# Patient Record
Sex: Male | Born: 1975 | Race: Black or African American | Hispanic: No | Smoking: Current every day smoker
Health system: Southern US, Community
[De-identification: ages and names within clinical notes are randomized; demographics above are authoritative.]

## PROBLEM LIST (undated history)

## (undated) DIAGNOSIS — E119 Type 2 diabetes mellitus without complications: Secondary | ICD-10-CM

## (undated) DIAGNOSIS — F39 Unspecified mood [affective] disorder: Secondary | ICD-10-CM

## (undated) DIAGNOSIS — C259 Malignant neoplasm of pancreas, unspecified: Secondary | ICD-10-CM

## (undated) DIAGNOSIS — K831 Obstruction of bile duct: Secondary | ICD-10-CM

## (undated) DIAGNOSIS — F32A Depression, unspecified: Secondary | ICD-10-CM

## (undated) DIAGNOSIS — F329 Major depressive disorder, single episode, unspecified: Secondary | ICD-10-CM

## (undated) HISTORY — DX: Unspecified mood (affective) disorder: F39

## (undated) HISTORY — PX: OTHER SURGICAL HISTORY: SHX169

## (undated) HISTORY — PX: CHOLECYSTECTOMY: SHX55

---

## 2004-12-21 ENCOUNTER — Emergency Department: Payer: Self-pay | Admitting: Internal Medicine

## 2005-01-30 ENCOUNTER — Emergency Department: Payer: Self-pay | Admitting: Emergency Medicine

## 2005-01-31 ENCOUNTER — Emergency Department: Payer: Self-pay | Admitting: Emergency Medicine

## 2006-12-23 ENCOUNTER — Emergency Department: Payer: Self-pay

## 2007-06-12 ENCOUNTER — Other Ambulatory Visit: Payer: Self-pay

## 2007-06-12 ENCOUNTER — Emergency Department: Payer: Self-pay | Admitting: Emergency Medicine

## 2009-04-19 ENCOUNTER — Emergency Department: Payer: Self-pay | Admitting: Emergency Medicine

## 2016-11-16 ENCOUNTER — Encounter: Payer: Self-pay | Admitting: Pulmonary Disease

## 2016-11-16 ENCOUNTER — Ambulatory Visit (INDEPENDENT_AMBULATORY_CARE_PROVIDER_SITE_OTHER): Payer: PRIVATE HEALTH INSURANCE | Admitting: Pulmonary Disease

## 2016-11-16 VITALS — BP 118/70 | HR 80 | Ht 68.0 in | Wt 204.8 lb

## 2016-11-16 DIAGNOSIS — R0683 Snoring: Secondary | ICD-10-CM | POA: Diagnosis not present

## 2016-11-16 NOTE — Patient Instructions (Signed)
Can look up theravent nasal patches Can try positional therapy for snoring Can ask your dentist about an oral appliance to treat snoring Can call our office if you need a referral to a dentist or ENT surgeon  Follow up as needed

## 2016-11-16 NOTE — Progress Notes (Signed)
   Subjective:    Patient ID: Travis Hawkins, male    DOB: 1975/07/24, 41 y.o.   MRN: [id number]  HPI    Review of Systems  Constitutional: Negative for fever and unexpected weight change.  HENT: Negative for congestion, dental problem, ear pain, nosebleeds, postnasal drip, rhinorrhea, sinus pressure, sneezing, sore throat and trouble swallowing.   Eyes: Negative for redness and itching.  Respiratory: Negative for cough, chest tightness, shortness of breath and wheezing.   Cardiovascular: Negative for palpitations and leg swelling.  Gastrointestinal: Negative for nausea and vomiting.  Genitourinary: Negative for dysuria.  Musculoskeletal: Negative for joint swelling.  Skin: Negative for rash.  Allergic/Immunologic: Negative.  Negative for environmental allergies, food allergies and immunocompromised state.  Neurological: Negative for headaches.  Hematological: Does not bruise/bleed easily.  Psychiatric/Behavioral: Positive for dysphoric mood. The patient is not nervous/anxious.        Objective:   Physical Exam        Assessment & Plan:

## 2016-11-16 NOTE — Progress Notes (Signed)
Past Surgical History He  has no past surgical history on file.  Not on File  Family History His family history includes Breast cancer in his maternal grandmother; Cancer in his maternal aunt; Lung cancer in his maternal grandfather.  Social History He  reports that he quit smoking about 2 years ago. His smoking use included Cigarettes. He has a 18.00 pack-year smoking history. He has never used smokeless tobacco. He reports that he does not use drugs.  Review of systems Constitutional: Negative for fever and unexpected weight change.  HENT: Negative for congestion, dental problem, ear pain, nosebleeds, postnasal drip, rhinorrhea, sinus pressure, sneezing, sore throat and trouble swallowing.   Eyes: Negative for redness and itching.  Respiratory: Negative for cough, chest tightness, shortness of breath and wheezing.   Cardiovascular: Negative for palpitations and leg swelling.  Gastrointestinal: Negative for nausea and vomiting.  Genitourinary: Negative for dysuria.  Musculoskeletal: Negative for joint swelling.  Skin: Negative for rash.  Allergic/Immunologic: Negative.  Negative for environmental allergies, food allergies and immunocompromised state.  Neurological: Negative for headaches.  Hematological: Does not bruise/bleed easily.  Psychiatric/Behavioral: Positive for dysphoric mood. The patient is not nervous/anxious.     No current outpatient prescriptions on file prior to visit.   No current facility-administered medications on file prior to visit.     Chief Complaint  Patient presents with  . Advice Only    Pt states that his fiance has been snoring and breathing heavily x3 years.    Past medical history He  has a past medical history of Mood disorder ([place]).  Vital signs BP 118/70 (BP Location: Right Arm, Patient Position: Sitting, Cuff Size: Normal)   Pulse 80   Ht 5\' 8"  (1.727 m)   Wt 204 lb 12.8 oz (92.9 kg)   SpO2 99%   BMI 31.14 kg/m   History of Present  Illness Travis Hawkins is a 41 y.o. male for evaluation of snoring.  His finance has been concerned about his snoring.  He doesn't feel like he has an issue with his sleep or his breathing while asleep.  He likes to sleep on his back.  He doesn't get dry mouth.  He denies sinus issues or allergies.  He doesn't have any trouble staying focused or awake during the day.  He goes to sleep at 12 am.  He falls asleep in 10 minutes.  He usually sleeps through the night, but sometimes gets up to use the bathroom.  He gets out of bed at 9 am.  He feels rested in the morning.  He denies morning headache.  He does not use anything to help him fall sleep or stay awake.  He denies sleep walking, sleep talking, bruxism, or nightmares.  There is no history of restless legs.  He denies sleep hallucinations, sleep paralysis, or cataplexy.  The Epworth score is 16 out of 24.  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   Physical Exam:  General - No distress ENT - No sinus tenderness, no oral exudate, no LAN, no thyromegaly, TM clear, pupils equal/reactive, MP 3, enlarged tongue, elongated uvula Cardiac - s1s2 regular, no murmur, pulses symmetric Chest - No wheeze/rales/dullness, good air entry, normal respiratory excursion Back - No focal tenderness Abd - Soft, non-tender, no organomegaly, + bowel sounds Ext - No edema Neuro - Normal strength, cranial nerves intact Skin - No rashes Psych - Normal mood, and behavior  Discussion: His fiance has been concerned about his snoring.  He doesn't feel like he has an issue with his sleep.  Assessment/plan:  Snoring. - discussed techniques to help with snoring >> positional therapy, oral appliances, theravent, surgical assessment - explained that insurance would not likely cover therapy w/o a formal diagnosis of sleep apnea - he does not think he has a significant sleep issue and would  like to defer additional sleep testing at this time - discussed how sleeping in separate rooms to ensure adequate sleep for both members of relation can actually better to maintain the health of a relationship    Patient Instructions  Can look up theravent nasal patches Can try positional therapy for snoring Can ask your dentist about an oral appliance to treat snoring Can call our office if you need a referral to a dentist or ENT surgeon  Follow up as needed    Chesley Mires, M.D. Pager [phone] 11/16/2016, 4:45 PM

## 2017-03-08 ENCOUNTER — Encounter: Payer: Self-pay | Admitting: *Deleted

## 2017-03-08 ENCOUNTER — Other Ambulatory Visit: Payer: Self-pay

## 2017-03-08 DIAGNOSIS — K851 Biliary acute pancreatitis without necrosis or infection: Secondary | ICD-10-CM | POA: Diagnosis present

## 2017-03-08 DIAGNOSIS — Z6831 Body mass index (BMI) 31.0-31.9, adult: Secondary | ICD-10-CM

## 2017-03-08 DIAGNOSIS — E669 Obesity, unspecified: Secondary | ICD-10-CM | POA: Diagnosis present

## 2017-03-08 DIAGNOSIS — Z833 Family history of diabetes mellitus: Secondary | ICD-10-CM

## 2017-03-08 DIAGNOSIS — L299 Pruritus, unspecified: Secondary | ICD-10-CM | POA: Diagnosis present

## 2017-03-08 DIAGNOSIS — Z87891 Personal history of nicotine dependence: Secondary | ICD-10-CM

## 2017-03-08 DIAGNOSIS — K8051 Calculus of bile duct without cholangitis or cholecystitis with obstruction: Principal | ICD-10-CM | POA: Diagnosis present

## 2017-03-08 DIAGNOSIS — F39 Unspecified mood [affective] disorder: Secondary | ICD-10-CM | POA: Diagnosis present

## 2017-03-08 DIAGNOSIS — R634 Abnormal weight loss: Secondary | ICD-10-CM | POA: Diagnosis present

## 2017-03-08 DIAGNOSIS — R7301 Impaired fasting glucose: Secondary | ICD-10-CM | POA: Diagnosis present

## 2017-03-08 DIAGNOSIS — N400 Enlarged prostate without lower urinary tract symptoms: Secondary | ICD-10-CM | POA: Diagnosis present

## 2017-03-08 LAB — COMPREHENSIVE METABOLIC PANEL
ALBUMIN: 4 g/dL (ref 3.5–5.0)
ALK PHOS: 258 U/L — AB (ref 38–126)
ALT: 347 U/L — AB (ref 17–63)
AST: 144 U/L — ABNORMAL HIGH (ref 15–41)
Anion gap: 10 (ref 5–15)
BUN: 9 mg/dL (ref 6–20)
CALCIUM: 9.3 mg/dL (ref 8.9–10.3)
CO2: 21 mmol/L — AB (ref 22–32)
CREATININE: 0.65 mg/dL (ref 0.61–1.24)
Chloride: 104 mmol/L (ref 101–111)
GFR calc Af Amer: >60 mL/min (ref >60)
GFR calc non Af Amer: >60 mL/min (ref >60)
GLUCOSE: 131 mg/dL — AB (ref 65–99)
Potassium: 3.8 mmol/L (ref 3.5–5.1)
SODIUM: 135 mmol/L (ref 135–145)
Total Bilirubin: 10.2 mg/dL — ABNORMAL HIGH (ref 0.3–1.2)
Total Protein: 8.1 g/dL (ref 6.5–8.1)

## 2017-03-08 LAB — URINALYSIS, COMPLETE (UACMP) WITH MICROSCOPIC
Bacteria, UA: NONE SEEN
Bilirubin Urine: NEGATIVE
GLUCOSE, UA: NEGATIVE mg/dL
Ketones, ur: NEGATIVE mg/dL
Leukocytes, UA: NEGATIVE
Nitrite: NEGATIVE
PROTEIN: NEGATIVE mg/dL
Specific Gravity, Urine: 1.005 (ref 1.005–1.030)
Squamous Epithelial / LPF: NONE SEEN
pH: 6 (ref 5.0–8.0)

## 2017-03-08 LAB — CBC
HCT: 41.5 % (ref 40.0–52.0)
Hemoglobin: 13.9 g/dL (ref 13.0–18.0)
MCH: 32.7 pg (ref 26.0–34.0)
MCHC: 33.6 g/dL (ref 32.0–36.0)
MCV: 97.4 fL (ref 80.0–100.0)
PLATELETS: 220 10*3/uL (ref 150–440)
RBC: 4.26 MIL/uL — ABNORMAL LOW (ref 4.40–5.90)
RDW: 15.5 % — AB (ref 11.5–14.5)
WBC: 6.5 10*3/uL (ref 3.8–10.6)

## 2017-03-08 LAB — LIPASE, BLOOD: Lipase: 619 U/L — ABNORMAL HIGH (ref 11–51)

## 2017-03-08 NOTE — ED Triage Notes (Signed)
Pt to ED reporting multiple complaints. Pt reports having dark urine with noted blood at times. Pt reports he has had generalized abd pain and increased number of BM for the past month with weakness and nausea. pT reports he has become very sensitive to smells and over the past month will become nauseous with strong smells. Pt also report shaving itching skin that worsens at night. Only change int he last month that pt reports is a change from brand name Zyprexa to generic a month ago.

## 2017-03-09 ENCOUNTER — Emergency Department: Payer: Self-pay

## 2017-03-09 ENCOUNTER — Inpatient Hospital Stay
Admission: EM | Admit: 2017-03-09 | Discharge: 2017-03-12 | DRG: 444 | Disposition: A | Discharge status: Home / Self Care | Payer: PRIVATE HEALTH INSURANCE | Attending: Internal Medicine | Admitting: Internal Medicine

## 2017-03-09 ENCOUNTER — Inpatient Hospital Stay: Payer: Self-pay

## 2017-03-09 ENCOUNTER — Other Ambulatory Visit: Payer: Self-pay

## 2017-03-09 DIAGNOSIS — R945 Abnormal results of liver function studies: Secondary | ICD-10-CM

## 2017-03-09 DIAGNOSIS — K831 Obstruction of bile duct: Secondary | ICD-10-CM

## 2017-03-09 DIAGNOSIS — K8050 Calculus of bile duct without cholangitis or cholecystitis without obstruction: Secondary | ICD-10-CM

## 2017-03-09 DIAGNOSIS — K838 Other specified diseases of biliary tract: Secondary | ICD-10-CM

## 2017-03-09 DIAGNOSIS — R17 Unspecified jaundice: Secondary | ICD-10-CM

## 2017-03-09 DIAGNOSIS — R7989 Other specified abnormal findings of blood chemistry: Secondary | ICD-10-CM

## 2017-03-09 DIAGNOSIS — R748 Abnormal levels of other serum enzymes: Secondary | ICD-10-CM

## 2017-03-09 LAB — COMPREHENSIVE METABOLIC PANEL
ALT: 325 U/L — ABNORMAL HIGH (ref 17–63)
AST: 136 U/L — AB (ref 15–41)
Albumin: 3.9 g/dL (ref 3.5–5.0)
Alkaline Phosphatase: 252 U/L — ABNORMAL HIGH (ref 38–126)
Anion gap: 9 (ref 5–15)
BUN: 8 mg/dL (ref 6–20)
CHLORIDE: 105 mmol/L (ref 101–111)
CO2: 22 mmol/L (ref 22–32)
Calcium: 9.1 mg/dL (ref 8.9–10.3)
Creatinine, Ser: 0.72 mg/dL (ref 0.61–1.24)
GFR calc Af Amer: >60 mL/min (ref >60)
Glucose, Bld: 168 mg/dL — ABNORMAL HIGH (ref 65–99)
POTASSIUM: 3.7 mmol/L (ref 3.5–5.1)
Sodium: 136 mmol/L (ref 135–145)
Total Bilirubin: 10.2 mg/dL — ABNORMAL HIGH (ref 0.3–1.2)
Total Protein: 7.7 g/dL (ref 6.5–8.1)

## 2017-03-09 LAB — CBC
HCT: 40.5 % (ref 40.0–52.0)
Hemoglobin: 13.8 g/dL (ref 13.0–18.0)
MCH: 33 pg (ref 26.0–34.0)
MCHC: 34.1 g/dL (ref 32.0–36.0)
MCV: 96.9 fL (ref 80.0–100.0)
PLATELETS: 212 10*3/uL (ref 150–440)
RBC: 4.18 MIL/uL — ABNORMAL LOW (ref 4.40–5.90)
RDW: 15.4 % — AB (ref 11.5–14.5)
WBC: 5.6 10*3/uL (ref 3.8–10.6)

## 2017-03-09 LAB — BILIRUBIN, DIRECT: BILIRUBIN DIRECT: 6.3 mg/dL — AB (ref 0.1–0.5)

## 2017-03-09 LAB — PROTIME-INR
INR: 1.03
PROTHROMBIN TIME: 13.4 s (ref 11.4–15.2)

## 2017-03-09 LAB — APTT: APTT: 31 s (ref 24–36)

## 2017-03-09 MED ORDER — SODIUM CHLORIDE 0.9% FLUSH
3.0000 mL | INTRAVENOUS | Status: DC | PRN
  Administered 2017-03-09 (×2): 3 mL via INTRAVENOUS
  Filled 2017-03-09 (×2): qty 3

## 2017-03-09 MED ORDER — MORPHINE SULFATE (PF) 2 MG/ML IV SOLN
2.0000 mg | INTRAVENOUS | Status: DC | PRN
  Administered 2017-03-09 – 2017-03-12 (×2): 2 mg via INTRAVENOUS
  Filled 2017-03-09: qty 1

## 2017-03-09 MED ORDER — ONDANSETRON HCL 4 MG PO TABS: 4.0000 mg | ORAL_TABLET | Freq: Four times a day (QID) | ORAL | Status: DC | PRN

## 2017-03-09 MED ORDER — OLANZAPINE 10 MG PO TABS
10.0000 mg | ORAL_TABLET | Freq: Every day | ORAL | Status: DC
  Administered 2017-03-09 – 2017-03-11 (×3): 10 mg via ORAL
  Filled 2017-03-09 (×4): qty 1

## 2017-03-09 MED ORDER — GADOBENATE DIMEGLUMINE 529 MG/ML IV SOLN
18.0000 mL | Freq: Once | INTRAVENOUS | Status: AC | PRN
  Administered 2017-03-09: 18 mL via INTRAVENOUS

## 2017-03-09 MED ORDER — LAMOTRIGINE 25 MG PO TABS
50.0000 mg | ORAL_TABLET | Freq: Every day | ORAL | Status: DC
  Administered 2017-03-09 – 2017-03-11 (×3): 50 mg via ORAL
  Filled 2017-03-09 (×3): qty 2

## 2017-03-09 MED ORDER — SODIUM CHLORIDE 0.9% FLUSH
3.0000 mL | Freq: Two times a day (BID) | INTRAVENOUS | Status: DC
  Administered 2017-03-09 – 2017-03-11 (×2): 3 mL via INTRAVENOUS

## 2017-03-09 MED ORDER — HYDROXYZINE HCL 25 MG PO TABS
25.0000 mg | ORAL_TABLET | Freq: Three times a day (TID) | ORAL | Status: DC | PRN
  Administered 2017-03-09 – 2017-03-11 (×4): 25 mg via ORAL
  Filled 2017-03-09 (×4): qty 1

## 2017-03-09 MED ORDER — OXYCODONE HCL 5 MG PO TABS
5.0000 mg | ORAL_TABLET | ORAL | Status: DC | PRN
  Administered 2017-03-11: 5 mg via ORAL
  Filled 2017-03-09: qty 1

## 2017-03-09 MED ORDER — IOPAMIDOL (ISOVUE-300) INJECTION 61%
100.0000 mL | Freq: Once | INTRAVENOUS | Status: AC | PRN
  Administered 2017-03-09: 100 mL via INTRAVENOUS

## 2017-03-09 MED ORDER — ONDANSETRON HCL 4 MG/2ML IJ SOLN
4.0000 mg | Freq: Four times a day (QID) | INTRAMUSCULAR | Status: DC | PRN
  Administered 2017-03-12: 4 mg via INTRAVENOUS
  Filled 2017-03-09: qty 2

## 2017-03-09 MED ORDER — SODIUM CHLORIDE 0.9 % IV SOLN
INTRAVENOUS | Status: DC
  Administered 2017-03-09 – 2017-03-12 (×6): via INTRAVENOUS

## 2017-03-09 MED ORDER — SODIUM CHLORIDE 0.9 % IV SOLN: 250.0000 mL | INTRAVENOUS | Status: DC | PRN

## 2017-03-09 MED ORDER — MORPHINE SULFATE (PF) 2 MG/ML IV SOLN
INTRAVENOUS | Status: AC
  Filled 2017-03-09: qty 1

## 2017-03-09 MED ORDER — DOCUSATE SODIUM 100 MG PO CAPS
100.0000 mg | ORAL_CAPSULE | Freq: Two times a day (BID) | ORAL | Status: DC
  Administered 2017-03-09 – 2017-03-11 (×4): 100 mg via ORAL
  Filled 2017-03-09 (×5): qty 1

## 2017-03-09 MED ORDER — ENOXAPARIN SODIUM 40 MG/0.4ML ~~LOC~~ SOLN
40.0000 mg | SUBCUTANEOUS | Status: DC
  Administered 2017-03-09 – 2017-03-10 (×2): 40 mg via SUBCUTANEOUS
  Filled 2017-03-09 (×2): qty 0.4

## 2017-03-09 NOTE — ED Notes (Signed)
ED Provider at bedside. 

## 2017-03-09 NOTE — ED Notes (Signed)
Patient transported to CT 

## 2017-03-09 NOTE — Consult Note (Addendum)
Travis Hawkins , MD [address] [place], [place], [place], [address] [address], [place], [place], [place], [address] Phone: [phone]  Fax: [phone]  Consultation  Referring Provider:   Dr Karma Greaser Primary Care Physician:  Patient, No Pcp Per Primary Gastroenterologist: None          Reason for Consultation:     Abnormal LFT's   Date of Admission:  03/09/2017 Date of Consultation:  03/09/2017         HPI:   Travis Hawkins is a 41 y.o. male presented to the ER last night with abdominal pain nausea and vomiting.  He also noted that he was turning yellow gradually with itching and that his urine has been turning gradually darker in color.  In the ER he initially had a ultrasound of his right upper quadrant which demonstrated no gallbladder stones but a dilated gallbladder and marked intrahepatic and extrahepatic biliary dilation was noted.  He subsequently had a CAT scan which showed his common bile duct was dilated to 15 mm to the level of the pancreatic head dilation of the intrahepatic bile ducts gallbladder distention and a mildly enlarged prostate.  Dr. Marijean Bravo back from the ER discussed his case with me last night and I suggested that he be admitted and obtain an MRCP in the morning which he subsequently did and again shows intrahepatic and extrahepatic biliary dilation without any identifiable cause, the pancreas appears unremarkable.  On admission his INR was 1.03 had an elevated bilirubin direct of 6.3 hemoglobin is 13.8 g with a platelet count of 212.  His AST and ALT are elevated at 136 and 325 respectively he does have an elevated alkaline phosphatase of 252 creatinine is normal at 0.72  Over-the-counter medications: NO  Herbal supplements: NO  Family history of liver disease: NO  Alcohol: NO  Illegal drug use: NO   He says over the last 1 month has been turning yellow, dark urine last 3 weeks,regular brown colored stool, itching last few weeks, 10 lbs weight loss,  denies any abdominal pain says its more of a sensation that he wants to have a bowel movement allthe time .   Past Medical History:  Diagnosis Date  . Mood disorder [facility])     Past Surgical History:  Procedure Laterality Date  . none      Prior to Admission medications   Medication Sig Start Date End Date Taking? Authorizing Provider  lamoTRIgine (LAMICTAL) 25 MG tablet Take 25 mg by mouth at bedtime. Two tabs at bedtime.   Yes [provider]  OLANZapine (ZYPREXA) 10 MG tablet Take 10 mg by mouth at bedtime.   Yes [provider]    Family History  Problem Relation Age of Onset  . Cancer Maternal Aunt   . Breast cancer Maternal Grandmother   . Lung cancer Maternal Grandfather   . Diabetes Mellitus II Mother      Social History   Tobacco Use  . Smoking status: Former Smoker    Packs/day: 1.00    Years: 18.00    Pack years: 18.00    Types: Cigarettes    Last attempt to quit: 11/17/2014    Years since quitting: 2.3  . Smokeless tobacco: Never Used  Substance Use Topics  . Alcohol use: Yes    Comment: Occasional wine about every 3-4 months  . Drug use: No    Allergies as of 03/08/2017  . (Not on File)    Review of Systems:  All systems reviewed and negative except where noted in HPI.   Physical Exam:  Vital signs in last 24 hours: Temp:  [97.5 F (36.4 C)-98.3 F (36.8 C)] 97.5 F (36.4 C) (11/10 0750) Pulse Rate:  [62-70] 69 (11/10 0451) Resp:  [14-17] 16 (11/10 0750) BP: (108-131)/(54-90) 114/68 (11/10 0750) SpO2:  [96 %-100 %] 100 % (11/10 0750) Weight:  [199 lb 1.6 oz (90.3 kg)-204 lb (92.5 kg)] 199 lb 1.6 oz (90.3 kg) (11/10 0454) Last BM Date: 03/09/17 General:   Pleasant, cooperative in NAD Head:  Normocephalic and atraumatic. Eyes:   +++icterus.   Conjunctiva pink. PERRLA. Ears:  Normal auditory acuity. Neck:  Supple; no masses or thyroidomegaly Lungs: Respirations even and unlabored. Lungs clear to auscultation bilaterally.    No wheezes, crackles, or rhonchi.  Heart:  Regular rate and rhythm;  Without murmur, clicks, rubs or gallops Abdomen:  Soft, nondistended, nontender. Normal bowel sounds. No appreciable masses or hepatomegaly.  No rebound or guarding.  Neurologic:  Alert and oriented x3;  grossly normal neurologically. Skin:  Intact without significant lesions or rashes. Cervical Nodes:  No significant cervical adenopathy. Psych:  Alert and cooperative. Normal affect.  LAB RESULTS: Recent Labs    03/08/17 2233 03/09/17 0516  WBC 6.5 5.6  HGB 13.9 13.8  HCT 41.5 40.5  PLT 220 212   BMET Recent Labs    03/08/17 2233 03/09/17 0516  NA 135 136  K 3.8 3.7  CL 104 105  CO2 21* 22  GLUCOSE 131* 168*  BUN 9 8  CREATININE 0.65 0.72  CALCIUM 9.3 9.1   LFT Recent Labs    03/09/17 0106 03/09/17 0516  PROT  --  7.7  ALBUMIN  --  3.9  AST  --  136*  ALT  --  325*  ALKPHOS  --  252*  BILITOT  --  10.2*  BILIDIR 6.3*  --    PT/INR Recent Labs    03/09/17 0106  LABPROT 13.4  INR 1.03    STUDIES: Ct Abdomen Pelvis W Contrast  Result Date: 03/09/2017 CLINICAL DATA:  Chronic generalized abdominal pain and nausea. Lightheadedness and generalized weakness. Itchiness. EXAM: CT ABDOMEN AND PELVIS WITH CONTRAST TECHNIQUE: Multidetector CT imaging of the abdomen and pelvis was performed using the standard protocol following bolus administration of intravenous contrast. CONTRAST:  152mL ISOVUE-300 IOPAMIDOL (ISOVUE-300) INJECTION 61% COMPARISON:  Right upper quadrant ultrasound performed earlier today at 1:25 a.m. FINDINGS: Lower chest: The visualized lung bases are grossly clear. The visualized portions of the mediastinum are unremarkable. Hepatobiliary: There is dilatation of the common bile duct to 1.5 cm in diameter, to the level of the pancreatic head. This may reflect an underlying obstructing stone, though mass cannot be excluded. There is diffuse prominence of the intrahepatic biliary ducts. The  gallbladder is somewhat distended but otherwise grossly unremarkable. The liver is otherwise unremarkable in appearance. Pancreas: The pancreas is otherwise grossly unremarkable. Spleen: The spleen is unremarkable in appearance. Adrenals/Urinary Tract: The adrenal glands are unremarkable in appearance. The kidneys are within normal limits. There is no evidence of hydronephrosis. No renal or ureteral stones are identified. No perinephric stranding is seen. Stomach/Bowel: The stomach is unremarkable in appearance. The small bowel is within normal limits. The appendix is normal in caliber, without evidence of appendicitis. The colon is unremarkable in appearance. Vascular/Lymphatic: The abdominal aorta is unremarkable in appearance. The inferior vena cava is grossly unremarkable. No retroperitoneal lymphadenopathy is seen. No pelvic sidewall lymphadenopathy is identified. Reproductive:  The bladder is mildly distended and grossly unremarkable. The prostate is mildly enlarged, measuring 5.1 cm in transverse dimension. Other: No additional soft tissue abnormalities are seen. Musculoskeletal: No acute osseous abnormalities are identified. The visualized musculature is unremarkable in appearance. IMPRESSION: 1. Dilatation of the common bile duct to 1.5 cm in diameter, to the level of the pancreatic head. Diffuse prominence of the intrahepatic biliary ducts and mild gallbladder distention. This is concerning for underlying obstructing stone, though underlying mass cannot be excluded. ERCP or MRCP is recommended for further evaluation. 2. Mildly enlarged prostate. Electronically Signed   By: Garald Balding M.D.   On: 03/09/2017 03:00   Mr 3d Recon At Scanner  Result Date: 03/09/2017 CLINICAL DATA:  Biliary dilatation on recent CT scan. EXAM: MRI ABDOMEN WITHOUT AND WITH CONTRAST (INCLUDING MRCP) TECHNIQUE: Multiplanar multisequence MR imaging of the abdomen was performed both before and after the administration of  intravenous contrast. Heavily T2-weighted images of the biliary and pancreatic ducts were obtained, and three-dimensional MRCP images were rendered by post processing. CONTRAST:  18 cc MultiHance COMPARISON:  CT scan 03/09/2018 FINDINGS: Lower chest: The lung bases are grossly clear. No pleural or pericardial effusion. Hepatobiliary: As demonstrated on the CT scan there is moderate intra and extrahepatic biliary dilatation with the common bile duct measuring up to 13.5 mm in the head of the pancreas. No obstructing common bile duct stones are identified. There is some layering debris dependently in the common bile duct which demonstrates high T1 signal intensity and is likely cholesterol laden bile or sludge. No hepatic lesions. No ampullary lesion or pancreatic head mass. The cystic duct is also mildly dilated as is the gallbladder. No definite gallstones or findings for acute cholecystitis. Pancreas: No mass, inflammation or ductal dilatation. Normal caliber and course of the main pancreatic duct. Spleen:  Normal size.  No focal lesions. Adrenals/Urinary Tract:  The adrenal glands and kidneys are normal. Stomach/Bowel: Visualized portions within the abdomen are unremarkable. Vascular/Lymphatic: No pathologically enlarged lymph nodes identified. No abdominal aortic aneurysm demonstrated. Other:  No ascites or abdominal wall hernia. Musculoskeletal: No significant bony findings. IMPRESSION: 1. Intra and extrahepatic biliary dilatation without identifiable cause. Suspect common bile duct stricture. No definite common bile duct stones and no ampullary lesion or pancreatic head mass. Recommend ERCP for further evaluation and treatment. 2. Normal caliber and course of the main pancreatic duct. 3. No other significant abdominal findings. Electronically Signed   By: Marijo Sanes M.D.   On: 03/09/2017 10:42   Mr Abdomen Mrcp Moise Boring Contast  Result Date: 03/09/2017 CLINICAL DATA:  Biliary dilatation on recent CT scan.  EXAM: MRI ABDOMEN WITHOUT AND WITH CONTRAST (INCLUDING MRCP) TECHNIQUE: Multiplanar multisequence MR imaging of the abdomen was performed both before and after the administration of intravenous contrast. Heavily T2-weighted images of the biliary and pancreatic ducts were obtained, and three-dimensional MRCP images were rendered by post processing. CONTRAST:  18 cc MultiHance COMPARISON:  CT scan 03/09/2018 FINDINGS: Lower chest: The lung bases are grossly clear. No pleural or pericardial effusion. Hepatobiliary: As demonstrated on the CT scan there is moderate intra and extrahepatic biliary dilatation with the common bile duct measuring up to 13.5 mm in the head of the pancreas. No obstructing common bile duct stones are identified. There is some layering debris dependently in the common bile duct which demonstrates high T1 signal intensity and is likely cholesterol laden bile or sludge. No hepatic lesions. No ampullary lesion or pancreatic head mass. The cystic  duct is also mildly dilated as is the gallbladder. No definite gallstones or findings for acute cholecystitis. Pancreas: No mass, inflammation or ductal dilatation. Normal caliber and course of the main pancreatic duct. Spleen:  Normal size.  No focal lesions. Adrenals/Urinary Tract:  The adrenal glands and kidneys are normal. Stomach/Bowel: Visualized portions within the abdomen are unremarkable. Vascular/Lymphatic: No pathologically enlarged lymph nodes identified. No abdominal aortic aneurysm demonstrated. Other:  No ascites or abdominal wall hernia. Musculoskeletal: No significant bony findings. IMPRESSION: 1. Intra and extrahepatic biliary dilatation without identifiable cause. Suspect common bile duct stricture. No definite common bile duct stones and no ampullary lesion or pancreatic head mass. Recommend ERCP for further evaluation and treatment. 2. Normal caliber and course of the main pancreatic duct. 3. No other significant abdominal findings.  Electronically Signed   By: Marijo Sanes M.D.   On: 03/09/2017 10:42   US Abdomen Limited Ruq  Result Date: 03/09/2017 CLINICAL DATA:  Abdomen pain with elevated bilirubin EXAM: ULTRASOUND ABDOMEN LIMITED RIGHT UPPER QUADRANT COMPARISON:  None. FINDINGS: Gallbladder: Enlarged gallbladder. No shadowing stones or sludge. Negative sonographic Murphy. Upper normal wall thickness at 2.9 mm Common bile duct: Diameter: Enlarged measuring up to 11 mm Liver: Marked intra hepatic biliary dilatation. Coarse increased echogenicity. Portal vein is patent on color Doppler imaging with normal direction of blood flow towards the liver. IMPRESSION: 1. No shadowing stones. 2. Dilated gallbladder. Marked intra and extra hepatic biliary dilatation ; CT abdomen and pelvis could be obtained if concern for obstructing lesion at the pancreas 3. Coarse increased liver echogenicity suggesting Please pick the correct US ABDOMEN LIMITED template. Electronically Signed   By: Donavan Foil M.D.   On: 03/09/2017 02:03      Impression / Plan:   Travis Hawkins is a 41 y.o. y/o male  has been admitted with obstructive jaundice.  Imaging including a CAT scan and MRCP showed no clear identifiable blockage.  But it is obvious that his there is some form of blockage at the common bile duct is dilated.  The differentials at this point would include papillary stenosis, ampullary adenoma, a stone in the distal aspect of the common bile duct probably less likely though.   Plan: 1.  ERCP on Monday 2.  n.p.o. from Sunday midnight 3.  If develops fever worsening of mental status or features of SIRS call me immediately and would require immediate ERCP.  I have discussed alternative options, risks & benefits,  which include, but are not limited to, bleeding, infection, perforation,respiratory complication & drug reaction.  The patient agrees with this plan & written consent will be obtained.    Thank you for involving me in the care of this  patient.      LOS: 0 days   Travis Bellows, MD  03/09/2017, 10:53 AM

## 2017-03-09 NOTE — ED Notes (Signed)
[facility] EDT to transport patient

## 2017-03-09 NOTE — ED Notes (Signed)
Pt c/o abdominal pain x 1 month with nausea and having to "run to the bathroom 5-6 times per day." Pt denies diarrhea at this time, but reports increase in frequency. Pt also c/o darker urine over the past month. Pt states he has been light-headed and weak for the last several weeks, although worse this week. Pt also reports excessive itchy skin "all over." To this RN, pt appears to have yellowish-tinted eyes, which pt and family deny noticing at this point.

## 2017-03-09 NOTE — H&P (Signed)
[facility] [place] NAME: Travis Hawkins    MR#:  [id number]  DATE OF BIRTH:  02-22-1976  DATE OF ADMISSION:  03/09/2017  PRIMARY CARE PHYSICIAN: Patient, No Pcp Per   REQUESTING/REFERRING PHYSICIAN:   CHIEF COMPLAINT:   Chief Complaint  Patient presents with  . Abdominal Pain    HISTORY OF PRESENT ILLNESS: Travis Hawkins  is a 41 y.o. male with a known history of mood disorder presented to the emergency room with abdominal pain for the last 1 week. The abdominal pain has been there for 1 month but has been intense for the last 1 week. He also has some nausea and vomiting. Patient also has dark urine and itching of the skin. He also has a yellowish discoloration of the eyes. Abdominal pain is aching in nature located around the umbilicus and right upper quadrant area. Head is 6 out of 10 on a scale of 1-10. He was evaluated in the emergency room his liver function tests and bilirubin were elevated. Patient was worked up with CT abdomen which showed the dilatation patient of intrahepatic biliary ducts. Hospitalist service was consulted. Case was discussed by ER physician with gastroenterologist to call who recommended MRCP abdomen and possible ERCP.   PAST MEDICAL HISTORY:   Past Medical History:  Diagnosis Date  . Mood disorder ([place])     PAST SURGICAL HISTORY:  Past Surgical History:  Procedure Laterality Date  . none      SOCIAL HISTORY:  Social History   Tobacco Use  . Smoking status: Former Smoker    Packs/day: 1.00    Years: 18.00    Pack years: 18.00    Types: Cigarettes    Last attempt to quit: 11/17/2014    Years since quitting: 2.3  . Smokeless tobacco: Never Used  Substance Use Topics  . Alcohol use: Yes    Comment: Occasional wine about every 3-4 months    FAMILY HISTORY:  Family History  Problem Relation Age of Onset  . Cancer Maternal Aunt   . Breast cancer Maternal Grandmother   . Lung cancer Maternal  Grandfather   . Diabetes Mellitus II Mother     DRUG ALLERGIES: Not on File  REVIEW OF SYSTEMS:   CONSTITUTIONAL: No fever, fatigue or weakness.  Has itching of skin. EYES: No blurred or double vision.  EARS, NOSE, AND THROAT: No tinnitus or ear pain.  RESPIRATORY: No cough, shortness of breath, wheezing or hemoptysis.  CARDIOVASCULAR: No chest pain, orthopnea, edema.  GASTROINTESTINAL: Has nausea, vomiting, abdominal pain.  Has diarrhea. GENITOURINARY: No dysuria, hematuria.  Has dark urine. ENDOCRINE: No polyuria, nocturia,  HEMATOLOGY: No anemia, easy bruising or bleeding SKIN: No rash or lesion. MUSCULOSKELETAL: No joint pain or arthritis.   NEUROLOGIC: No tingling, numbness, weakness.  PSYCHIATRY: No anxiety or depression.   MEDICATIONS AT HOME:  Prior to Admission medications   Medication Sig Start Date End Date Taking? Authorizing Provider  lamoTRIgine (LAMICTAL) 25 MG tablet Take 25 mg by mouth at bedtime. Two tabs at bedtime.   Yes [provider]  OLANZapine (ZYPREXA) 10 MG tablet Take 10 mg by mouth at bedtime.   Yes [provider]      PHYSICAL EXAMINATION:   VITAL SIGNS: Blood pressure 115/75, pulse 70, temperature 98.3 F (36.8 C), temperature source Oral, resp. rate 14, height 5\' 8"  (1.727 m), weight 92.5 kg (204 lb), SpO2 99 %.  GENERAL:  41 y.o.-year-old patient lying in  the bed with no acute distress.  EYES: Pupils equal, round, reactive to light and accommodation. Has scleral icterus. Extraocular muscles intact.  HEENT: Head atraumatic, normocephalic. Oropharynx and nasopharynx clear.  NECK:  Supple, no jugular venous distention. No thyroid enlargement, no tenderness.  LUNGS: Normal breath sounds bilaterally, no wheezing, rales,rhonchi or crepitation. No use of accessory muscles of respiration.  CARDIOVASCULAR: S1, S2 normal. No murmurs, rubs, or gallops.  ABDOMEN: Soft, mild tenderness right upper quadrant, nondistended. Bowel sounds  present. No organomegaly or mass.  EXTREMITIES: No pedal edema, cyanosis, or clubbing.  NEUROLOGIC: Cranial nerves II through XII are intact. Muscle strength 5/5 in all extremities. Sensation intact. Gait not checked.  PSYCHIATRIC: The patient is alert and oriented x 3.  SKIN: No obvious rash, lesion, or ulcer.   LABORATORY PANEL:   CBC Recent Labs  Lab 03/08/17 2233  WBC 6.5  HGB 13.9  HCT 41.5  PLT 220  MCV 97.4  MCH 32.7  MCHC 33.6  RDW 15.5*   ------------------------------------------------------------------------------------------------------------------  Chemistries  Recent Labs  Lab 03/08/17 2233  NA 135  K 3.8  CL 104  CO2 21*  GLUCOSE 131*  BUN 9  CREATININE 0.65  CALCIUM 9.3  AST 144*  ALT 347*  ALKPHOS 258*  BILITOT 10.2*   ------------------------------------------------------------------------------------------------------------------ estimated creatinine clearance is 132.7 mL/min (by C-G formula based on SCr of 0.65 mg/dL). ------------------------------------------------------------------------------------------------------------------ No results for input(s): TSH, T4TOTAL, T3FREE, THYROIDAB in the last 72 hours.  Invalid input(s): FREET3   Coagulation profile Recent Labs  Lab 03/09/17 0106  INR 1.03   ------------------------------------------------------------------------------------------------------------------- No results for input(s): DDIMER in the last 72 hours. -------------------------------------------------------------------------------------------------------------------  Cardiac Enzymes No results for input(s): CKMB, TROPONINI, MYOGLOBIN in the last 168 hours.  Invalid input(s): CK ------------------------------------------------------------------------------------------------------------------ Invalid input(s):  POCBNP  ---------------------------------------------------------------------------------------------------------------  Urinalysis    Component Value Date/Time   COLORURINE AMBER (A) 03/08/2017 2233   APPEARANCEUR CLEAR (A) 03/08/2017 2233   LABSPEC 1.005 03/08/2017 2233   PHURINE 6.0 03/08/2017 2233   GLUCOSEU NEGATIVE 03/08/2017 2233   HGBUR SMALL (A) 03/08/2017 2233   BILIRUBINUR NEGATIVE 03/08/2017 2233   KETONESUR NEGATIVE 03/08/2017 2233   PROTEINUR NEGATIVE 03/08/2017 2233   NITRITE NEGATIVE 03/08/2017 2233   LEUKOCYTESUR NEGATIVE 03/08/2017 2233     RADIOLOGY: Ct Abdomen Pelvis W Contrast  Result Date: 03/09/2017 CLINICAL DATA:  Chronic generalized abdominal pain and nausea. Lightheadedness and generalized weakness. Itchiness. EXAM: CT ABDOMEN AND PELVIS WITH CONTRAST TECHNIQUE: Multidetector CT imaging of the abdomen and pelvis was performed using the standard protocol following bolus administration of intravenous contrast. CONTRAST:  173mL ISOVUE-300 IOPAMIDOL (ISOVUE-300) INJECTION 61% COMPARISON:  Right upper quadrant ultrasound performed earlier today at 1:25 a.m. FINDINGS: Lower chest: The visualized lung bases are grossly clear. The visualized portions of the mediastinum are unremarkable. Hepatobiliary: There is dilatation of the common bile duct to 1.5 cm in diameter, to the level of the pancreatic head. This may reflect an underlying obstructing stone, though mass cannot be excluded. There is diffuse prominence of the intrahepatic biliary ducts. The gallbladder is somewhat distended but otherwise grossly unremarkable. The liver is otherwise unremarkable in appearance. Pancreas: The pancreas is otherwise grossly unremarkable. Spleen: The spleen is unremarkable in appearance. Adrenals/Urinary Tract: The adrenal glands are unremarkable in appearance. The kidneys are within normal limits. There is no evidence of hydronephrosis. No renal or ureteral stones are identified. No  perinephric stranding is seen. Stomach/Bowel: The stomach is unremarkable in appearance. The small bowel is within normal limits.  The appendix is normal in caliber, without evidence of appendicitis. The colon is unremarkable in appearance. Vascular/Lymphatic: The abdominal aorta is unremarkable in appearance. The inferior vena cava is grossly unremarkable. No retroperitoneal lymphadenopathy is seen. No pelvic sidewall lymphadenopathy is identified. Reproductive: The bladder is mildly distended and grossly unremarkable. The prostate is mildly enlarged, measuring 5.1 cm in transverse dimension. Other: No additional soft tissue abnormalities are seen. Musculoskeletal: No acute osseous abnormalities are identified. The visualized musculature is unremarkable in appearance. IMPRESSION: 1. Dilatation of the common bile duct to 1.5 cm in diameter, to the level of the pancreatic head. Diffuse prominence of the intrahepatic biliary ducts and mild gallbladder distention. This is concerning for underlying obstructing stone, though underlying mass cannot be excluded. ERCP or MRCP is recommended for further evaluation. 2. Mildly enlarged prostate. Electronically Signed   By: Garald Balding M.D.   On: 03/09/2017 03:00   US Abdomen Limited Ruq  Result Date: 03/09/2017 CLINICAL DATA:  Abdomen pain with elevated bilirubin EXAM: ULTRASOUND ABDOMEN LIMITED RIGHT UPPER QUADRANT COMPARISON:  None. FINDINGS: Gallbladder: Enlarged gallbladder. No shadowing stones or sludge. Negative sonographic Murphy. Upper normal wall thickness at 2.9 mm Common bile duct: Diameter: Enlarged measuring up to 11 mm Liver: Marked intra hepatic biliary dilatation. Coarse increased echogenicity. Portal vein is patent on color Doppler imaging with normal direction of blood flow towards the liver. IMPRESSION: 1. No shadowing stones. 2. Dilated gallbladder. Marked intra and extra hepatic biliary dilatation ; CT abdomen and pelvis could be obtained if concern  for obstructing lesion at the pancreas 3. Coarse increased liver echogenicity suggesting Please pick the correct US ABDOMEN LIMITED template. Electronically Signed   By: Donavan Foil M.D.   On: 03/09/2017 02:03    EKG: Orders placed or performed in visit on 06/12/07  . EKG 12-Lead    IMPRESSION AND PLAN: 41 year old male patient with history of mood disorder presented to the emergency room with abdominal pain, nausea, yellowish discoloration of the eyes.  Admitting diagnosis 1. Obstructive jaundice 2. Abdominal pain 3. Abnormal liver function tests 4. Nausea and vomiting Treatment plan Admit patient to medical floor inpatient service Consult gastroenterology MRCP abdomen Control pain with when necessary morphine Follow-up liver function tests  All the records are reviewed and case discussed with ED provider. Management plans discussed with the patient, family and they are in agreement.  CODE STATUS:FULL CODE Code Status History    This patient does not have a recorded code status. Please follow your organizational policy for patients in this situation.       TOTAL TIME TAKING CARE OF THIS PATIENT: 53 minutes.    Saundra Shelling M.D on 03/09/2017 at 4:02 AM  Between 7am to 6pm - Pager - [phone]  After 6pm go to [email or web address] - password [email or web address] [facility]  Office  [phone]  CC: Primary care physician; Patient, No Pcp Per

## 2017-03-09 NOTE — ED Notes (Signed)
Patient given meal tray per EDP order

## 2017-03-09 NOTE — ED Provider Notes (Signed)
[facility] Emergency Department Provider Note  ____________________________________________   First MD Initiated Contact with Patient 03/09/17 [phone]     (approximate)  I have reviewed the triage vital signs and the nursing notes.   HISTORY  Chief Complaint Abdominal Pain    HPI Travis Hawkins is a 41 y.o. male with no chronic medical issues who also does not drink alcohol regularly who is a former smoker and currently vapes who presents for multiple complaints that have been gradually worsening over the last month.  They are now severe.  He reports intermittent upper but also generalized abdominal pain, intermittent nausea but no vomiting, recently worsening bowel urgency, increased sensitivity to smells, and some generalized weakness.  All the symptoms have been gradually worsening for a month but have become much more severe over the last week.  Nothing in particular makes them better or worse and he does not feel that his abdominal pain gets worse after he eats.  He also reports generalized itching all over his skin that seems to be worse at night and he has noticed that his urine has been dark although it is variable from time to time.  He is not sure if it is blood or something else that is making the urine dark.  His symptoms were severe today and his wife also noticed that his eyes appeared more yellow than usual so he came for evaluation.  He currently denies any acute distress and not feel that he needs any pain or nausea medicine at this time.  He has not had anything to eat or drink for more than 12 hours.  Does not have a primary care doctor.   Past Medical History:  Diagnosis Date  . Mood disorder ([place])     There are no active problems to display for this patient.   History reviewed. No pertinent surgical history.  Prior to Admission medications   Medication Sig Start Date End Date Taking? Authorizing Provider  lamoTRIgine (LAMICTAL) 25 MG tablet  Take 25 mg by mouth at bedtime. Two tabs at bedtime.    [provider]  OLANZapine (ZYPREXA) 10 MG tablet Take 10 mg by mouth at bedtime.    [provider]    Allergies Patient has no allergy information on record.  Family History  Problem Relation Age of Onset  . Cancer Maternal Aunt   . Breast cancer Maternal Grandmother   . Lung cancer Maternal Grandfather     Social History Social History   Tobacco Use  . Smoking status: Former Smoker    Packs/day: 1.00    Years: 18.00    Pack years: 18.00    Types: Cigarettes    Last attempt to quit: 11/17/2014    Years since quitting: 2.3  . Smokeless tobacco: Never Used  Substance Use Topics  . Alcohol use: Yes    Comment: Occasional wine about every 3-4 months  . Drug use: No    Review of Systems Constitutional: No fever/chills Eyes: No visual changes. ENT: No sore throat. Cardiovascular: Denies chest pain. Respiratory: Denies shortness of breath. Gastrointestinal: Intermittent abdominal pain that is gradually worsening for about a month.  Nausea and increased sensitivity to smells, no vomiting.  Increased bowel urgency and frequency. Genitourinary: Negative for dysuria.  Dark colored urine. Musculoskeletal: Negative for neck pain.  Negative for back pain. Integumentary: Negative for visible rash but itching all over, worse at night Neurological: Negative for headaches, focal weakness or numbness.  Generalized weakness.  ____________________________________________   PHYSICAL EXAM:  VITAL SIGNS: ED Triage Vitals  Enc Vitals Group     BP 03/08/17 2230 (!) 110/54     Pulse Rate 03/08/17 2230 69     Resp 03/08/17 2230 16     Temp 03/08/17 2230 98.3 F (36.8 C)     Temp Source 03/08/17 2230 Oral     SpO2 03/08/17 2230 99 %     Weight 03/08/17 2231 92.5 kg (204 lb)     Height 03/08/17 2231 1.727 m (5\' 8" )     Head Circumference --      Peak Flow --      Pain Score 03/08/17 2230 8     Pain Loc --       Pain Edu? --      Excl. in [place]? --     Constitutional: Alert and oriented. Well appearing and in no acute distress. Eyes: Severe scleral icterus Head: Atraumatic. Nose: No congestion/rhinnorhea. Mouth/Throat: Mucous membranes are moist. Neck: No stridor.  No meningeal signs.   Cardiovascular: Normal rate, regular rhythm. Good peripheral circulation. Grossly normal heart sounds. Respiratory: Normal respiratory effort.  No retractions. Lungs CTAB. Gastrointestinal: Soft with mild generalized tenderness but negative Murphy sign and no tenderness at McBurney's point.  No rebound and no guarding.  No palpable organomegaly. Musculoskeletal: No lower extremity tenderness nor edema. No gross deformities of extremities. Neurologic:  Normal speech and language. No gross focal neurologic deficits are appreciated.  Skin:  Skin is warm, dry and intact. No rash noted. Psychiatric: Mood and affect are normal. Speech and behavior are normal.  ____________________________________________   LABS (all labs ordered are listed, but only abnormal results are displayed)  Labs Reviewed  LIPASE, BLOOD - Abnormal; Notable for the following components:      Result Value   Lipase 619 (*)    All other components within normal limits  COMPREHENSIVE METABOLIC PANEL - Abnormal; Notable for the following components:   CO2 21 (*)    Glucose, Bld 131 (*)    AST 144 (*)    ALT 347 (*)    Alkaline Phosphatase 258 (*)    Total Bilirubin 10.2 (*)    All other components within normal limits  CBC - Abnormal; Notable for the following components:   RBC 4.26 (*)    RDW 15.5 (*)    All other components within normal limits  URINALYSIS, COMPLETE (UACMP) WITH MICROSCOPIC - Abnormal; Notable for the following components:   Color, Urine AMBER (*)    APPearance CLEAR (*)    Hgb urine dipstick SMALL (*)    All other components within normal limits  BILIRUBIN, DIRECT - Abnormal; Notable for the following components:     Bilirubin, Direct 6.3 (*)    All other components within normal limits  PROTIME-INR  APTT  HEPATITIS PANEL, ACUTE   ____________________________________________  EKG  None - EKG not ordered by ED physician ____________________________________________  RADIOLOGY   Ct Abdomen Pelvis W Contrast  Result Date: 03/09/2017 CLINICAL DATA:  Chronic generalized abdominal pain and nausea. Lightheadedness and generalized weakness. Itchiness. EXAM: CT ABDOMEN AND PELVIS WITH CONTRAST TECHNIQUE: Multidetector CT imaging of the abdomen and pelvis was performed using the standard protocol following bolus administration of intravenous contrast. CONTRAST:  154mL ISOVUE-300 IOPAMIDOL (ISOVUE-300) INJECTION 61% COMPARISON:  Right upper quadrant ultrasound performed earlier today at 1:25 a.m. FINDINGS: Lower chest: The visualized lung bases are grossly clear. The visualized portions of the mediastinum are unremarkable. Hepatobiliary: There is  dilatation of the common bile duct to 1.5 cm in diameter, to the level of the pancreatic head. This may reflect an underlying obstructing stone, though mass cannot be excluded. There is diffuse prominence of the intrahepatic biliary ducts. The gallbladder is somewhat distended but otherwise grossly unremarkable. The liver is otherwise unremarkable in appearance. Pancreas: The pancreas is otherwise grossly unremarkable. Spleen: The spleen is unremarkable in appearance. Adrenals/Urinary Tract: The adrenal glands are unremarkable in appearance. The kidneys are within normal limits. There is no evidence of hydronephrosis. No renal or ureteral stones are identified. No perinephric stranding is seen. Stomach/Bowel: The stomach is unremarkable in appearance. The small bowel is within normal limits. The appendix is normal in caliber, without evidence of appendicitis. The colon is unremarkable in appearance. Vascular/Lymphatic: The abdominal aorta is unremarkable in appearance. The  inferior vena cava is grossly unremarkable. No retroperitoneal lymphadenopathy is seen. No pelvic sidewall lymphadenopathy is identified. Reproductive: The bladder is mildly distended and grossly unremarkable. The prostate is mildly enlarged, measuring 5.1 cm in transverse dimension. Other: No additional soft tissue abnormalities are seen. Musculoskeletal: No acute osseous abnormalities are identified. The visualized musculature is unremarkable in appearance. IMPRESSION: 1. Dilatation of the common bile duct to 1.5 cm in diameter, to the level of the pancreatic head. Diffuse prominence of the intrahepatic biliary ducts and mild gallbladder distention. This is concerning for underlying obstructing stone, though underlying mass cannot be excluded. ERCP or MRCP is recommended for further evaluation. 2. Mildly enlarged prostate. Electronically Signed   By: Garald Balding M.D.   On: 03/09/2017 03:00   US Abdomen Limited Ruq  Result Date: 03/09/2017 CLINICAL DATA:  Abdomen pain with elevated bilirubin EXAM: ULTRASOUND ABDOMEN LIMITED RIGHT UPPER QUADRANT COMPARISON:  None. FINDINGS: Gallbladder: Enlarged gallbladder. No shadowing stones or sludge. Negative sonographic Murphy. Upper normal wall thickness at 2.9 mm Common bile duct: Diameter: Enlarged measuring up to 11 mm Liver: Marked intra hepatic biliary dilatation. Coarse increased echogenicity. Portal vein is patent on color Doppler imaging with normal direction of blood flow towards the liver. IMPRESSION: 1. No shadowing stones. 2. Dilated gallbladder. Marked intra and extra hepatic biliary dilatation ; CT abdomen and pelvis could be obtained if concern for obstructing lesion at the pancreas 3. Coarse increased liver echogenicity suggesting Please pick the correct US ABDOMEN LIMITED template. Electronically Signed   By: Donavan Foil M.D.   On: 03/09/2017 02:03    ____________________________________________   PROCEDURES  Critical Care performed:  No   Procedure(s) performed:   Procedures   ____________________________________________   INITIAL IMPRESSION / ASSESSMENT AND PLAN / ED COURSE  As part of my medical decision making, I reviewed the following data within the [facility] notes reviewed and incorporated, Labs reviewed , Discussed with admitting physician (Dr. Estanislado Pandy) and A phone consult was requested and obtained from this/these consultant(s) Gastroenterology (Dr. Vicente Males).    Differential diagnosis includes neoplastic disease, gallbladder disease/biliary obstruction, pancreatitis, hepatitis, nonspecific infectious disease, etc.  Patient's labs are notable for elevated LFTs in the hundreds with profoundly elevated total bilirubin of 10.2.  Lipase is elevated at 619 but the patient has very minimal tenderness to palpation.  Coagulation studies are normal.  Urinalysis is unremarkable and metabolic panel is unremarkable except for LFTs.  Based on the patient's history and physical exam and lab findings I am most concerned about neoplastic disease.  We will proceed first with ultrasound but explained to the patient that we likely will need to obtain a  CT scan as well.  My discussion with him initially focused on the possibility of gallbladder disease and biliary obstruction and we were to proceed from this point and enter into additional differential diagnosis items as necessary.  The patient is hemodynamically stable at this time.  Clinical Course as of Mar 09 325  Sat Mar 09, 2017  0217 Bilirubin, Direct: (!) 6.3 [CF]  0218 Enlarged gallbladder and biliary dilitation, but without obvious pathology.  Radiologist recommended CT abd/pelvis which I think is appropriate.  Updated patient. US ABDOMEN LIMITED RUQ [CF]  0304 Questionable obstructive stone vs mass.  Likely needs ERCP vs MRCP.  Will discuss by phone with Dr. Vicente Males. CT Abdomen Pelvis W Contrast [CF]  0319 I discussed the case I phone in detail with Dr.  Vicente Males with gastroenterology.  He commended inpatient admission, MRCP tomorrow, and based on those results possibly an ERCP on Monday.  I updated the patient about the results and the plan.  I also discussed with him the small possibility that this could be a tumor but explained that it requires more workup and hopefully will just be a stone.  I answered any questions from him and his wife.  I then spoke by phone with Dr. Estanislado Pandy and explained the plan.  He will admit.  I ordered the GI consult - Dr. Estanislado Pandy will order the MRCP.  As per Dr. Vicente Males, it is okay for the patient to eat until it is determined if he requires an ERCP.  [CF]    Clinical Course User Index [CF] Hinda Kehr, MD    ____________________________________________  FINAL CLINICAL IMPRESSION(S) / ED DIAGNOSES  Final diagnoses:  Biliary obstruction  Hyperbilirubinemia  Elevated LFTs  Elevated lipase     MEDICATIONS GIVEN DURING THIS VISIT:  Medications  iopamidol (ISOVUE-300) 61 % injection 100 mL (100 mLs Intravenous Contrast Given 03/09/17 0239)     ED Discharge Orders    None       Note:  This document was prepared using Dragon voice recognition software and may include unintentional dictation errors.    Hinda Kehr, MD 03/09/17 [phone]

## 2017-03-09 NOTE — Progress Notes (Signed)
Patient ID: Travis Hawkins, male   DOB: 1975-06-27, 41 y.o.   MRN: [id number]  [facility] PROGRESS NOTE  Travis Hawkins LFY:[id number] DOB: [birth year] DOA: 03/09/2017 PCP: Patient, No Pcp Per  HPI/Subjective:   Objective: Vitals:   03/09/17 0451 03/09/17 0750  BP: 131/76 114/68  Pulse: 69   Resp: 17 16  Temp: 98.1 F (36.7 C) (!) 97.5 F (36.4 C)  SpO2: 99% 100%    Intake/Output Summary (Last 24 hours) at 03/09/2017 1324 Last data filed at 03/09/2017 1000 Gross per 24 hour  Intake -  Output 345 ml  Net -345 ml   Filed Weights   03/08/17 2231 03/09/17 0454  Weight: 92.5 kg (204 lb) 90.3 kg (199 lb 1.6 oz)    ROS: Review of Systems  Constitutional: Negative for chills and fever.  Eyes: Negative for blurred vision.  Respiratory: Negative for cough and shortness of breath.   Cardiovascular: Negative for chest pain.  Gastrointestinal: Positive for abdominal pain. Negative for constipation, diarrhea, nausea and vomiting.  Genitourinary: Negative for dysuria.  Musculoskeletal: Negative for joint pain.  Neurological: Negative for dizziness and headaches.   Exam: Physical Exam  Constitutional: He is oriented to person, place, and time.  HENT:  Nose: No mucosal edema.  Mouth/Throat: No oropharyngeal exudate or posterior oropharyngeal edema.  Eyes: EOM and lids are normal. Pupils are equal, round, and reactive to light.  Icteric  Neck: No JVD present. Carotid bruit is not present. No edema present. No thyroid mass and no thyromegaly present.  Cardiovascular: S1 normal and S2 normal. Exam reveals no gallop.  No murmur heard. Pulses:      Dorsalis pedis pulses are 2+ on the right side, and 2+ on the left side.  Respiratory: No respiratory distress. He has no wheezes. He has no rhonchi. He has no rales.  GI: Soft. Bowel sounds are normal. There is tenderness in the right upper quadrant.  Musculoskeletal:       Right ankle: He exhibits no swelling.       Left ankle: He  exhibits no swelling.  Lymphadenopathy:    He has no cervical adenopathy.  Neurological: He is alert and oriented to person, place, and time. No cranial nerve deficit.  Skin: Skin is warm. Nails show no clubbing.  Jaundiced  Psychiatric: He has a normal mood and affect.      Data Reviewed: Basic Metabolic Panel: Recent Labs  Lab 03/08/17 2233 03/09/17 0516  NA 135 136  K 3.8 3.7  CL 104 105  CO2 21* 22  GLUCOSE 131* 168*  BUN 9 8  CREATININE 0.65 0.72  CALCIUM 9.3 9.1   Liver Function Tests: Recent Labs  Lab 03/08/17 2233 03/09/17 0516  AST 144* 136*  ALT 347* 325*  ALKPHOS 258* 252*  BILITOT 10.2* 10.2*  PROT 8.1 7.7  ALBUMIN 4.0 3.9   Recent Labs  Lab 03/08/17 2233  LIPASE 619*   CBC: Recent Labs  Lab 03/08/17 2233 03/09/17 0516  WBC 6.5 5.6  HGB 13.9 13.8  HCT 41.5 40.5  MCV 97.4 96.9  PLT 220 212     Studies: Ct Abdomen Pelvis W Contrast  Result Date: 03/09/2017 CLINICAL DATA:  Chronic generalized abdominal pain and nausea. Lightheadedness and generalized weakness. Itchiness. EXAM: CT ABDOMEN AND PELVIS WITH CONTRAST TECHNIQUE: Multidetector CT imaging of the abdomen and pelvis was performed using the standard protocol following bolus administration of intravenous contrast. CONTRAST:  154mL ISOVUE-300 IOPAMIDOL (ISOVUE-300) INJECTION 61% COMPARISON:  Right  upper quadrant ultrasound performed earlier today at 1:25 a.m. FINDINGS: Lower chest: The visualized lung bases are grossly clear. The visualized portions of the mediastinum are unremarkable. Hepatobiliary: There is dilatation of the common bile duct to 1.5 cm in diameter, to the level of the pancreatic head. This may reflect an underlying obstructing stone, though mass cannot be excluded. There is diffuse prominence of the intrahepatic biliary ducts. The gallbladder is somewhat distended but otherwise grossly unremarkable. The liver is otherwise unremarkable in appearance. Pancreas: The pancreas is  otherwise grossly unremarkable. Spleen: The spleen is unremarkable in appearance. Adrenals/Urinary Tract: The adrenal glands are unremarkable in appearance. The kidneys are within normal limits. There is no evidence of hydronephrosis. No renal or ureteral stones are identified. No perinephric stranding is seen. Stomach/Bowel: The stomach is unremarkable in appearance. The small bowel is within normal limits. The appendix is normal in caliber, without evidence of appendicitis. The colon is unremarkable in appearance. Vascular/Lymphatic: The abdominal aorta is unremarkable in appearance. The inferior vena cava is grossly unremarkable. No retroperitoneal lymphadenopathy is seen. No pelvic sidewall lymphadenopathy is identified. Reproductive: The bladder is mildly distended and grossly unremarkable. The prostate is mildly enlarged, measuring 5.1 cm in transverse dimension. Other: No additional soft tissue abnormalities are seen. Musculoskeletal: No acute osseous abnormalities are identified. The visualized musculature is unremarkable in appearance. IMPRESSION: 1. Dilatation of the common bile duct to 1.5 cm in diameter, to the level of the pancreatic head. Diffuse prominence of the intrahepatic biliary ducts and mild gallbladder distention. This is concerning for underlying obstructing stone, though underlying mass cannot be excluded. ERCP or MRCP is recommended for further evaluation. 2. Mildly enlarged prostate. Electronically Signed   By: Garald Balding M.D.   On: 03/09/2017 03:00   Mr 3d Recon At Scanner  Result Date: 03/09/2017 CLINICAL DATA:  Biliary dilatation on recent CT scan. EXAM: MRI ABDOMEN WITHOUT AND WITH CONTRAST (INCLUDING MRCP) TECHNIQUE: Multiplanar multisequence MR imaging of the abdomen was performed both before and after the administration of intravenous contrast. Heavily T2-weighted images of the biliary and pancreatic ducts were obtained, and three-dimensional MRCP images were rendered by  post processing. CONTRAST:  18 cc MultiHance COMPARISON:  CT scan 03/09/2018 FINDINGS: Lower chest: The lung bases are grossly clear. No pleural or pericardial effusion. Hepatobiliary: As demonstrated on the CT scan there is moderate intra and extrahepatic biliary dilatation with the common bile duct measuring up to 13.5 mm in the head of the pancreas. No obstructing common bile duct stones are identified. There is some layering debris dependently in the common bile duct which demonstrates high T1 signal intensity and is likely cholesterol laden bile or sludge. No hepatic lesions. No ampullary lesion or pancreatic head mass. The cystic duct is also mildly dilated as is the gallbladder. No definite gallstones or findings for acute cholecystitis. Pancreas: No mass, inflammation or ductal dilatation. Normal caliber and course of the main pancreatic duct. Spleen:  Normal size.  No focal lesions. Adrenals/Urinary Tract:  The adrenal glands and kidneys are normal. Stomach/Bowel: Visualized portions within the abdomen are unremarkable. Vascular/Lymphatic: No pathologically enlarged lymph nodes identified. No abdominal aortic aneurysm demonstrated. Other:  No ascites or abdominal wall hernia. Musculoskeletal: No significant bony findings. IMPRESSION: 1. Intra and extrahepatic biliary dilatation without identifiable cause. Suspect common bile duct stricture. No definite common bile duct stones and no ampullary lesion or pancreatic head mass. Recommend ERCP for further evaluation and treatment. 2. Normal caliber and course of the main pancreatic duct.  3. No other significant abdominal findings. Electronically Signed   By: Marijo Sanes M.D.   On: 03/09/2017 10:42   Mr Abdomen Mrcp Moise Boring Contast  Result Date: 03/09/2017 CLINICAL DATA:  Biliary dilatation on recent CT scan. EXAM: MRI ABDOMEN WITHOUT AND WITH CONTRAST (INCLUDING MRCP) TECHNIQUE: Multiplanar multisequence MR imaging of the abdomen was performed both before  and after the administration of intravenous contrast. Heavily T2-weighted images of the biliary and pancreatic ducts were obtained, and three-dimensional MRCP images were rendered by post processing. CONTRAST:  18 cc MultiHance COMPARISON:  CT scan 03/09/2018 FINDINGS: Lower chest: The lung bases are grossly clear. No pleural or pericardial effusion. Hepatobiliary: As demonstrated on the CT scan there is moderate intra and extrahepatic biliary dilatation with the common bile duct measuring up to 13.5 mm in the head of the pancreas. No obstructing common bile duct stones are identified. There is some layering debris dependently in the common bile duct which demonstrates high T1 signal intensity and is likely cholesterol laden bile or sludge. No hepatic lesions. No ampullary lesion or pancreatic head mass. The cystic duct is also mildly dilated as is the gallbladder. No definite gallstones or findings for acute cholecystitis. Pancreas: No mass, inflammation or ductal dilatation. Normal caliber and course of the main pancreatic duct. Spleen:  Normal size.  No focal lesions. Adrenals/Urinary Tract:  The adrenal glands and kidneys are normal. Stomach/Bowel: Visualized portions within the abdomen are unremarkable. Vascular/Lymphatic: No pathologically enlarged lymph nodes identified. No abdominal aortic aneurysm demonstrated. Other:  No ascites or abdominal wall hernia. Musculoskeletal: No significant bony findings. IMPRESSION: 1. Intra and extrahepatic biliary dilatation without identifiable cause. Suspect common bile duct stricture. No definite common bile duct stones and no ampullary lesion or pancreatic head mass. Recommend ERCP for further evaluation and treatment. 2. Normal caliber and course of the main pancreatic duct. 3. No other significant abdominal findings. Electronically Signed   By: Marijo Sanes M.D.   On: 03/09/2017 10:42   US Abdomen Limited Ruq  Result Date: 03/09/2017 CLINICAL DATA:  Abdomen pain  with elevated bilirubin EXAM: ULTRASOUND ABDOMEN LIMITED RIGHT UPPER QUADRANT COMPARISON:  None. FINDINGS: Gallbladder: Enlarged gallbladder. No shadowing stones or sludge. Negative sonographic Murphy. Upper normal wall thickness at 2.9 mm Common bile duct: Diameter: Enlarged measuring up to 11 mm Liver: Marked intra hepatic biliary dilatation. Coarse increased echogenicity. Portal vein is patent on color Doppler imaging with normal direction of blood flow towards the liver. IMPRESSION: 1. No shadowing stones. 2. Dilated gallbladder. Marked intra and extra hepatic biliary dilatation ; CT abdomen and pelvis could be obtained if concern for obstructing lesion at the pancreas 3. Coarse increased liver echogenicity suggesting Please pick the correct US ABDOMEN LIMITED template. Electronically Signed   By: Donavan Foil M.D.   On: 03/09/2017 02:03    Scheduled Meds: . docusate sodium  100 mg Oral BID  . enoxaparin (LOVENOX) injection  40 mg Subcutaneous Q24H  . lamoTRIgine  50 mg Oral QHS  . OLANZapine  10 mg Oral QHS  . sodium chloride flush  3 mL Intravenous Q12H   Continuous Infusions: . sodium chloride    . sodium chloride 100 mL/hr at 03/09/17 4854    Assessment/Plan:  1. Jaundice, elevated liver function test.  MRCP shows dilated ducts, no stones.  Also commented on sludge in the biliary tree.  Rule out stricture.  Patient was seen in consultation by gastroenterology and they recommended an ERCP on Monday. 2. Pancreatitis.  IV  fluid hydration.  Clear liquid diet since having no pain.  3. Mood disorder on lamotrigine and olanzapine 4. Impaired fasting glucose check a hemoglobin A1c tomorrow morning 5. Itching.  As needed Atarax  Code Status:     Code Status Orders  (From admission, onward)        Start     Ordered   03/09/17 0449  Full code  Continuous     03/09/17 0448    Code Status History    Date Active Date Inactive Code Status Order ID Comments User Context   This patient  has a current code status but no historical code status.     Family Communication: Permission to call Carlus Pavlov on the phone [phone] and given update.  I did this. Disposition Plan: If ERCP is on Monday potential disposition on Tuesday.  Consultants:  Gastroenterology  Time spent: 35 minutes  [place], [place]

## 2017-03-10 LAB — COMPREHENSIVE METABOLIC PANEL
ALBUMIN: 3.8 g/dL (ref 3.5–5.0)
ALT: 292 U/L — AB (ref 17–63)
AST: 115 U/L — AB (ref 15–41)
Alkaline Phosphatase: 250 U/L — ABNORMAL HIGH (ref 38–126)
Anion gap: 8 (ref 5–15)
BUN: 9 mg/dL (ref 6–20)
CALCIUM: 9.4 mg/dL (ref 8.9–10.3)
CHLORIDE: 106 mmol/L (ref 101–111)
CO2: 22 mmol/L (ref 22–32)
Creatinine, Ser: 0.6 mg/dL — ABNORMAL LOW (ref 0.61–1.24)
GFR calc Af Amer: >60 mL/min (ref >60)
GFR calc non Af Amer: >60 mL/min (ref >60)
Glucose, Bld: 109 mg/dL — ABNORMAL HIGH (ref 65–99)
Potassium: 4.4 mmol/L (ref 3.5–5.1)
SODIUM: 136 mmol/L (ref 135–145)
TOTAL PROTEIN: 7.8 g/dL (ref 6.5–8.1)
Total Bilirubin: 10.5 mg/dL — ABNORMAL HIGH (ref 0.3–1.2)

## 2017-03-10 LAB — HEPATITIS PANEL, ACUTE
HCV Ab: 0.1 s/co ratio (ref 0.0–0.9)
HEP B C IGM: NEGATIVE
HEP B S AG: NEGATIVE
Hep A IgM: NEGATIVE

## 2017-03-10 LAB — HIV ANTIBODY (ROUTINE TESTING W REFLEX): HIV SCREEN 4TH GENERATION: NONREACTIVE

## 2017-03-10 LAB — LIPASE, BLOOD: Lipase: 395 U/L — ABNORMAL HIGH (ref 11–51)

## 2017-03-10 MED ORDER — SODIUM CHLORIDE 0.9 % IV SOLN
1.5000 g | Freq: Once | INTRAVENOUS | Status: AC
  Administered 2017-03-11: 1.5 g via INTRAVENOUS
  Filled 2017-03-10: qty 1.5

## 2017-03-10 MED ORDER — ENOXAPARIN SODIUM 40 MG/0.4ML ~~LOC~~ SOLN
40.0000 mg | SUBCUTANEOUS | Status: DC
  Administered 2017-03-12: 40 mg via SUBCUTANEOUS
  Filled 2017-03-10: qty 0.4

## 2017-03-10 NOTE — Progress Notes (Signed)
Patient ID: Travis Hawkins, male   DOB: January 11, 1976, 41 y.o.   MRN: [id number]   [facility] PROGRESS NOTE  Travis Hawkins Travis Hawkins DOB: 04-16-1976 DOA: 03/09/2017 PCP: Patient, No Pcp Per  HPI/Subjective: Patient feeling okay.  Tolerating diet.  No abdominal pain.  Still having itching.  Atarax helped.  Objective: Vitals:   03/09/17 2003 03/10/17 0356  BP: 113/70 100/60  Pulse: (!) 58 (!) 56  Resp: 18 18  Temp: 97.6 F (36.4 C) (!) 97.5 F (36.4 C)  SpO2: 100% 99%    Intake/Output Summary (Last 24 hours) at 03/10/2017 0835 Last data filed at 03/10/2017 0557 Gross per 24 hour  Intake 2152 ml  Output 720 ml  Net 1432 ml   Filed Weights   03/08/17 2231 03/09/17 0454  Weight: 92.5 kg (204 lb) 90.3 kg (199 lb 1.6 oz)    ROS: Review of Systems  Constitutional: Negative for chills and fever.  Eyes: Negative for blurred vision.  Respiratory: Negative for cough and shortness of breath.   Cardiovascular: Negative for chest pain.  Gastrointestinal: Negative for abdominal pain, constipation, diarrhea, nausea and vomiting.  Genitourinary: Negative for dysuria.  Musculoskeletal: Negative for joint pain.  Neurological: Negative for dizziness and headaches.   Exam: Physical Exam  Constitutional: He is oriented to person, place, and time.  HENT:  Nose: No mucosal edema.  Mouth/Throat: No oropharyngeal exudate or posterior oropharyngeal edema.  Eyes: EOM and lids are normal. Pupils are equal, round, and reactive to light.  Icteric  Neck: No JVD present. Carotid bruit is not present. No edema present. No thyroid mass and no thyromegaly present.  Cardiovascular: S1 normal and S2 normal. Exam reveals no gallop.  No murmur heard. Pulses:      Dorsalis pedis pulses are 2+ on the right side, and 2+ on the left side.  Respiratory: No respiratory distress. He has no wheezes. He has no rhonchi. He has no rales.  GI: Soft. Bowel sounds are normal. There is no tenderness.   Musculoskeletal:       Right ankle: He exhibits no swelling.       Left ankle: He exhibits no swelling.  Lymphadenopathy:    He has no cervical adenopathy.  Neurological: He is alert and oriented to person, place, and time. No cranial nerve deficit.  Skin: Skin is warm. Nails show no clubbing.  Jaundiced  Psychiatric: He has a normal mood and affect.      Data Reviewed: Basic Metabolic Panel: Recent Labs  Lab 03/08/17 2233 03/09/17 0516  NA 135 136  K 3.8 3.7  CL 104 105  CO2 21* 22  GLUCOSE 131* 168*  BUN 9 8  CREATININE 0.65 0.72  CALCIUM 9.3 9.1   Liver Function Tests: Recent Labs  Lab 03/08/17 2233 03/09/17 0516  AST 144* 136*  ALT 347* 325*  ALKPHOS 258* 252*  BILITOT 10.2* 10.2*  PROT 8.1 7.7  ALBUMIN 4.0 3.9   Recent Labs  Lab 03/08/17 2233  LIPASE 619*   CBC: Recent Labs  Lab 03/08/17 2233 03/09/17 0516  WBC 6.5 5.6  HGB 13.9 13.8  HCT 41.5 40.5  MCV 97.4 96.9  PLT 220 212     Studies: Ct Abdomen Pelvis W Contrast  Result Date: 03/09/2017 CLINICAL DATA:  Chronic generalized abdominal pain and nausea. Lightheadedness and generalized weakness. Itchiness. EXAM: CT ABDOMEN AND PELVIS WITH CONTRAST TECHNIQUE: Multidetector CT imaging of the abdomen and pelvis was performed using the standard protocol following bolus administration  of intravenous contrast. CONTRAST:  157mL ISOVUE-300 IOPAMIDOL (ISOVUE-300) INJECTION 61% COMPARISON:  Right upper quadrant ultrasound performed earlier today at 1:25 a.m. FINDINGS: Lower chest: The visualized lung bases are grossly clear. The visualized portions of the mediastinum are unremarkable. Hepatobiliary: There is dilatation of the common bile duct to 1.5 cm in diameter, to the level of the pancreatic head. This may reflect an underlying obstructing stone, though mass cannot be excluded. There is diffuse prominence of the intrahepatic biliary ducts. The gallbladder is somewhat distended but otherwise grossly  unremarkable. The liver is otherwise unremarkable in appearance. Pancreas: The pancreas is otherwise grossly unremarkable. Spleen: The spleen is unremarkable in appearance. Adrenals/Urinary Tract: The adrenal glands are unremarkable in appearance. The kidneys are within normal limits. There is no evidence of hydronephrosis. No renal or ureteral stones are identified. No perinephric stranding is seen. Stomach/Bowel: The stomach is unremarkable in appearance. The small bowel is within normal limits. The appendix is normal in caliber, without evidence of appendicitis. The colon is unremarkable in appearance. Vascular/Lymphatic: The abdominal aorta is unremarkable in appearance. The inferior vena cava is grossly unremarkable. No retroperitoneal lymphadenopathy is seen. No pelvic sidewall lymphadenopathy is identified. Reproductive: The bladder is mildly distended and grossly unremarkable. The prostate is mildly enlarged, measuring 5.1 cm in transverse dimension. Other: No additional soft tissue abnormalities are seen. Musculoskeletal: No acute osseous abnormalities are identified. The visualized musculature is unremarkable in appearance. IMPRESSION: 1. Dilatation of the common bile duct to 1.5 cm in diameter, to the level of the pancreatic head. Diffuse prominence of the intrahepatic biliary ducts and mild gallbladder distention. This is concerning for underlying obstructing stone, though underlying mass cannot be excluded. ERCP or MRCP is recommended for further evaluation. 2. Mildly enlarged prostate. Electronically Signed   By: Garald Balding M.D.   On: 03/09/2017 03:00   Mr 3d Recon At Scanner  Result Date: 03/09/2017 CLINICAL DATA:  Biliary dilatation on recent CT scan. EXAM: MRI ABDOMEN WITHOUT AND WITH CONTRAST (INCLUDING MRCP) TECHNIQUE: Multiplanar multisequence MR imaging of the abdomen was performed both before and after the administration of intravenous contrast. Heavily T2-weighted images of the  biliary and pancreatic ducts were obtained, and three-dimensional MRCP images were rendered by post processing. CONTRAST:  18 cc MultiHance COMPARISON:  CT scan 03/09/2018 FINDINGS: Lower chest: The lung bases are grossly clear. No pleural or pericardial effusion. Hepatobiliary: As demonstrated on the CT scan there is moderate intra and extrahepatic biliary dilatation with the common bile duct measuring up to 13.5 mm in the head of the pancreas. No obstructing common bile duct stones are identified. There is some layering debris dependently in the common bile duct which demonstrates high T1 signal intensity and is likely cholesterol laden bile or sludge. No hepatic lesions. No ampullary lesion or pancreatic head mass. The cystic duct is also mildly dilated as is the gallbladder. No definite gallstones or findings for acute cholecystitis. Pancreas: No mass, inflammation or ductal dilatation. Normal caliber and course of the main pancreatic duct. Spleen:  Normal size.  No focal lesions. Adrenals/Urinary Tract:  The adrenal glands and kidneys are normal. Stomach/Bowel: Visualized portions within the abdomen are unremarkable. Vascular/Lymphatic: No pathologically enlarged lymph nodes identified. No abdominal aortic aneurysm demonstrated. Other:  No ascites or abdominal wall hernia. Musculoskeletal: No significant bony findings. IMPRESSION: 1. Intra and extrahepatic biliary dilatation without identifiable cause. Suspect common bile duct stricture. No definite common bile duct stones and no ampullary lesion or pancreatic head mass. Recommend ERCP for  further evaluation and treatment. 2. Normal caliber and course of the main pancreatic duct. 3. No other significant abdominal findings. Electronically Signed   By: Marijo Sanes M.D.   On: 03/09/2017 10:42   Mr Abdomen Mrcp Moise Boring Contast  Result Date: 03/09/2017 CLINICAL DATA:  Biliary dilatation on recent CT scan. EXAM: MRI ABDOMEN WITHOUT AND WITH CONTRAST (INCLUDING  MRCP) TECHNIQUE: Multiplanar multisequence MR imaging of the abdomen was performed both before and after the administration of intravenous contrast. Heavily T2-weighted images of the biliary and pancreatic ducts were obtained, and three-dimensional MRCP images were rendered by post processing. CONTRAST:  18 cc MultiHance COMPARISON:  CT scan 03/09/2018 FINDINGS: Lower chest: The lung bases are grossly clear. No pleural or pericardial effusion. Hepatobiliary: As demonstrated on the CT scan there is moderate intra and extrahepatic biliary dilatation with the common bile duct measuring up to 13.5 mm in the head of the pancreas. No obstructing common bile duct stones are identified. There is some layering debris dependently in the common bile duct which demonstrates high T1 signal intensity and is likely cholesterol laden bile or sludge. No hepatic lesions. No ampullary lesion or pancreatic head mass. The cystic duct is also mildly dilated as is the gallbladder. No definite gallstones or findings for acute cholecystitis. Pancreas: No mass, inflammation or ductal dilatation. Normal caliber and course of the main pancreatic duct. Spleen:  Normal size.  No focal lesions. Adrenals/Urinary Tract:  The adrenal glands and kidneys are normal. Stomach/Bowel: Visualized portions within the abdomen are unremarkable. Vascular/Lymphatic: No pathologically enlarged lymph nodes identified. No abdominal aortic aneurysm demonstrated. Other:  No ascites or abdominal wall hernia. Musculoskeletal: No significant bony findings. IMPRESSION: 1. Intra and extrahepatic biliary dilatation without identifiable cause. Suspect common bile duct stricture. No definite common bile duct stones and no ampullary lesion or pancreatic head mass. Recommend ERCP for further evaluation and treatment. 2. Normal caliber and course of the main pancreatic duct. 3. No other significant abdominal findings. Electronically Signed   By: Marijo Sanes M.D.   On:  03/09/2017 10:42   US Abdomen Limited Ruq  Result Date: 03/09/2017 CLINICAL DATA:  Abdomen pain with elevated bilirubin EXAM: ULTRASOUND ABDOMEN LIMITED RIGHT UPPER QUADRANT COMPARISON:  None. FINDINGS: Gallbladder: Enlarged gallbladder. No shadowing stones or sludge. Negative sonographic Murphy. Upper normal wall thickness at 2.9 mm Common bile duct: Diameter: Enlarged measuring up to 11 mm Liver: Marked intra hepatic biliary dilatation. Coarse increased echogenicity. Portal vein is patent on color Doppler imaging with normal direction of blood flow towards the liver. IMPRESSION: 1. No shadowing stones. 2. Dilated gallbladder. Marked intra and extra hepatic biliary dilatation ; CT abdomen and pelvis could be obtained if concern for obstructing lesion at the pancreas 3. Coarse increased liver echogenicity suggesting Please pick the correct US ABDOMEN LIMITED template. Electronically Signed   By: Donavan Foil M.D.   On: 03/09/2017 02:03    Scheduled Meds: . docusate sodium  100 mg Oral BID  . enoxaparin (LOVENOX) injection  40 mg Subcutaneous Q24H  . lamoTRIgine  50 mg Oral QHS  . OLANZapine  10 mg Oral QHS  . sodium chloride flush  3 mL Intravenous Q12H   Continuous Infusions: . sodium chloride    . sodium chloride 100 mL/hr at 03/10/17 0556    Assessment/Plan:  1. Jaundice, elevated liver function test.  MRCP shows dilated ducts, no stones.  Also commented on sludge in the biliary tree.  Rule out stricture.  ERCP scheduled for Monday.  Labs pending from this morning 2. Pancreatitis.  IV fluid hydration.  GI placed on diet.  Repeat lipase pending from this morning 3. Mood disorder on lamotrigine and olanzapine 4. Impaired fasting glucose check a hemoglobin A1c tomorrow morning 5. Itching.  As needed Atarax  Code Status:     Code Status Orders  (From admission, onward)        Start     Ordered   03/09/17 0449  Full code  Continuous     03/09/17 0448    Code Status History     Date Active Date Inactive Code Status Order ID Comments User Context   This patient has a current code status but no historical code status.     Family Communication: Spoke with Carlus Pavlov on the phone [phone] yesterday for plan Disposition Plan: If ERCP is on Monday potential disposition on Tuesday.  Consultants:  Gastroenterology  Time spent: 24 minutes  [place], [place]

## 2017-03-10 NOTE — Progress Notes (Signed)
Jonathon Bellows , MD [address] [place], [place], [place], [address] [address], [place], [place], [place], [address] Phone: [phone]  Fax: [place] is being followed for biliary obstruction  Day 1 of follow up   Subjective: No complaints , no fever no abdominal pain    Objective: Vital signs in last 24 hours: Vitals:   03/09/17 0750 03/09/17 1403 03/09/17 2003 03/10/17 0356  BP: 114/68 113/64 113/70 100/60  Pulse:  71 (!) 58 (!) 56  Resp: 16 20 18 18   Temp: (!) 97.5 F (36.4 C) 98 F (36.7 C) 97.6 F (36.4 C) (!) 97.5 F (36.4 C)  TempSrc: Oral Oral Oral Oral  SpO2: 100% 100% 100% 99%  Weight:      Height:       Weight change:   Intake/Output Summary (Last 24 hours) at 03/10/2017 0851 Last data filed at 03/10/2017 0557 Gross per 24 hour  Intake 2152 ml  Output 720 ml  Net 1432 ml     Exam: Heart:: Regular rate and rhythm, S1S2 present or without murmur or extra heart sounds Lungs: normal, clear to auscultation and clear to auscultation and percussion Abdomen: soft, nontender, normal bowel sounds   CMP Latest Ref Rng & Units 03/10/2017 03/09/2017 03/08/2017  Glucose 65 - 99 mg/dL 109(H) 168(H) 131(H)  BUN 6 - 20 mg/dL 9 8 9   Creatinine 0.61 - 1.24 mg/dL 0.60(L) 0.72 0.65  Sodium 135 - 145 mmol/L 136 136 135  Potassium 3.5 - 5.1 mmol/L 4.4 3.7 3.8  Chloride 101 - 111 mmol/L 106 105 104  CO2 22 - 32 mmol/L 22 22 21(L)  Calcium 8.9 - 10.3 mg/dL 9.4 9.1 9.3  Total Protein 6.5 - 8.1 g/dL 7.8 7.7 8.1  Total Bilirubin 0.3 - 1.2 mg/dL 10.5(H) 10.2(H) 10.2(H)  Alkaline Phos 38 - 126 U/L 250(H) 252(H) 258(H)  AST 15 - 41 U/L 115(H) 136(H) 144(H)  ALT 17 - 63 U/L 292(H) 325(H) 347(H)    Micro Results: No results found for this or any previous visit (from the past 240 hour(s)). Studies/Results: Ct Abdomen Pelvis W Contrast  Result Date: 03/09/2017 CLINICAL DATA:  Chronic generalized abdominal pain and nausea. Lightheadedness and  generalized weakness. Itchiness. EXAM: CT ABDOMEN AND PELVIS WITH CONTRAST TECHNIQUE: Multidetector CT imaging of the abdomen and pelvis was performed using the standard protocol following bolus administration of intravenous contrast. CONTRAST:  158mL ISOVUE-300 IOPAMIDOL (ISOVUE-300) INJECTION 61% COMPARISON:  Right upper quadrant ultrasound performed earlier today at 1:25 a.m. FINDINGS: Lower chest: The visualized lung bases are grossly clear. The visualized portions of the mediastinum are unremarkable. Hepatobiliary: There is dilatation of the common bile duct to 1.5 cm in diameter, to the level of the pancreatic head. This may reflect an underlying obstructing stone, though mass cannot be excluded. There is diffuse prominence of the intrahepatic biliary ducts. The gallbladder is somewhat distended but otherwise grossly unremarkable. The liver is otherwise unremarkable in appearance. Pancreas: The pancreas is otherwise grossly unremarkable. Spleen: The spleen is unremarkable in appearance. Adrenals/Urinary Tract: The adrenal glands are unremarkable in appearance. The kidneys are within normal limits. There is no evidence of hydronephrosis. No renal or ureteral stones are identified. No perinephric stranding is seen. Stomach/Bowel: The stomach is unremarkable in appearance. The small bowel is within normal limits. The appendix is normal in caliber, without evidence of appendicitis. The colon is unremarkable in appearance. Vascular/Lymphatic: The abdominal aorta is unremarkable in appearance. The inferior vena cava  is grossly unremarkable. No retroperitoneal lymphadenopathy is seen. No pelvic sidewall lymphadenopathy is identified. Reproductive: The bladder is mildly distended and grossly unremarkable. The prostate is mildly enlarged, measuring 5.1 cm in transverse dimension. Other: No additional soft tissue abnormalities are seen. Musculoskeletal: No acute osseous abnormalities are identified. The visualized  musculature is unremarkable in appearance. IMPRESSION: 1. Dilatation of the common bile duct to 1.5 cm in diameter, to the level of the pancreatic head. Diffuse prominence of the intrahepatic biliary ducts and mild gallbladder distention. This is concerning for underlying obstructing stone, though underlying mass cannot be excluded. ERCP or MRCP is recommended for further evaluation. 2. Mildly enlarged prostate. Electronically Signed   By: Garald Balding M.D.   On: 03/09/2017 03:00   Mr 3d Recon At Scanner  Result Date: 03/09/2017 CLINICAL DATA:  Biliary dilatation on recent CT scan. EXAM: MRI ABDOMEN WITHOUT AND WITH CONTRAST (INCLUDING MRCP) TECHNIQUE: Multiplanar multisequence MR imaging of the abdomen was performed both before and after the administration of intravenous contrast. Heavily T2-weighted images of the biliary and pancreatic ducts were obtained, and three-dimensional MRCP images were rendered by post processing. CONTRAST:  18 cc MultiHance COMPARISON:  CT scan 03/09/2018 FINDINGS: Lower chest: The lung bases are grossly clear. No pleural or pericardial effusion. Hepatobiliary: As demonstrated on the CT scan there is moderate intra and extrahepatic biliary dilatation with the common bile duct measuring up to 13.5 mm in the head of the pancreas. No obstructing common bile duct stones are identified. There is some layering debris dependently in the common bile duct which demonstrates high T1 signal intensity and is likely cholesterol laden bile or sludge. No hepatic lesions. No ampullary lesion or pancreatic head mass. The cystic duct is also mildly dilated as is the gallbladder. No definite gallstones or findings for acute cholecystitis. Pancreas: No mass, inflammation or ductal dilatation. Normal caliber and course of the main pancreatic duct. Spleen:  Normal size.  No focal lesions. Adrenals/Urinary Tract:  The adrenal glands and kidneys are normal. Stomach/Bowel: Visualized portions within the  abdomen are unremarkable. Vascular/Lymphatic: No pathologically enlarged lymph nodes identified. No abdominal aortic aneurysm demonstrated. Other:  No ascites or abdominal wall hernia. Musculoskeletal: No significant bony findings. IMPRESSION: 1. Intra and extrahepatic biliary dilatation without identifiable cause. Suspect common bile duct stricture. No definite common bile duct stones and no ampullary lesion or pancreatic head mass. Recommend ERCP for further evaluation and treatment. 2. Normal caliber and course of the main pancreatic duct. 3. No other significant abdominal findings. Electronically Signed   By: Marijo Sanes M.D.   On: 03/09/2017 10:42   Mr Abdomen Mrcp Moise Boring Contast  Result Date: 03/09/2017 CLINICAL DATA:  Biliary dilatation on recent CT scan. EXAM: MRI ABDOMEN WITHOUT AND WITH CONTRAST (INCLUDING MRCP) TECHNIQUE: Multiplanar multisequence MR imaging of the abdomen was performed both before and after the administration of intravenous contrast. Heavily T2-weighted images of the biliary and pancreatic ducts were obtained, and three-dimensional MRCP images were rendered by post processing. CONTRAST:  18 cc MultiHance COMPARISON:  CT scan 03/09/2018 FINDINGS: Lower chest: The lung bases are grossly clear. No pleural or pericardial effusion. Hepatobiliary: As demonstrated on the CT scan there is moderate intra and extrahepatic biliary dilatation with the common bile duct measuring up to 13.5 mm in the head of the pancreas. No obstructing common bile duct stones are identified. There is some layering debris dependently in the common bile duct which demonstrates high T1 signal intensity and is likely cholesterol laden  bile or sludge. No hepatic lesions. No ampullary lesion or pancreatic head mass. The cystic duct is also mildly dilated as is the gallbladder. No definite gallstones or findings for acute cholecystitis. Pancreas: No mass, inflammation or ductal dilatation. Normal caliber and course of  the main pancreatic duct. Spleen:  Normal size.  No focal lesions. Adrenals/Urinary Tract:  The adrenal glands and kidneys are normal. Stomach/Bowel: Visualized portions within the abdomen are unremarkable. Vascular/Lymphatic: No pathologically enlarged lymph nodes identified. No abdominal aortic aneurysm demonstrated. Other:  No ascites or abdominal wall hernia. Musculoskeletal: No significant bony findings. IMPRESSION: 1. Intra and extrahepatic biliary dilatation without identifiable cause. Suspect common bile duct stricture. No definite common bile duct stones and no ampullary lesion or pancreatic head mass. Recommend ERCP for further evaluation and treatment. 2. Normal caliber and course of the main pancreatic duct. 3. No other significant abdominal findings. Electronically Signed   By: Marijo Sanes M.D.   On: 03/09/2017 10:42   US Abdomen Limited Ruq  Result Date: 03/09/2017 CLINICAL DATA:  Abdomen pain with elevated bilirubin EXAM: ULTRASOUND ABDOMEN LIMITED RIGHT UPPER QUADRANT COMPARISON:  None. FINDINGS: Gallbladder: Enlarged gallbladder. No shadowing stones or sludge. Negative sonographic Murphy. Upper normal wall thickness at 2.9 mm Common bile duct: Diameter: Enlarged measuring up to 11 mm Liver: Marked intra hepatic biliary dilatation. Coarse increased echogenicity. Portal vein is patent on color Doppler imaging with normal direction of blood flow towards the liver. IMPRESSION: 1. No shadowing stones. 2. Dilated gallbladder. Marked intra and extra hepatic biliary dilatation ; CT abdomen and pelvis could be obtained if concern for obstructing lesion at the pancreas 3. Coarse increased liver echogenicity suggesting Please pick the correct US ABDOMEN LIMITED template. Electronically Signed   By: Donavan Foil M.D.   On: 03/09/2017 02:03   Medications: I have reviewed the patient's current medications. Scheduled Meds: . docusate sodium  100 mg Oral BID  . enoxaparin (LOVENOX) injection  40 mg  Subcutaneous Q24H  . lamoTRIgine  50 mg Oral QHS  . OLANZapine  10 mg Oral QHS  . sodium chloride flush  3 mL Intravenous Q12H   Continuous Infusions: . sodium chloride    . sodium chloride 100 mL/hr at 03/10/17 0556   PRN Meds:.sodium chloride, hydrOXYzine, morphine injection, ondansetron **OR** ondansetron (ZOFRAN) IV, oxyCODONE, sodium chloride flush   Assessment: Active Problems:   Biliary obstruction SHERLEY LESER is a 41 y.o. y/o male  has been admitted with obstructive jaundice, pancreatitis (elevated Lipase improving ).  Imaging including a CAT scan and MRCP showed no clear identifiable blockage.  But it is obvious that his there is some form of blockage at the common bile duct is dilated.  The differentials at this point would include papillary stenosis, ampullary adenoma, a stone in the distal aspect of the common bile duct probably less likely though.   Plan: 1.  ERCP tomorrow  2.  n.p.o. from  midnight    LOS: 1 day   Jonathon Bellows, MD 03/10/2017, 8:51 AM

## 2017-03-11 ENCOUNTER — Encounter
Admission: EM | Disposition: A | Discharge status: Home / Self Care | Payer: Self-pay | Source: Home / Self Care | Attending: Internal Medicine

## 2017-03-11 ENCOUNTER — Encounter: Payer: Self-pay | Admitting: *Deleted

## 2017-03-11 ENCOUNTER — Inpatient Hospital Stay: Payer: Self-pay | Admitting: Anesthesiology

## 2017-03-11 ENCOUNTER — Inpatient Hospital Stay: Payer: Self-pay

## 2017-03-11 DIAGNOSIS — K838 Other specified diseases of biliary tract: Secondary | ICD-10-CM | POA: Diagnosis not present

## 2017-03-11 DIAGNOSIS — K8050 Calculus of bile duct without cholangitis or cholecystitis without obstruction: Secondary | ICD-10-CM

## 2017-03-11 DIAGNOSIS — R748 Abnormal levels of other serum enzymes: Secondary | ICD-10-CM

## 2017-03-11 HISTORY — PX: ENDOSCOPIC RETROGRADE CHOLANGIOPANCREATOGRAPHY (ERCP) WITH PROPOFOL: SHX5810

## 2017-03-11 LAB — COMPREHENSIVE METABOLIC PANEL
ALBUMIN: 3.5 g/dL (ref 3.5–5.0)
ALK PHOS: 216 U/L — AB (ref 38–126)
ALT: 239 U/L — ABNORMAL HIGH (ref 17–63)
ANION GAP: 7 (ref 5–15)
AST: 97 U/L — ABNORMAL HIGH (ref 15–41)
BILIRUBIN TOTAL: 8.8 mg/dL — AB (ref 0.3–1.2)
BUN: 8 mg/dL (ref 6–20)
CALCIUM: 8.8 mg/dL — AB (ref 8.9–10.3)
CO2: 25 mmol/L (ref 22–32)
Chloride: 105 mmol/L (ref 101–111)
Creatinine, Ser: 0.7 mg/dL (ref 0.61–1.24)
GFR calc non Af Amer: >60 mL/min (ref >60)
GLUCOSE: 140 mg/dL — AB (ref 65–99)
POTASSIUM: 3.9 mmol/L (ref 3.5–5.1)
SODIUM: 137 mmol/L (ref 135–145)
TOTAL PROTEIN: 7.2 g/dL (ref 6.5–8.1)

## 2017-03-11 LAB — HEMOGLOBIN A1C
HEMOGLOBIN A1C: 5.7 % — AB (ref 4.8–5.6)
MEAN PLASMA GLUCOSE: 116.89 mg/dL

## 2017-03-11 LAB — LIPASE, BLOOD: Lipase: 571 U/L — ABNORMAL HIGH (ref 11–51)

## 2017-03-11 SURGERY — ENDOSCOPIC RETROGRADE CHOLANGIOPANCREATOGRAPHY (ERCP) WITH PROPOFOL
Anesthesia: General

## 2017-03-11 MED ORDER — MIDAZOLAM HCL 2 MG/2ML IJ SOLN
INTRAMUSCULAR | Status: DC | PRN
  Administered 2017-03-11: 1 mg via INTRAVENOUS

## 2017-03-11 MED ORDER — SODIUM CHLORIDE 0.9 % IV SOLN
1.5000 g | Freq: Once | INTRAVENOUS | Status: DC
  Filled 2017-03-11: qty 1.5

## 2017-03-11 MED ORDER — SODIUM CHLORIDE 0.9 % IV SOLN: INTRAVENOUS | Status: DC

## 2017-03-11 MED ORDER — GLYCOPYRROLATE 0.2 MG/ML IJ SOLN
INTRAMUSCULAR | Status: DC | PRN
  Administered 2017-03-11: 0.2 mg via INTRAVENOUS

## 2017-03-11 MED ORDER — LIDOCAINE HCL (CARDIAC) 20 MG/ML IV SOLN
INTRAVENOUS | Status: DC | PRN
  Administered 2017-03-11: 60 mg via INTRATRACHEAL

## 2017-03-11 MED ORDER — MIDAZOLAM HCL 2 MG/2ML IJ SOLN
INTRAMUSCULAR | Status: AC
  Filled 2017-03-11: qty 2

## 2017-03-11 MED ORDER — ACETAMINOPHEN 325 MG PO TABS
650.0000 mg | ORAL_TABLET | Freq: Four times a day (QID) | ORAL | Status: DC | PRN
Start: 2017-03-11 — End: 2017-03-12

## 2017-03-11 MED ORDER — PROPOFOL 500 MG/50ML IV EMUL
INTRAVENOUS | Status: AC
  Filled 2017-03-11: qty 50

## 2017-03-11 MED ORDER — PROPOFOL 500 MG/50ML IV EMUL
INTRAVENOUS | Status: DC | PRN
  Administered 2017-03-11: 180 ug/kg/min via INTRAVENOUS

## 2017-03-11 MED ORDER — FENTANYL CITRATE (PF) 100 MCG/2ML IJ SOLN
INTRAMUSCULAR | Status: DC | PRN
  Administered 2017-03-11: 50 ug via INTRAVENOUS

## 2017-03-11 MED ORDER — INDOMETHACIN 50 MG RE SUPP
100.0000 mg | Freq: Once | RECTAL | Status: AC
  Administered 2017-03-11: 100 mg via RECTAL
  Filled 2017-03-11: qty 2

## 2017-03-11 MED ORDER — INDOMETHACIN 50 MG RE SUPP
100.0000 mg | Freq: Once | RECTAL | Status: DC
  Filled 2017-03-11: qty 2

## 2017-03-11 MED ORDER — SODIUM CHLORIDE 0.9 % IV SOLN
INTRAVENOUS | Status: DC
  Administered 2017-03-11: 16:00:00 via INTRAVENOUS

## 2017-03-11 MED ORDER — FENTANYL CITRATE (PF) 100 MCG/2ML IJ SOLN
INTRAMUSCULAR | Status: AC
  Filled 2017-03-11: qty 2

## 2017-03-11 NOTE — Anesthesia Postprocedure Evaluation (Signed)
Anesthesia Post Note  Patient: ANTERO DEROSIA  Procedure(s) Performed: ENDOSCOPIC RETROGRADE CHOLANGIOPANCREATOGRAPHY (ERCP) WITH PROPOFOL (N/A )  Patient location during evaluation: Endoscopy Anesthesia Type: General Level of consciousness: awake and alert Pain management: pain level controlled Vital Signs Assessment: post-procedure vital signs reviewed and stable Respiratory status: spontaneous breathing, nonlabored ventilation, respiratory function stable and patient connected to nasal cannula oxygen Cardiovascular status: blood pressure returned to baseline and stable Postop Assessment: no apparent nausea or vomiting Anesthetic complications: no     Last Vitals:  Vitals:   03/11/17 1708 03/11/17 1718  BP: 128/88 115/84  Pulse: 69 (!) 58  Resp: 13 13  Temp:    SpO2: 99% 100%    Last Pain:  Vitals:   03/11/17 1658  TempSrc:   PainSc: 2                  Precious Haws Rivkah Wolz

## 2017-03-11 NOTE — Progress Notes (Signed)
Patient ID: Travis Hawkins, male   DOB: 1976/02/10, 41 y.o.   MRN: [id number]   [facility] PROGRESS NOTE  Travis Hawkins WHQ:[id number] DOB: Dec 25, 1975 DOA: 03/09/2017 PCP: Patient, No Pcp Per  HPI/Subjective: Patient feeling not as good today.  Slight abdominal discomfort.  Objective: Vitals:   03/11/17 1300 03/11/17 1547  BP: 98/72 107/69  Pulse: (!) 52 (!) 58  Resp: 18 20  Temp: 98.2 F (36.8 C) 98.3 F (36.8 C)  SpO2: 98% 100%    Filed Weights   03/08/17 2231 03/09/17 0454 03/11/17 1547  Weight: 92.5 kg (204 lb) 90.3 kg (199 lb 1.6 oz) 90.4 kg (199 lb 6 oz)    ROS: Review of Systems  Constitutional: Negative for chills and fever.  Eyes: Negative for blurred vision.  Respiratory: Negative for cough and shortness of breath.   Cardiovascular: Negative for chest pain.  Gastrointestinal: Positive for abdominal pain. Negative for constipation, diarrhea, nausea and vomiting.  Genitourinary: Negative for dysuria.  Musculoskeletal: Negative for joint pain.  Neurological: Negative for dizziness and headaches.   Exam: Physical Exam  Constitutional: He is oriented to person, place, and time.  HENT:  Nose: No mucosal edema.  Mouth/Throat: No oropharyngeal exudate or posterior oropharyngeal edema.  Eyes: EOM and lids are normal. Pupils are equal, round, and reactive to light.  Icteric  Neck: No JVD present. Carotid bruit is not present. No edema present. No thyroid mass and no thyromegaly present.  Cardiovascular: S1 normal and S2 normal. Exam reveals no gallop.  No murmur heard. Pulses:      Dorsalis pedis pulses are 2+ on the right side, and 2+ on the left side.  Respiratory: No respiratory distress. He has no wheezes. He has no rhonchi. He has no rales.  GI: Soft. Bowel sounds are normal. There is tenderness in the right upper quadrant.  Musculoskeletal:       Right ankle: He exhibits no swelling.       Left ankle: He exhibits no swelling.  Lymphadenopathy:    He  has no cervical adenopathy.  Neurological: He is alert and oriented to person, place, and time. No cranial nerve deficit.  Skin: Skin is warm. Nails show no clubbing.  Jaundiced  Psychiatric: He has a normal mood and affect.      Data Reviewed: Basic Metabolic Panel: Recent Labs  Lab 03/08/17 2233 03/09/17 0516 03/10/17 0810 03/11/17 0303  NA 135 136 136 137  K 3.8 3.7 4.4 3.9  CL 104 105 106 105  CO2 21* 22 22 25   GLUCOSE 131* 168* 109* 140*  BUN 9 8 9 8   CREATININE 0.65 0.72 0.60* 0.70  CALCIUM 9.3 9.1 9.4 8.8*   Liver Function Tests: Recent Labs  Lab 03/08/17 2233 03/09/17 0516 03/10/17 0810 03/11/17 0303  AST 144* 136* 115* 97*  ALT 347* 325* 292* 239*  ALKPHOS 258* 252* 250* 216*  BILITOT 10.2* 10.2* 10.5* 8.8*  PROT 8.1 7.7 7.8 7.2  ALBUMIN 4.0 3.9 3.8 3.5   Recent Labs  Lab 03/08/17 2233 03/10/17 0810 03/11/17 0303  LIPASE 619* 395* 571*   CBC: Recent Labs  Lab 03/08/17 2233 03/09/17 0516  WBC 6.5 5.6  HGB 13.9 13.8  HCT 41.5 40.5  MCV 97.4 96.9  PLT 220 212     Studies: No results found.  Scheduled Meds: . docusate sodium  100 mg Oral BID  . [START ON 03/12/2017] enoxaparin (LOVENOX) injection  40 mg Subcutaneous Q24H  . indomethacin  100  mg Rectal Once  . indomethacin  100 mg Rectal Once  . lamoTRIgine  50 mg Oral QHS  . OLANZapine  10 mg Oral QHS  . sodium chloride flush  3 mL Intravenous Q12H   Continuous Infusions: . sodium chloride    . sodium chloride 100 mL/hr at 03/11/17 0338  . sodium chloride    . sodium chloride    . ampicillin-sulbactam (UNASYN) 1.5 g IVPB      Assessment/Plan:  1. Jaundice, elevated liver function test.  MRCP shows dilated ducts, no stones.  Also commented on sludge in the biliary tree.  Rule out stricture.  ERCP scheduled for Monday.  Total bilirubin down a little bit from today 2. Pancreatitis.  IV fluid hydration.  Lipase a little higher today.  Patient n.p.o. today for procedure 3. Mood  disorder on lamotrigine and olanzapine 4. Impaired fasting glucose.  Added on hemoglobin A1c 5. Itching.  As needed Atarax  Code Status:     Code Status Orders  (From admission, onward)        Start     Ordered   03/09/17 0449  Full code  Continuous     03/09/17 0448    Code Status History    Date Active Date Inactive Code Status Order ID Comments User Context   This patient has a current code status but no historical code status.     Disposition Plan: If ERCP is on Monday potential disposition on Tuesday.  Consultants:  Gastroenterology  Time spent: 25 minutes, case discussed with gastroenterology  [place], [facility]

## 2017-03-11 NOTE — Transfer of Care (Signed)
Immediate Anesthesia Transfer of Care Note  Patient: Travis Hawkins  Procedure(s) Performed: ENDOSCOPIC RETROGRADE CHOLANGIOPANCREATOGRAPHY (ERCP) WITH PROPOFOL (N/A )  Patient Location: PACU  Anesthesia Type:General  Level of Consciousness: sedated  Airway & Oxygen Therapy: Patient Spontanous Breathing and Patient connected to nasal cannula oxygen  Post-op Assessment: Report given to RN and Post -op Vital signs reviewed and stable  Post vital signs: Reviewed and stable  Last Vitals:  Vitals:   03/11/17 1300 03/11/17 1547  BP: 98/72 107/69  Pulse: (!) 52 (!) 58  Resp: 18 20  Temp: 36.8 C 36.8 C  SpO2: 98% 100%    Last Pain:  Vitals:   03/11/17 1547  TempSrc: Tympanic  PainSc: 0-No pain      Patients Stated Pain Goal: 0 (34/37/35 7897)  Complications: No apparent anesthesia complications

## 2017-03-11 NOTE — Anesthesia Post-op Follow-up Note (Signed)
Anesthesia QCDR form completed.        

## 2017-03-11 NOTE — Anesthesia Preprocedure Evaluation (Addendum)
Anesthesia Evaluation  Patient identified by MRN, date of birth, ID band Patient awake    Reviewed: Allergy & Precautions, NPO status , Patient's Chart, lab work & pertinent test results, reviewed documented beta blocker date and time   Airway Mallampati: III  TM Distance: >3 FB     Dental  (+) Chipped, Missing   Pulmonary former smoker,           Cardiovascular      Neuro/Psych    GI/Hepatic   Endo/Other    Renal/GU      Musculoskeletal   Abdominal   Peds  Hematology   Anesthesia Other Findings Obese.Anxiety.  Reproductive/Obstetrics                            Anesthesia Physical Anesthesia Plan  ASA: II  Anesthesia Plan: General   Post-op Pain Management:    Induction: Intravenous  PONV Risk Score and Plan:   Airway Management Planned:   Additional Equipment:   Intra-op Plan:   Post-operative Plan:   Informed Consent: I have reviewed the patients History and Physical, chart, labs and discussed the procedure including the risks, benefits and alternatives for the proposed anesthesia with the patient or authorized representative who has indicated his/her understanding and acceptance.     Plan Discussed with: CRNA  Anesthesia Plan Comments:         Anesthesia Quick Evaluation

## 2017-03-11 NOTE — Op Note (Signed)
[facility] Gastroenterology Patient Name: Travis Hawkins Procedure Date: 03/11/2017 4:04 PM MRN: [id number] Account #: [id number] Date of Birth: 1976-04-26 Admit Type: Inpatient Age: 41 Room: [facility] ENDO ROOM 4 Gender: Male Note Status: Finalized Procedure:            ERCP Indications:          Biliary dilation on Computed Tomogram Scan, Elevated                        liver enzymes Providers:            Lucilla Lame MD, MD Referring MD:         No Local Md, MD (Referring MD) Medicines:            Propofol per Anesthesia Complications:        No immediate complications. Procedure:            Pre-Anesthesia Assessment:                       - Prior to the procedure, a History and Physical was                        performed, and patient medications and allergies were                        reviewed. The patient's tolerance of previous                        anesthesia was also reviewed. The risks and benefits of                        the procedure and the sedation options and risks were                        discussed with the patient. All questions were                        answered, and informed consent was obtained. Prior                        Anticoagulants: The patient has taken no previous                        anticoagulant or antiplatelet agents. ASA Grade                        Assessment: II - A patient with mild systemic disease.                        After reviewing the risks and benefits, the patient was                        deemed in satisfactory condition to undergo the                        procedure.                       After obtaining informed consent, the scope was passed  under direct vision. Throughout the procedure, the                        patient's blood pressure, pulse, and oxygen saturations                        were monitored continuously. The ERCP was introduced                        through the  mouth, and used to inject contrast into and                        used to inject contrast into the bile duct. The ERCP                        was accomplished without difficulty. The patient                        tolerated the procedure well. Findings:      The scout film was normal. The major papilla was bulging. The bile duct       was deeply cannulated with the short-nosed traction sphincterotome.       Contrast was injected. I personally interpreted the bile duct images.       There was brisk flow of contrast through the ducts. Image quality was       excellent. Contrast extended to the entire biliary tree. The common bile       duct was moderately dilated, acquired. A wire was passed into the       biliary tree. Biliary sphincterotomy was made with a traction (standard)       sphincterotome using ERBE electrocautery. There was no       post-sphincterotomy bleeding. The biliary tree was swept with a 15 mm       balloon starting at the bifurcation. One stone was removed. No stones       remained. One 10 Fr by 7 cm plastic stent with a single external flap       and a single internal flap was placed 5 cm into the common bile duct.       Bile flowed through the stent. The stent was in good position. Impression:           - The major papilla appeared to be bulging.                       - The common bile duct was moderately dilated, acquired.                       - Choledocholithiasis was found. Complete removal was                        accomplished by biliary sphincterotomy and balloon                        extraction.                       - A biliary sphincterotomy was performed.                       - The biliary tree was swept.                       -  Drainage of bile was slow and would not drain.                       - One plastic stent was placed into the common bile                        duct. Recommendation:       - Clear liquid diet.                       - Watch for  pancreatitis, bleeding, perforation, and                        cholangitis.                       - Repeat ERCP in 2 months to remove stent. Procedure Code(s):    --- Professional ---                       [phone], Endoscopic retrograde cholangiopancreatography                        (ERCP); with placement of endoscopic stent into biliary                        or pancreatic duct, including pre- and post-dilation                        and guide wire passage, when performed, including                        sphincterotomy, when performed, each stent                       28413, Endoscopic retrograde cholangiopancreatography                        (ERCP); with removal of calculi/debris from                        biliary/pancreatic duct(s)                       24401, Endoscopic catheterization of the biliary ductal                        system, radiological supervision and interpretation Diagnosis Code(s):    --- Professional ---                       K80.50, Calculus of bile duct without cholangitis or                        cholecystitis without obstruction                       K83.8, Other specified diseases of biliary tract                       R74.8, Abnormal levels of other serum enzymes CPT copyright 2016 [employer]. All rights reserved. The codes documented in this report are preliminary and upon coder review may  be revised to meet  current compliance requirements. Lucilla Lame MD, MD 03/11/2017 4:54:21 PM This report has been signed electronically. Number of Addenda: 0 Note Initiated On: 03/11/2017 4:04 PM      [facility]

## 2017-03-11 NOTE — Anesthesia Procedure Notes (Signed)
Date/Time: 03/11/2017 4:05 PM Performed by: Allean Found, CRNA Pre-anesthesia Checklist: Patient identified, Emergency Drugs available, Suction available, Patient being monitored and Timeout performed Patient Re-evaluated:Patient Re-evaluated prior to induction Oxygen Delivery Method: Nasal cannula Induction Type: IV induction Placement Confirmation: positive ETCO2 Dental Injury: Teeth and Oropharynx as per pre-operative assessment

## 2017-03-12 ENCOUNTER — Encounter: Payer: Self-pay | Admitting: Gastroenterology

## 2017-03-12 LAB — COMPREHENSIVE METABOLIC PANEL
ALT: 182 U/L — AB (ref 17–63)
AST: 72 U/L — AB (ref 15–41)
Albumin: 3.4 g/dL — ABNORMAL LOW (ref 3.5–5.0)
Alkaline Phosphatase: 203 U/L — ABNORMAL HIGH (ref 38–126)
Anion gap: 7 (ref 5–15)
BUN: 7 mg/dL (ref 6–20)
CHLORIDE: 105 mmol/L (ref 101–111)
CO2: 25 mmol/L (ref 22–32)
CREATININE: 0.67 mg/dL (ref 0.61–1.24)
Calcium: 8.8 mg/dL — ABNORMAL LOW (ref 8.9–10.3)
Glucose, Bld: 112 mg/dL — ABNORMAL HIGH (ref 65–99)
POTASSIUM: 3.9 mmol/L (ref 3.5–5.1)
SODIUM: 137 mmol/L (ref 135–145)
Total Bilirubin: 9.5 mg/dL — ABNORMAL HIGH (ref 0.3–1.2)
Total Protein: 6.9 g/dL (ref 6.5–8.1)

## 2017-03-12 LAB — LIPASE, BLOOD: LIPASE: 338 U/L — AB (ref 11–51)

## 2017-03-12 MED ORDER — SODIUM CHLORIDE 0.9 % IV BOLUS (SEPSIS)
1000.0000 mL | Freq: Once | INTRAVENOUS | Status: AC
  Administered 2017-03-12: 1000 mL via INTRAVENOUS

## 2017-03-12 MED ORDER — HYDROXYZINE HCL 25 MG PO TABS: 25.0000 mg | ORAL_TABLET | Freq: Three times a day (TID) | ORAL | 0 refills | Status: DC | PRN

## 2017-03-12 MED ORDER — ONDANSETRON HCL 4 MG PO TABS: 4.0000 mg | ORAL_TABLET | Freq: Four times a day (QID) | ORAL | 0 refills | Status: DC | PRN

## 2017-03-12 NOTE — Progress Notes (Signed)
Patient ID: Travis Hawkins, male   DOB: Oct 04, 1975, 41 y.o.   MRN: [id number] [place] at [place] was admitted to the [facility] on 03/09/2017 and Discharged  03/12/2017 and should be excused from work/school   for 10 days starting 03/09/2017 , may return to work/school without any restrictions.  Loletha Grayer M.D on 03/12/2017,at 11:03 AM  [place] [facility] [place]

## 2017-03-12 NOTE — Care Management (Signed)
Patient to discharge home today.  Patient does not have insurance or PCP.  Provided with application to [facility], Medication Management , and "The Network:  Your Guide to [employer] and [employer] in [facility]"  Booklet.  Patient to discharge on Atarax and Zofran.  Coupon provided from [email or web address].com  RNCM signing off.

## 2017-03-12 NOTE — Progress Notes (Signed)
Dr.pyreddy called back , informed him of low BP of 80/56 manually. New order for 1 liter Normal Saline Bolus . Travis Hawkins

## 2017-03-12 NOTE — Progress Notes (Signed)
Patient discharge teaching given, including activity, diet, follow-up appoints, and medications. Patient verbalized understanding of all discharge instructions. IV access was d/c'd. Vitals are stable. Skin is intact except as charted in most recent assessments. Pt to be escorted out by RN, to be driven home by family.  Harlym Gehling  

## 2017-03-12 NOTE — Progress Notes (Signed)
Page to Hospitalist  for low BP. Travis Hawkins

## 2017-03-12 NOTE — Discharge Summary (Signed)
[place] at [place] NAME: Travis Hawkins    MR#:  [id number]  DATE OF BIRTH:  07/21/1975  DATE OF ADMISSION:  03/09/2017 ADMITTING PHYSICIAN: Saundra Shelling, MD  DATE OF DISCHARGE: 03/12/2017  2:16 PM  PRIMARY CARE PHYSICIAN: Patient, No Pcp Per    ADMISSION DIAGNOSIS:  Hyperbilirubinemia [E80.6] Jaundice [R17] Biliary obstruction [K83.1] Elevated LFTs [R94.5] Elevated lipase [R74.8]  DISCHARGE DIAGNOSIS:  Active Problems:   Biliary obstruction   Calculus of bile duct without cholecystitis and without obstruction   Other specified diseases of biliary tract   Elevated liver enzymes   SECONDARY DIAGNOSIS:   Past Medical History:  Diagnosis Date  . Mood disorder ([place])     HOSPITAL COURSE:   1.  Jaundice, elevated liver function tests.  MRCP shows dilated ducts and no stones.  The patient had sludge in the biliary tree.  ERCP was done and stent was placed.  Stone was removed from the biliary tree.  Case discussed with Dr. Allen Norris gastroenterology.  He will follow-up as outpatient and check labs.  Bilirubin still up but may take time to come down. 2.  Acute pancreatitis from biliary tree blockage.  This improved from yesterday. 3.  Mood disorder on lamotrigine and olanzapine 4.  Impaired fasting glucose.  Patient is not a diabetic 5.  Itching from hyperbilirubinemia.  Atarax prescribed as outpatient  Patient's insurance will come through in December and then he will set up with her primary care physician  DISCHARGE CONDITIONS:   Satisfactory  CONSULTS OBTAINED:  Treatment Team:  Lucilla Lame, MD  DRUG ALLERGIES:  No Known Allergies  DISCHARGE MEDICATIONS:   Discharge Medication List as of 03/12/2017 10:41 AM    START taking these medications   Details  hydrOXYzine (ATARAX/VISTARIL) 25 MG tablet Take 1 tablet (25 mg total) 3 (three) times daily as needed by mouth for itching., Starting Tue 03/12/2017, Print    ondansetron  (ZOFRAN) 4 MG tablet Take 1 tablet (4 mg total) every 6 (six) hours as needed by mouth for nausea., Starting Tue 03/12/2017, Print      CONTINUE these medications which have NOT CHANGED   Details  lamoTRIgine (LAMICTAL) 25 MG tablet Take 25 mg by mouth at bedtime. Two tabs at bedtime., Historical Med    OLANZapine (ZYPREXA) 10 MG tablet Take 10 mg by mouth at bedtime., Historical Med         DISCHARGE INSTRUCTIONS:   Follow-up with Dr. Allen Norris 1 week  If you experience worsening of your admission symptoms, develop shortness of breath, life threatening emergency, suicidal or homicidal thoughts you must seek medical attention immediately by calling 911 or calling your MD immediately  if symptoms less severe.  You Must read complete instructions/literature along with all the possible adverse reactions/side effects for all the Medicines you take and that have been prescribed to you. Take any new Medicines after you have completely understood and accept all the possible adverse reactions/side effects.   Please note  You were cared for by a hospitalist during your hospital stay. If you have any questions about your discharge medications or the care you received while you were in the hospital after you are discharged, you can call the unit and asked to speak with the hospitalist on call if the hospitalist that took care of you is not available. Once you are discharged, your primary care physician will handle any further medical issues. Please note that NO REFILLS for any discharge medications  will be authorized once you are discharged, as it is imperative that you return to your primary care physician (or establish a relationship with a primary care physician if you do not have one) for your aftercare needs so that they can reassess your need for medications and monitor your lab values.    Today   CHIEF COMPLAINT:   Chief Complaint  Patient presents with  . Abdominal Pain    HISTORY OF  PRESENT ILLNESS:  Travis Hawkins  is a 41 y.o. male presented with abdominal pain and found to be jaundiced   VITAL SIGNS:  Blood pressure 95/62, pulse (!) 58, temperature 98 F (36.7 C), temperature source Oral, resp. rate 18, height 5\' 8"  (1.727 m), weight 90.4 kg (199 lb 6 oz), SpO2 99 %.   PHYSICAL EXAMINATION:  GENERAL:  41 y.o.-year-old patient lying in the bed with no acute distress.  EYES: Pupils equal, round, reactive to light and accommodation.  Positive for scleral icterus. Extraocular muscles intact.  HEENT: Head atraumatic, normocephalic. Oropharynx and nasopharynx clear.  NECK:  Supple, no jugular venous distention. No thyroid enlargement, no tenderness.  LUNGS: Normal breath sounds bilaterally, no wheezing, rales,rhonchi or crepitation. No use of accessory muscles of respiration.  CARDIOVASCULAR: S1, S2 normal. No murmurs, rubs, or gallops.  ABDOMEN: Soft, non-tender, non-distended. Bowel sounds present. No organomegaly or mass.  EXTREMITIES: No pedal edema, cyanosis, or clubbing.  NEUROLOGIC: Cranial nerves II through XII are intact. Muscle strength 5/5 in all extremities. Sensation intact. Gait not checked.  PSYCHIATRIC: The patient is alert and oriented x 3.  SKIN: No obvious rash, lesion, or ulcer.   DATA REVIEW:   CBC Recent Labs  Lab 03/09/17 0516  WBC 5.6  HGB 13.8  HCT 40.5  PLT 212    Chemistries  Recent Labs  Lab 03/12/17 0415  NA 137  K 3.9  CL 105  CO2 25  GLUCOSE 112*  BUN 7  CREATININE 0.67  CALCIUM 8.8*  AST 72*  ALT 182*  ALKPHOS 203*  BILITOT 9.5*      Management plans discussed with the patient, and he is in agreement.  CODE STATUS:  Code Status History    Date Active Date Inactive Code Status Order ID Comments User Context   03/09/2017 04:48 03/12/2017 17:16 Full Code [id number]  Saundra Shelling, MD Inpatient      TOTAL TIME TAKING CARE OF THIS PATIENT: 35 minutes.    Loletha Grayer M.D on 03/12/2017 at 5:21  PM  Between 7am to 6pm - Pager - [phone]  After 6pm go to [email or web address] - password [email or web address] [facility]  [facility] [employer] [facility]  [phone]  CC: Primary care physician; Patient, No Pcp Per

## 2017-03-13 ENCOUNTER — Encounter: Payer: Self-pay | Admitting: Intensive Care

## 2017-03-13 ENCOUNTER — Emergency Department: Payer: Self-pay

## 2017-03-13 ENCOUNTER — Inpatient Hospital Stay
Admission: EM | Admit: 2017-03-13 | Discharge: 2017-03-20 | DRG: 444 | Disposition: A | Discharge status: Home / Self Care | Payer: PRIVATE HEALTH INSURANCE | Attending: Internal Medicine | Admitting: Internal Medicine

## 2017-03-13 DIAGNOSIS — F329 Major depressive disorder, single episode, unspecified: Secondary | ICD-10-CM | POA: Diagnosis present

## 2017-03-13 DIAGNOSIS — K831 Obstruction of bile duct: Secondary | ICD-10-CM

## 2017-03-13 DIAGNOSIS — Z801 Family history of malignant neoplasm of trachea, bronchus and lung: Secondary | ICD-10-CM

## 2017-03-13 DIAGNOSIS — Z803 Family history of malignant neoplasm of breast: Secondary | ICD-10-CM

## 2017-03-13 DIAGNOSIS — Z833 Family history of diabetes mellitus: Secondary | ICD-10-CM

## 2017-03-13 DIAGNOSIS — Z4659 Encounter for fitting and adjustment of other gastrointestinal appliance and device: Secondary | ICD-10-CM

## 2017-03-13 DIAGNOSIS — K810 Acute cholecystitis: Secondary | ICD-10-CM | POA: Diagnosis not present

## 2017-03-13 DIAGNOSIS — R7401 Elevation of levels of liver transaminase levels: Secondary | ICD-10-CM

## 2017-03-13 DIAGNOSIS — R740 Nonspecific elevation of levels of transaminase and lactic acid dehydrogenase [LDH]: Secondary | ICD-10-CM | POA: Diagnosis not present

## 2017-03-13 DIAGNOSIS — R7301 Impaired fasting glucose: Secondary | ICD-10-CM | POA: Diagnosis present

## 2017-03-13 DIAGNOSIS — E876 Hypokalemia: Secondary | ICD-10-CM | POA: Diagnosis present

## 2017-03-13 DIAGNOSIS — F17290 Nicotine dependence, other tobacco product, uncomplicated: Secondary | ICD-10-CM | POA: Diagnosis present

## 2017-03-13 DIAGNOSIS — Z79899 Other long term (current) drug therapy: Secondary | ICD-10-CM

## 2017-03-13 DIAGNOSIS — K7689 Other specified diseases of liver: Secondary | ICD-10-CM

## 2017-03-13 DIAGNOSIS — K858 Other acute pancreatitis without necrosis or infection: Secondary | ICD-10-CM | POA: Diagnosis not present

## 2017-03-13 DIAGNOSIS — K7589 Other specified inflammatory liver diseases: Secondary | ICD-10-CM | POA: Diagnosis present

## 2017-03-13 DIAGNOSIS — K851 Biliary acute pancreatitis without necrosis or infection: Secondary | ICD-10-CM | POA: Diagnosis present

## 2017-03-13 DIAGNOSIS — L299 Pruritus, unspecified: Secondary | ICD-10-CM | POA: Diagnosis not present

## 2017-03-13 DIAGNOSIS — K859 Acute pancreatitis without necrosis or infection, unspecified: Secondary | ICD-10-CM | POA: Insufficient documentation

## 2017-03-13 HISTORY — DX: Major depressive disorder, single episode, unspecified: F32.9

## 2017-03-13 HISTORY — DX: Depression, unspecified: F32.A

## 2017-03-13 HISTORY — DX: Obstruction of bile duct: K83.1

## 2017-03-13 LAB — COMPREHENSIVE METABOLIC PANEL
ALT: 153 U/L — ABNORMAL HIGH (ref 17–63)
ANION GAP: 12 (ref 5–15)
AST: 66 U/L — ABNORMAL HIGH (ref 15–41)
Albumin: 3.8 g/dL (ref 3.5–5.0)
Alkaline Phosphatase: 304 U/L — ABNORMAL HIGH (ref 38–126)
BUN: 9 mg/dL (ref 6–20)
CHLORIDE: 101 mmol/L (ref 101–111)
CO2: 22 mmol/L (ref 22–32)
Calcium: 9.3 mg/dL (ref 8.9–10.3)
Creatinine, Ser: 0.77 mg/dL (ref 0.61–1.24)
GFR calc non Af Amer: >60 mL/min (ref >60)
Glucose, Bld: 153 mg/dL — ABNORMAL HIGH (ref 65–99)
POTASSIUM: 3.6 mmol/L (ref 3.5–5.1)
SODIUM: 135 mmol/L (ref 135–145)
Total Bilirubin: 11 mg/dL — ABNORMAL HIGH (ref 0.3–1.2)
Total Protein: 7.2 g/dL (ref 6.5–8.1)

## 2017-03-13 LAB — URINALYSIS, COMPLETE (UACMP) WITH MICROSCOPIC
Bacteria, UA: NONE SEEN
Glucose, UA: NEGATIVE mg/dL
HGB URINE DIPSTICK: NEGATIVE
KETONES UR: NEGATIVE mg/dL
LEUKOCYTES UA: NEGATIVE
Nitrite: NEGATIVE
PROTEIN: 30 mg/dL — AB
Specific Gravity, Urine: 1.018 (ref 1.005–1.030)
pH: 5 (ref 5.0–8.0)

## 2017-03-13 LAB — CBC
HCT: 42 % (ref 40.0–52.0)
HEMOGLOBIN: 14.2 g/dL (ref 13.0–18.0)
MCH: 32.8 pg (ref 26.0–34.0)
MCHC: 33.7 g/dL (ref 32.0–36.0)
MCV: 97.3 fL (ref 80.0–100.0)
Platelets: 240 10*3/uL (ref 150–440)
RBC: 4.32 MIL/uL — AB (ref 4.40–5.90)
RDW: 15.6 % — ABNORMAL HIGH (ref 11.5–14.5)
WBC: 7.1 10*3/uL (ref 3.8–10.6)

## 2017-03-13 LAB — PROTIME-INR
INR: 0.97
Prothrombin Time: 12.8 seconds (ref 11.4–15.2)

## 2017-03-13 LAB — LIPASE, BLOOD: LIPASE: 1176 U/L — AB (ref 11–51)

## 2017-03-13 MED ORDER — IOPAMIDOL (ISOVUE-300) INJECTION 61%
100.0000 mL | Freq: Once | INTRAVENOUS | Status: AC | PRN
  Administered 2017-03-13: 100 mL via INTRAVENOUS

## 2017-03-13 MED ORDER — ENOXAPARIN SODIUM 40 MG/0.4ML ~~LOC~~ SOLN
40.0000 mg | SUBCUTANEOUS | Status: DC
  Administered 2017-03-13 – 2017-03-19 (×7): 40 mg via SUBCUTANEOUS
  Filled 2017-03-13 (×7): qty 0.4

## 2017-03-13 MED ORDER — ACETAMINOPHEN 650 MG RE SUPP: 650.0000 mg | Freq: Four times a day (QID) | RECTAL | Status: DC | PRN

## 2017-03-13 MED ORDER — OXYCODONE HCL 5 MG PO TABS
5.0000 mg | ORAL_TABLET | ORAL | Status: DC | PRN
  Administered 2017-03-13 – 2017-03-15 (×3): 5 mg via ORAL
  Filled 2017-03-13 (×3): qty 1

## 2017-03-13 MED ORDER — PIPERACILLIN-TAZOBACTAM 3.375 G IVPB
3.3750 g | Freq: Three times a day (TID) | INTRAVENOUS | Status: DC
  Administered 2017-03-13 – 2017-03-16 (×8): 3.375 g via INTRAVENOUS
  Filled 2017-03-13 (×8): qty 50

## 2017-03-13 MED ORDER — MORPHINE SULFATE (PF) 4 MG/ML IV SOLN
4.0000 mg | INTRAVENOUS | Status: DC | PRN
  Filled 2017-03-13: qty 1

## 2017-03-13 MED ORDER — PROMETHAZINE HCL 25 MG/ML IJ SOLN
12.5000 mg | Freq: Four times a day (QID) | INTRAMUSCULAR | Status: DC | PRN
  Administered 2017-03-13 – 2017-03-14 (×3): 12.5 mg via INTRAVENOUS
  Filled 2017-03-13 (×3): qty 1

## 2017-03-13 MED ORDER — HYDROXYZINE HCL 25 MG PO TABS
25.0000 mg | ORAL_TABLET | Freq: Three times a day (TID) | ORAL | Status: DC | PRN
  Administered 2017-03-13 – 2017-03-16 (×4): 25 mg via ORAL
  Filled 2017-03-13 (×4): qty 1

## 2017-03-13 MED ORDER — ACETAMINOPHEN 325 MG PO TABS: 650.0000 mg | ORAL_TABLET | Freq: Four times a day (QID) | ORAL | Status: DC | PRN

## 2017-03-13 MED ORDER — PIPERACILLIN-TAZOBACTAM 3.375 G IVPB 30 MIN
3.3750 g | Freq: Once | INTRAVENOUS | Status: AC
  Administered 2017-03-13: 3.375 g via INTRAVENOUS

## 2017-03-13 MED ORDER — SODIUM CHLORIDE 0.9 % IV BOLUS (SEPSIS)
1000.0000 mL | Freq: Once | INTRAVENOUS | Status: AC
  Administered 2017-03-13: 1000 mL via INTRAVENOUS

## 2017-03-13 MED ORDER — ONDANSETRON 4 MG PO TBDP: 4.0000 mg | ORAL_TABLET | Freq: Once | ORAL | Status: DC | PRN

## 2017-03-13 MED ORDER — LAMOTRIGINE 25 MG PO TABS
25.0000 mg | ORAL_TABLET | Freq: Every day | ORAL | Status: DC
  Administered 2017-03-13 – 2017-03-14 (×2): 25 mg via ORAL
  Filled 2017-03-13 (×2): qty 1

## 2017-03-13 MED ORDER — OLANZAPINE 10 MG PO TABS
10.0000 mg | ORAL_TABLET | Freq: Every day | ORAL | Status: DC
  Administered 2017-03-14 – 2017-03-19 (×7): 10 mg via ORAL
  Filled 2017-03-13 (×8): qty 1

## 2017-03-13 MED ORDER — POTASSIUM CHLORIDE IN NACL 20-0.9 MEQ/L-% IV SOLN
INTRAVENOUS | Status: DC
  Administered 2017-03-13 – 2017-03-18 (×9): via INTRAVENOUS
  Filled 2017-03-13 (×15): qty 1000

## 2017-03-13 MED ORDER — FENTANYL CITRATE (PF) 100 MCG/2ML IJ SOLN
25.0000 ug | INTRAMUSCULAR | Status: DC | PRN
  Administered 2017-03-13: 25 ug via INTRAVENOUS
  Filled 2017-03-13: qty 2

## 2017-03-13 MED ORDER — ONDANSETRON HCL 4 MG/2ML IJ SOLN
4.0000 mg | Freq: Four times a day (QID) | INTRAMUSCULAR | Status: DC | PRN
  Administered 2017-03-14 – 2017-03-19 (×4): 4 mg via INTRAVENOUS
  Filled 2017-03-13 (×4): qty 2

## 2017-03-13 MED ORDER — DIPHENHYDRAMINE HCL 25 MG PO CAPS
50.0000 mg | ORAL_CAPSULE | Freq: Once | ORAL | Status: AC
  Administered 2017-03-13: 50 mg via ORAL
  Filled 2017-03-13: qty 2

## 2017-03-13 MED ORDER — FENTANYL CITRATE (PF) 100 MCG/2ML IJ SOLN
50.0000 ug | INTRAMUSCULAR | Status: DC | PRN
  Administered 2017-03-13: 50 ug via INTRAVENOUS
  Filled 2017-03-13: qty 2

## 2017-03-13 NOTE — H&P (Signed)
[place] at [place] NAME: Travis Hawkins    MR#:  [id number]  DATE OF BIRTH:  05-28-1975  DATE OF ADMISSION:  03/13/2017  PRIMARY CARE PHYSICIAN: Patient, No Pcp Per   REQUESTING/REFERRING PHYSICIAN: Dr Merlyn Lot  CHIEF COMPLAINT:   Chief Complaint  Patient presents with  . Abdominal Pain    HISTORY OF PRESENT ILLNESS:  Travis Hawkins  is a 41 y.o. male with recent ERCP and stent placement for jaundice and biliary obstruction.  The patient went home and has been lying around.  He started developing abdominal pain.  He has a lot of nausea.  Pain is worse with moving around.  Still having itching.  Felt worse today and came back to the hospital.  He states his hands have been burning.  In the ER, laboratory data showed a worsening lipase and slightly worse bilirubin.  A repeat CT scan shows common bile duct stent in appropriate position.  Edematous gallbladder with a small amount of pericholecystic fluid and inflammatory changes seen.  Hospitalist services were contacted for further evaluation.  PAST MEDICAL HISTORY:   Past Medical History:  Diagnosis Date  . Biliary obstruction   . Depression   . Mood disorder ([place])     PAST SURGICAL HISTORY:   Past Surgical History:  Procedure Laterality Date  . none      SOCIAL HISTORY:   Social History   Tobacco Use  . Smoking status: Current Every Day Smoker    Packs/day: 1.00    Years: 18.00    Pack years: 18.00    Types: E-cigarettes  . Smokeless tobacco: Never Used  Substance Use Topics  . Alcohol use: Yes    Comment: Occasional wine about every 3-4 months    FAMILY HISTORY:   Family History  Problem Relation Age of Onset  . Cancer Maternal Aunt   . Breast cancer Maternal Grandmother   . Lung cancer Maternal Grandfather   . Diabetes Mellitus II Mother     DRUG ALLERGIES:  No Known Allergies  REVIEW OF SYSTEMS:  CONSTITUTIONAL: No fever, positive for  fatigue.  EYES: No blurred or double vision.  EARS, NOSE, AND THROAT: No tinnitus or ear pain. No sore throat RESPIRATORY: No cough, shortness of breath, wheezing or hemoptysis.  CARDIOVASCULAR: No chest pain, orthopnea, edema.  GASTROINTESTINAL: Positive for nausea, vomiting, and abdominal pain. No blood in bowel movements GENITOURINARY: Positive for dysuria, and dark urine ENDOCRINE: No polyuria, nocturia,  HEMATOLOGY: No anemia, easy bruising or bleeding SKIN: Positive for jaundice and itching MUSCULOSKELETAL: No joint pain or arthritis.   NEUROLOGIC: No tingling, numbness, weakness.  PSYCHIATRY: On medication for mood disorder  MEDICATIONS AT HOME:   Prior to Admission medications   Medication Sig Start Date End Date Taking? Authorizing Provider  hydrOXYzine (ATARAX/VISTARIL) 25 MG tablet Take 1 tablet (25 mg total) 3 (three) times daily as needed by mouth for itching. 03/12/17   Loletha Grayer, MD  lamoTRIgine (LAMICTAL) 25 MG tablet Take 25 mg by mouth at bedtime. Two tabs at bedtime.    [provider]  OLANZapine (ZYPREXA) 10 MG tablet Take 10 mg by mouth at bedtime.    [provider]  ondansetron (ZOFRAN) 4 MG tablet Take 1 tablet (4 mg total) every 6 (six) hours as needed by mouth for nausea. 03/12/17   Loletha Grayer, MD      VITAL SIGNS:  Blood pressure 109/74, pulse (!) 56, temperature 98.6 F (37 C),  temperature source Oral, resp. rate 18, height 5\' 8"  (1.727 m), weight 90.3 kg (199 lb), SpO2 98 %.  PHYSICAL EXAMINATION:  GENERAL:  41 y.o.-year-old patient lying in the bed with no acute distress.  EYES: Pupils equal, round, reactive to light and accommodation.  Positive for scleral icterus. Extraocular muscles intact.  HEENT: Head atraumatic, normocephalic. Oropharynx and nasopharynx clear.  NECK:  Supple, no jugular venous distention. No thyroid enlargement, no tenderness.  LUNGS: Normal breath sounds bilaterally, no wheezing, rales,rhonchi or  crepitation. No use of accessory muscles of respiration.  CARDIOVASCULAR: S1, S2 normal. No murmurs, rubs, or gallops.  ABDOMEN: Soft, right upper quadrant and epigastric tenderness, distended. Bowel sounds present. No organomegaly or mass.  EXTREMITIES: No pedal edema, cyanosis, or clubbing.  NEUROLOGIC: Cranial nerves II through XII are intact. Muscle strength 5/5 in all extremities. Sensation intact. Gait not checked.  PSYCHIATRIC: The patient is alert and oriented x 3.  SKIN: No rash, lesion, or ulcer.   LABORATORY PANEL:   CBC Recent Labs  Lab 03/13/17 1506  WBC 7.1  HGB 14.2  HCT 42.0  PLT 240   ------------------------------------------------------------------------------------------------------------------  Chemistries  Recent Labs  Lab 03/13/17 1506  NA 135  K 3.6  CL 101  CO2 22  GLUCOSE 153*  BUN 9  CREATININE 0.77  CALCIUM 9.3  AST 66*  ALT 153*  ALKPHOS 304*  BILITOT 11.0*   ------------------------------------------------------------------------------------------------------------------    RADIOLOGY:  Ct Abdomen Pelvis W Contrast  Result Date: 03/13/2017 CLINICAL DATA:  Abdominal pain, nausea and vomiting increasing in the last week. Status post ERCP with stent placement on 03/11/2017 and biliary stone removal. EXAM: CT ABDOMEN AND PELVIS WITH CONTRAST TECHNIQUE: Multidetector CT imaging of the abdomen and pelvis was performed using the standard protocol following bolus administration of intravenous contrast. CONTRAST:  159mL ISOVUE-300 IOPAMIDOL (ISOVUE-300) INJECTION 61% COMPARISON:  MRI of the abdomen 03/09/2017 FINDINGS: Lower chest: No acute abnormality. Hepatobiliary: Normal appearance of the liver. Gallbladder wall edema measuring 5 mm. Small amount of pericholecystic fluid. Tiny foci of gas within the gallbladder lumen. Common bile duct stent terminates in the second portion of the duodenum. No evidence of biliary ductal dilation or pancreatic duct  dilation. Pancreas: Unremarkable. No pancreatic ductal dilatation or surrounding inflammatory changes. Spleen: Normal in size without focal abnormality. Adrenals/Urinary Tract: Adrenal glands are unremarkable. Kidneys are normal, without renal calculi, focal lesion, or hydronephrosis. Bladder is unremarkable. Stomach/Bowel: Stomach is within normal limits. Appendix appears normal. No evidence of bowel wall thickening, distention, or inflammatory changes. Vascular/Lymphatic: No significant vascular findings are present. No enlarged abdominal or pelvic lymph nodes. Reproductive: Prostate is unremarkable. Other: Periduodenal and periportal inflammatory changes. Musculoskeletal: No acute or significant osseous findings. IMPRESSION: Common bile duct stent in appropriate position. Edematous gallbladder wall with small amount of pericholecystic fluid and inflammatory changes in porta hepatitis and periduodenal locations. Findings are most concerning for acalculus cholecystitis/ cholangitis. Electronically Signed   By: Fidela Salisbury M.D.   On: 03/13/2017 18:00    EKG:   Ordered  IMPRESSION AND PLAN:   1.  Acalculous cholecystitis, jaundice with biliary obstruction status post ERCP and stent, elevated liver function test.  Blood cultures ordered.  Empiric IV Zosyn.  Case discussed with general surgery and they would like to see how the patient progresses with antibiotics.  General surgery would not recommend removing the gallbladder currently with elevation of liver function test and lipase.  If symptoms do not improve can consider a percutaneous gallbladder drain. 2.  Acute pancreatitis secondary to biliary obstruction.  IV fluid hydration and n.p.o. at this point. 3.  Hypokalemia replace potassium and IV fluids 4.  Mood disorder on lamotrigine and olanzapine 5.  Impaired fasting glucose.  The patient is not a diabetic. 6.  Itching from hyperbilirubinemia.  As needed Atarax.  All the records are  reviewed and case discussed with ED provider. Management plans discussed with the patient, family and they are in agreement.  CODE STATUS: Full code  TOTAL TIME TAKING CARE OF THIS PATIENT: 50 minutes.    Loletha Grayer M.D on 03/13/2017 at 7:04 PM  Between 7am to 6pm - Pager - [phone]  After 6pm call admission pager [phone]  [employer] [facility]  [phone]  CC: Primary care physician; Patient, No Pcp Per

## 2017-03-13 NOTE — Consult Note (Signed)
Date of Consultation:  03/13/2017  Requesting Physician:  Merlyn Lot, MD  Reason for Consultation:  Cholecystitis, elevated LFTs.  History of Present Illness: Travis Hawkins is a 41 y.o. male who was recently admitted with elevated LFTs and pancreatitis and underwent an ERCP with Dr. Allen Norris on 11/12.  This found choledocholithiasis and bile sludge which was swept but also placed a stent due to poor bile flow.  His total bilirubin was slowly decreasing as well as his pancreatitis was improving and was discharged to home yesterday 11/13.  However, the patient reports that yesterday on discharge he was feeling worse than on admission on 11/10.    At home yesterday, he continued having nausea and "knots" on his abdomen with central abdominal pain.  He reports that moving around would bring up more pain and nausea and so basically he layed flat all day.  Today, while walking in the hallway, he felt dizzy and fell and at that point decision was made to come back to ED.  He reports feeling warm but does not think he had a fever. He does feel chills at times.  He does report dysuria this afternoon.  His pain is in the mid abdomen and does not radiate.  He still feels itchy.  Past Medical History: Past Medical History:  Diagnosis Date  . Depression   . Mood disorder [facility])      Past Surgical History: Past Surgical History:  Procedure Laterality Date  . ERCP      Home Medications: Prior to Admission medications   Medication Sig Start Date End Date Taking? Authorizing Provider  hydrOXYzine (ATARAX/VISTARIL) 25 MG tablet Take 1 tablet (25 mg total) 3 (three) times daily as needed by mouth for itching. 03/12/17   Loletha Grayer, MD  lamoTRIgine (LAMICTAL) 25 MG tablet Take 25 mg by mouth at bedtime. Two tabs at bedtime.    [provider]  OLANZapine (ZYPREXA) 10 MG tablet Take 10 mg by mouth at bedtime.    [provider]  ondansetron (ZOFRAN) 4 MG tablet Take 1 tablet (4 mg  total) every 6 (six) hours as needed by mouth for nausea. 03/12/17   Loletha Grayer, MD    Allergies: No Known Allergies  Social History:  reports that he has been smoking e-cigarettes.  He has a 18.00 pack-year smoking history. he has never used smokeless tobacco. He reports that he drinks alcohol. He reports that he does not use drugs.   Family History: Family History  Problem Relation Age of Onset  . Cancer Maternal Aunt   . Breast cancer Maternal Grandmother   . Lung cancer Maternal Grandfather   . Diabetes Mellitus II Mother     Review of Systems: Review of Systems  Constitutional: Positive for chills. Negative for fever.  HENT: Negative for hearing loss.   Eyes: Negative for blurred vision.  Respiratory: Negative for shortness of breath.   Cardiovascular: Negative for chest pain.  Gastrointestinal: Positive for abdominal pain, nausea and vomiting.  Genitourinary: Positive for dysuria.  Musculoskeletal: Negative for myalgias.  Skin: Positive for itching. Negative for rash.  Neurological: Positive for dizziness.  Psychiatric/Behavioral: Negative for depression.  All other systems reviewed and are negative.   Physical Exam BP 109/74   Pulse (!) 56   Temp 98.6 F (37 C) (Oral)   Resp 18   Ht 5\' 8"  (1.727 m)   Wt 90.3 kg (199 lb)   SpO2 98%   BMI 30.26 kg/m  CONSTITUTIONAL: No acute distress  HEENT:  Normocephalic, atraumatic, extraocular motion intact, icteric sclera. NECK: Trachea is midline, and there is no jugular venous distension.  RESPIRATORY:  Lungs are clear, and breath sounds are equal bilaterally. Normal respiratory effort without pathologic use of accessory muscles. CARDIOVASCULAR: Heart is regular without murmurs, gallops, or rubs. GI: The abdomen is soft, mildly distended, with tenderness to palpation in mid abdomen, epigastric areas, and right upper quadrant.  MUSCULOSKELETAL:  Normal muscle strength and tone in all four extremities.  No peripheral  edema or cyanosis. SKIN:  Jaundiced.  NEUROLOGIC:  Motor and sensation is grossly normal.  Cranial nerves are grossly intact. PSYCH:  Alert and oriented to person, place and time. Affect is normal.  Laboratory Analysis: Results for orders placed or performed during the hospital encounter of 03/13/17 (from the past 24 hour(s))  Lipase, blood     Status: Abnormal   Collection Time: 03/13/17  3:06 PM  Result Value Ref Range   Lipase 1,176 (H) 11 - 51 U/L  Comprehensive metabolic panel     Status: Abnormal   Collection Time: 03/13/17  3:06 PM  Result Value Ref Range   Sodium 135 135 - 145 mmol/L   Potassium 3.6 3.5 - 5.1 mmol/L   Chloride 101 101 - 111 mmol/L   CO2 22 22 - 32 mmol/L   Glucose, Bld 153 (H) 65 - 99 mg/dL   BUN 9 6 - 20 mg/dL   Creatinine, Ser 0.77 0.61 - 1.24 mg/dL   Calcium 9.3 8.9 - 10.3 mg/dL   Total Protein 7.2 6.5 - 8.1 g/dL   Albumin 3.8 3.5 - 5.0 g/dL   AST 66 (H) 15 - 41 U/L   ALT 153 (H) 17 - 63 U/L   Alkaline Phosphatase 304 (H) 38 - 126 U/L   Total Bilirubin 11.0 (H) 0.3 - 1.2 mg/dL   GFR calc non Af Amer >60 >60 mL/min   GFR calc Af Amer >60 >60 mL/min   Anion gap 12 5 - 15  CBC     Status: Abnormal   Collection Time: 03/13/17  3:06 PM  Result Value Ref Range   WBC 7.1 3.8 - 10.6 K/uL   RBC 4.32 (L) 4.40 - 5.90 MIL/uL   Hemoglobin 14.2 13.0 - 18.0 g/dL   HCT 42.0 40.0 - 52.0 %   MCV 97.3 80.0 - 100.0 fL   MCH 32.8 26.0 - 34.0 pg   MCHC 33.7 32.0 - 36.0 g/dL   RDW 15.6 (H) 11.5 - 14.5 %   Platelets 240 150 - 440 K/uL  Urinalysis, Complete w Microscopic     Status: Abnormal   Collection Time: 03/13/17  3:06 PM  Result Value Ref Range   Color, Urine AMBER (A) YELLOW   APPearance CLEAR (A) CLEAR   Specific Gravity, Urine 1.018 1.005 - 1.030   pH 5.0 5.0 - 8.0   Glucose, UA NEGATIVE NEGATIVE mg/dL   Hgb urine dipstick NEGATIVE NEGATIVE   Bilirubin Urine MODERATE (A) NEGATIVE   Ketones, ur NEGATIVE NEGATIVE mg/dL   Protein, ur 30 (A) NEGATIVE  mg/dL   Nitrite NEGATIVE NEGATIVE   Leukocytes, UA NEGATIVE NEGATIVE   RBC / HPF 0-5 0 - 5 RBC/hpf   WBC, UA 0-5 0 - 5 WBC/hpf   Bacteria, UA NONE SEEN NONE SEEN   Squamous Epithelial / LPF 0-5 (A) NONE SEEN    Imaging: Ct Abdomen Pelvis W Contrast  Result Date: 03/13/2017 CLINICAL DATA:  Abdominal pain, nausea and vomiting increasing in the  last week. Status post ERCP with stent placement on 03/11/2017 and biliary stone removal. EXAM: CT ABDOMEN AND PELVIS WITH CONTRAST TECHNIQUE: Multidetector CT imaging of the abdomen and pelvis was performed using the standard protocol following bolus administration of intravenous contrast. CONTRAST:  182mL ISOVUE-300 IOPAMIDOL (ISOVUE-300) INJECTION 61% COMPARISON:  MRI of the abdomen 03/09/2017 FINDINGS: Lower chest: No acute abnormality. Hepatobiliary: Normal appearance of the liver. Gallbladder wall edema measuring 5 mm. Small amount of pericholecystic fluid. Tiny foci of gas within the gallbladder lumen. Common bile duct stent terminates in the second portion of the duodenum. No evidence of biliary ductal dilation or pancreatic duct dilation. Pancreas: Unremarkable. No pancreatic ductal dilatation or surrounding inflammatory changes. Spleen: Normal in size without focal abnormality. Adrenals/Urinary Tract: Adrenal glands are unremarkable. Kidneys are normal, without renal calculi, focal lesion, or hydronephrosis. Bladder is unremarkable. Stomach/Bowel: Stomach is within normal limits. Appendix appears normal. No evidence of bowel wall thickening, distention, or inflammatory changes. Vascular/Lymphatic: No significant vascular findings are present. No enlarged abdominal or pelvic lymph nodes. Reproductive: Prostate is unremarkable. Other: Periduodenal and periportal inflammatory changes. Musculoskeletal: No acute or significant osseous findings. IMPRESSION: Common bile duct stent in appropriate position. Edematous gallbladder wall with small amount of  pericholecystic fluid and inflammatory changes in porta hepatitis and periduodenal locations. Findings are most concerning for acalculus cholecystitis/ cholangitis. Electronically Signed   By: Fidela Salisbury M.D.   On: 03/13/2017 18:00    Assessment and Plan: This is a 41 y.o. male who presents with abdominal pain, nausea, and vomiting.  I have independently viewed the patient's imaging study and reviewed his laboratory studies.  Overall, on CT scan his gallbladder is edematous with mild pericholecystic fluid.  No stones visible CT scan is not best study to see them.  Stent appears in good place, but unclear if it's patent or not.  No significant pancreatic inflammation.  However, his LFTs are worse with total bilirubin more elevated at 11 and his lipase more elevated at 1176.  His WBC is normal but given his symptoms and findings, there is concern for cholangitis, or issues with the stent as well as cholecystitis whether calculous or acalculous.  Discussed with the patient that currently with his issues with LFTs and pancreatitis, and worsening symptoms, would not recommend surgical management of his cholecystitis.  He has been started on IV antibiotics and would see how this helps manage it.  If there is no improvement, then he would need a percutaneous cholecystostomy drain placed.  He would also benefit from GI consult given his worsening labs to evaluate for any potential stent issues or cholangitis.  Would defer to GI for which imaging study would be best.  Would recommend keeping him NPO with IV fluid hydration and IV antibiotics.  Will continue following along with you.  Patient understands this plan and all of his questions have been answered.  Face-to-face time spent with the patient and care providers was 80 minutes, with more than 50% of the time spent counseling, educating, and coordinating care of the patient.     Melvyn Neth, [place]

## 2017-03-13 NOTE — Progress Notes (Signed)
Pharmacy Antibiotic Note  Travis Hawkins is a 41 y.o. male admitted on 03/13/2017 with Intra-abdominal Infection.  Pharmacy has been consulted for Zosyn dosing.  Plan: Start patient on Zosyn 3.375 IV EI every 8 hours.   Height: 5\' 8"  (172.7 cm) Weight: 199 lb (90.3 kg) IBW/kg (Calculated) : 68.4  Temp (24hrs), Avg:98.6 F (37 C), Min:98.6 F (37 C), Max:98.6 F (37 C)  Recent Labs  Lab 03/08/17 2233 03/09/17 0516 03/10/17 0810 03/11/17 0303 03/12/17 0415 03/13/17 1506  WBC 6.5 5.6  --   --   --  7.1  CREATININE 0.65 0.72 0.60* 0.70 0.67 0.77    Estimated Creatinine Clearance: 131.3 mL/min (by C-G formula based on SCr of 0.77 mg/dL).    No Known Allergies  Antimicrobials this admission: 11/14 Zosyn >>   Dose adjustments this admission:  Microbiology results: 11/14  BCx: pending   Thank you for allowing pharmacy to be a part of this patient's care.   Pernell Dupre, PharmD, BCPS Clinical Pharmacist 03/13/2017 7:43 PM

## 2017-03-13 NOTE — ED Provider Notes (Signed)
[facility] Emergency Department Provider Note    First MD Initiated Contact with Patient 03/13/17 1624     (approximate)  I have reviewed the triage vital signs and the nursing notes.   HISTORY  Chief Complaint Abdominal Pain    HPI Travis Hawkins is a 41 y.o. male presents with nausea vomiting itching and generalized malaise.  Patient with recent admission the hospital for concern for biliary obstruction status post ERCP with biliary stenting.  Was discharged yesterday but over the past 24 hours his symptoms have worsened.  Denies any fevers or chills at home.  Is having burning sensation in his abdomen.  Has not been able to eat anything.  Symptoms described as mild to moderate in severity.  ERCP - The major papilla appeared to be bulging. - The common bile duct was moderately dilated, acquired. - Choledocholithiasis was found. Complete removal was accomplished by biliary sphincterotomy and balloon extraction. - A biliary sphincterotomy was performed. - The biliary tree was swept. - Drainage of bile was slow and would not drain. - One plastic stent was placed into the common bile duct.  Past Medical History:  Diagnosis Date  . Biliary obstruction   . Depression   . Mood disorder ([place])    Family History  Problem Relation Age of Onset  . Cancer Maternal Aunt   . Breast cancer Maternal Grandmother   . Lung cancer Maternal Grandfather   . Diabetes Mellitus II Mother    Past Surgical History:  Procedure Laterality Date  . none     Patient Active Problem List   Diagnosis Date Noted  . Acalculous cholecystitis 03/13/2017  . Calculus of bile duct without cholecystitis and without obstruction   . Other specified diseases of biliary tract   . Elevated liver enzymes   . Biliary obstruction 03/09/2017      Prior to Admission medications   Medication Sig Start Date End Date Taking? Authorizing Provider  hydrOXYzine (ATARAX/VISTARIL) 25 MG  tablet Take 1 tablet (25 mg total) 3 (three) times daily as needed by mouth for itching. 03/12/17   Loletha Grayer, MD  lamoTRIgine (LAMICTAL) 25 MG tablet Take 25 mg by mouth at bedtime. Two tabs at bedtime.    [provider]  OLANZapine (ZYPREXA) 10 MG tablet Take 10 mg by mouth at bedtime.    [provider]  ondansetron (ZOFRAN) 4 MG tablet Take 1 tablet (4 mg total) every 6 (six) hours as needed by mouth for nausea. 03/12/17   Loletha Grayer, MD    Allergies Patient has no known allergies.    Social History Social History   Tobacco Use  . Smoking status: Current Every Day Smoker    Packs/day: 1.00    Years: 18.00    Pack years: 18.00    Types: E-cigarettes  . Smokeless tobacco: Never Used  Substance Use Topics  . Alcohol use: Yes    Comment: Occasional wine about every 3-4 months  . Drug use: No    Review of Systems Patient denies headaches, rhinorrhea, blurry vision, numbness, shortness of breath, chest pain, edema, cough, abdominal pain, nausea, vomiting, diarrhea, dysuria, fevers, rashes or hallucinations unless otherwise stated above in HPI. ____________________________________________   PHYSICAL EXAM:  VITAL SIGNS: Vitals:   03/13/17 1503 03/13/17 1700  BP: 126/77 109/74  Pulse: 73 (!) 56  Resp: 18   Temp: 98.6 F (37 C)   SpO2: 99% 98%    Constitutional: Alert and oriented.  in no  acute distress. Eyes: Conjunctivae are normal.  Head: Atraumatic. Nose: No congestion/rhinnorhea. Mouth/Throat: Mucous membranes are moist.   Neck: No stridor. Painless ROM.  Cardiovascular: Normal rate, regular rhythm. Grossly normal heart sounds.  Good peripheral circulation. Respiratory: Normal respiratory effort.  No retractions. Lungs CTAB. Gastrointestinal: Soft and nontender. No distention. No abdominal bruits. No CVA tenderness. Genitourinary:  Musculoskeletal: No lower extremity tenderness nor edema.  No joint effusions. Neurologic:  Normal  speech and language. No gross focal neurologic deficits are appreciated. No facial droop Skin:  Skin is warm, dry and intact. No rash noted. Psychiatric: Mood and affect are normal. Speech and behavior are normal.  ____________________________________________   LABS (all labs ordered are listed, but only abnormal results are displayed)  Results for orders placed or performed during the hospital encounter of 03/13/17 (from the past 24 hour(s))  Lipase, blood     Status: Abnormal   Collection Time: 03/13/17  3:06 PM  Result Value Ref Range   Lipase 1,176 (H) 11 - 51 U/L  Comprehensive metabolic panel     Status: Abnormal   Collection Time: 03/13/17  3:06 PM  Result Value Ref Range   Sodium 135 135 - 145 mmol/L   Potassium 3.6 3.5 - 5.1 mmol/L   Chloride 101 101 - 111 mmol/L   CO2 22 22 - 32 mmol/L   Glucose, Bld 153 (H) 65 - 99 mg/dL   BUN 9 6 - 20 mg/dL   Creatinine, Ser 0.77 0.61 - 1.24 mg/dL   Calcium 9.3 8.9 - 10.3 mg/dL   Total Protein 7.2 6.5 - 8.1 g/dL   Albumin 3.8 3.5 - 5.0 g/dL   AST 66 (H) 15 - 41 U/L   ALT 153 (H) 17 - 63 U/L   Alkaline Phosphatase 304 (H) 38 - 126 U/L   Total Bilirubin 11.0 (H) 0.3 - 1.2 mg/dL   GFR calc non Af Amer >60 >60 mL/min   GFR calc Af Amer >60 >60 mL/min   Anion gap 12 5 - 15  CBC     Status: Abnormal   Collection Time: 03/13/17  3:06 PM  Result Value Ref Range   WBC 7.1 3.8 - 10.6 K/uL   RBC 4.32 (L) 4.40 - 5.90 MIL/uL   Hemoglobin 14.2 13.0 - 18.0 g/dL   HCT 42.0 40.0 - 52.0 %   MCV 97.3 80.0 - 100.0 fL   MCH 32.8 26.0 - 34.0 pg   MCHC 33.7 32.0 - 36.0 g/dL   RDW 15.6 (H) 11.5 - 14.5 %   Platelets 240 150 - 440 K/uL  Urinalysis, Complete w Microscopic     Status: Abnormal   Collection Time: 03/13/17  3:06 PM  Result Value Ref Range   Color, Urine AMBER (A) YELLOW   APPearance CLEAR (A) CLEAR   Specific Gravity, Urine 1.018 1.005 - 1.030   pH 5.0 5.0 - 8.0   Glucose, UA NEGATIVE NEGATIVE mg/dL   Hgb urine dipstick NEGATIVE  NEGATIVE   Bilirubin Urine MODERATE (A) NEGATIVE   Ketones, ur NEGATIVE NEGATIVE mg/dL   Protein, ur 30 (A) NEGATIVE mg/dL   Nitrite NEGATIVE NEGATIVE   Leukocytes, UA NEGATIVE NEGATIVE   RBC / HPF 0-5 0 - 5 RBC/hpf   WBC, UA 0-5 0 - 5 WBC/hpf   Bacteria, UA NONE SEEN NONE SEEN   Squamous Epithelial / LPF 0-5 (A) NONE SEEN   ____________________________________________ _______________________________  RADIOLOGY  I personally reviewed all radiographic images ordered to evaluate for the above  acute complaints and reviewed radiology reports and findings.  These findings were personally discussed with the patient.  Please see medical record for radiology report.  ____________________________________________   PROCEDURES  Procedure(s) performed:  Procedures    Critical Care performed: no ____________________________________________   INITIAL IMPRESSION / ASSESSMENT AND PLAN / ED COURSE  Pertinent labs & imaging results that were available during my care of the patient were reviewed by me and considered in my medical decision making (see chart for details).  DDX: cholangitis, cholelithiasis,. perforation, pancreatitis  Travis Hawkins is a 41 y.o. who presents to the ED with symptoms as described above status post recent complex medical workup here in the hospital and status post ERCP.  Blood work does show evidence of worsening transaminitis with elevated bilirubin and now with worsening lipase concerning for post ERCP pancreatitis versus recurrent obstruction.  Spoke with Dr. Verl Blalock of GI who performed the ERCP.  CT imaging will be ordered to evaluate for post operative complication.  Will provide IV fluids as well as IV medications.  Clinical Course as of Mar 13 1902  Wed Mar 13, 2017  1820 CT imaging reviewed with patient.  I spoke with Dr. Hampton Abbot of general surgery regarding the patient's presentation.  Agrees no indication for emergent surgical intervention at this time.   Given the patient's worsening LFTs, post ERCP elevation of lipase suggesting pancreatitis and worsening of his bilirubin.  Seems to have some component of obstruction given his worsening symptoms will obtain cultures and give single dose of IV Zosyn due to concern for possible cholecystitis.  She is afebrile without white count sepsis seems less likely at this case.  Have discussed with the patient and available family all diagnostics and treatments performed thus far and all questions were answered to the best of my ability. The patient demonstrates understanding and agreement with plan.   [PR]    Clinical Course User Index [PR] Merlyn Lot, MD     ____________________________________________   FINAL CLINICAL IMPRESSION(S) / ED DIAGNOSES  Final diagnoses:  Acute acalculous cholecystitis  Other acute pancreatitis, unspecified complication status  Transaminitis      NEW MEDICATIONS STARTED DURING THIS VISIT:  This SmartLink is deprecated. Use AVSMEDLIST instead to display the medication list for a patient.   Note:  This document was prepared using Dragon voice recognition software and may include unintentional dictation errors.    Merlyn Lot, MD 03/13/17 [phone]

## 2017-03-13 NOTE — Progress Notes (Signed)
Patient complains of itching. Called Dr. Jannifer Franklin and got order for Benadryl. Will continue to monitor patient.

## 2017-03-14 ENCOUNTER — Other Ambulatory Visit: Payer: Self-pay

## 2017-03-14 DIAGNOSIS — K8050 Calculus of bile duct without cholangitis or cholecystitis without obstruction: Secondary | ICD-10-CM | POA: Diagnosis not present

## 2017-03-14 DIAGNOSIS — K810 Acute cholecystitis: Secondary | ICD-10-CM | POA: Diagnosis not present

## 2017-03-14 DIAGNOSIS — K7589 Other specified inflammatory liver diseases: Secondary | ICD-10-CM | POA: Diagnosis not present

## 2017-03-14 DIAGNOSIS — K858 Other acute pancreatitis without necrosis or infection: Secondary | ICD-10-CM | POA: Diagnosis not present

## 2017-03-14 DIAGNOSIS — R740 Nonspecific elevation of levels of transaminase and lactic acid dehydrogenase [LDH]: Secondary | ICD-10-CM | POA: Diagnosis not present

## 2017-03-14 LAB — BASIC METABOLIC PANEL
ANION GAP: 11 (ref 5–15)
BUN: 7 mg/dL (ref 6–20)
CALCIUM: 9.3 mg/dL (ref 8.9–10.3)
CO2: 22 mmol/L (ref 22–32)
CREATININE: 0.91 mg/dL (ref 0.61–1.24)
Chloride: 102 mmol/L (ref 101–111)
GFR calc Af Amer: >60 mL/min (ref >60)
GFR calc non Af Amer: >60 mL/min (ref >60)
GLUCOSE: 81 mg/dL (ref 65–99)
Potassium: 3.9 mmol/L (ref 3.5–5.1)
Sodium: 135 mmol/L (ref 135–145)

## 2017-03-14 LAB — CBC
HCT: 39.2 % — ABNORMAL LOW (ref 40.0–52.0)
HEMOGLOBIN: 13.4 g/dL (ref 13.0–18.0)
MCH: 33 pg (ref 26.0–34.0)
MCHC: 34.2 g/dL (ref 32.0–36.0)
MCV: 96.3 fL (ref 80.0–100.0)
Platelets: 211 10*3/uL (ref 150–440)
RBC: 4.07 MIL/uL — ABNORMAL LOW (ref 4.40–5.90)
RDW: 15.5 % — ABNORMAL HIGH (ref 11.5–14.5)
WBC: 4.7 10*3/uL (ref 3.8–10.6)

## 2017-03-14 LAB — HEPATIC FUNCTION PANEL
ALT: 126 U/L — ABNORMAL HIGH (ref 17–63)
AST: 61 U/L — ABNORMAL HIGH (ref 15–41)
Albumin: 3.6 g/dL (ref 3.5–5.0)
Alkaline Phosphatase: 316 U/L — ABNORMAL HIGH (ref 38–126)
BILIRUBIN DIRECT: 6.8 mg/dL — AB (ref 0.1–0.5)
BILIRUBIN INDIRECT: 4 mg/dL — AB (ref 0.3–0.9)
TOTAL PROTEIN: 6.8 g/dL (ref 6.5–8.1)
Total Bilirubin: 10.8 mg/dL — ABNORMAL HIGH (ref 0.3–1.2)

## 2017-03-14 LAB — LIPASE, BLOOD: Lipase: 500 U/L — ABNORMAL HIGH (ref 11–51)

## 2017-03-14 LAB — FERRITIN: FERRITIN: 556 ng/mL — AB (ref 24–336)

## 2017-03-14 MED ORDER — PROMETHAZINE HCL 25 MG/ML IJ SOLN
6.2500 mg | Freq: Four times a day (QID) | INTRAMUSCULAR | Status: DC | PRN
  Administered 2017-03-14: 6.25 mg via INTRAVENOUS
  Filled 2017-03-14: qty 1

## 2017-03-14 MED ORDER — BOOST / RESOURCE BREEZE PO LIQD
1.0000 | Freq: Three times a day (TID) | ORAL | Status: DC
  Administered 2017-03-14: 1 via ORAL

## 2017-03-14 NOTE — Progress Notes (Signed)
03/14/2017  Subjective: No acute events.  Patient just had itching likely due to his hyperbilirubinemia.  reports that his pain is improved a little bit today.  Vital signs: Temp:  [98.1 F (36.7 C)-98.6 F (37 C)] 98.1 F (36.7 C) (11/15 0431) Pulse Rate:  [56-84] 67 (11/15 0431) Resp:  [16-18] 16 (11/15 0431) BP: (103-126)/(57-78) 103/57 (11/15 0431) SpO2:  [98 %-100 %] 98 % (11/15 0431) Weight:  [88.2 kg (194 lb 6.4 oz)-90.3 kg (199 lb)] 88.2 kg (194 lb 6.4 oz) (11/14 2249)   Intake/Output: 11/14 0701 - 11/15 0700 In: 1035 [I.V.:935; IV Piggyback:100] Out: 500 [Urine:500] Last BM Date: 03/11/17  Physical Exam: Constitutional: No acute distress Abdomen: Soft, nondistended, less tender to palpation in the epigastric and right upper quadrant region as compared to yesterday.  Labs:  Recent Labs    03/13/17 1506 03/14/17 0610  WBC 7.1 4.7  HGB 14.2 13.4  HCT 42.0 39.2*  PLT 240 211   Recent Labs    03/13/17 1506 03/14/17 0610  NA 135 135  K 3.6 3.9  CL 101 102  CO2 22 22  GLUCOSE 153* 81  BUN 9 7  CREATININE 0.77 0.91  CALCIUM 9.3 9.3   Recent Labs    03/13/17 1845  LABPROT 12.8  INR 0.97    Imaging: Ct Abdomen Pelvis W Contrast  Result Date: 03/13/2017 CLINICAL DATA:  Abdominal pain, nausea and vomiting increasing in the last week. Status post ERCP with stent placement on 03/11/2017 and biliary stone removal. EXAM: CT ABDOMEN AND PELVIS WITH CONTRAST TECHNIQUE: Multidetector CT imaging of the abdomen and pelvis was performed using the standard protocol following bolus administration of intravenous contrast. CONTRAST:  131mL ISOVUE-300 IOPAMIDOL (ISOVUE-300) INJECTION 61% COMPARISON:  MRI of the abdomen 03/09/2017 FINDINGS: Lower chest: No acute abnormality. Hepatobiliary: Normal appearance of the liver. Gallbladder wall edema measuring 5 mm. Small amount of pericholecystic fluid. Tiny foci of gas within the gallbladder lumen. Common bile duct stent  terminates in the second portion of the duodenum. No evidence of biliary ductal dilation or pancreatic duct dilation. Pancreas: Unremarkable. No pancreatic ductal dilatation or surrounding inflammatory changes. Spleen: Normal in size without focal abnormality. Adrenals/Urinary Tract: Adrenal glands are unremarkable. Kidneys are normal, without renal calculi, focal lesion, or hydronephrosis. Bladder is unremarkable. Stomach/Bowel: Stomach is within normal limits. Appendix appears normal. No evidence of bowel wall thickening, distention, or inflammatory changes. Vascular/Lymphatic: No significant vascular findings are present. No enlarged abdominal or pelvic lymph nodes. Reproductive: Prostate is unremarkable. Other: Periduodenal and periportal inflammatory changes. Musculoskeletal: No acute or significant osseous findings. IMPRESSION: Common bile duct stent in appropriate position. Edematous gallbladder wall with small amount of pericholecystic fluid and inflammatory changes in porta hepatitis and periduodenal locations. Findings are most concerning for acalculus cholecystitis/ cholangitis. Electronically Signed   By: Fidela Salisbury M.D.   On: 03/13/2017 18:00    Assessment/Plan: 41 year old male with elevated LFTs, pancreatitis, and either a calculus or calculus cholecystitis.  - There is some improvement today in the patient's clinical symptoms.  Would recommend however continuing the patient today on n.p.o. diet with IV fluid hydration.  Continue IV antibiotics for treatment of this cholecystitis and other possibilities including cholangitis given his other symptoms on presentation. -At this point can continue conservative management for his cholecystitis would not require a drain just yet.  We will continue following along with you and if there is no improvement however he may require percutaneous cholecystostomy tube but currently he does not need  one at this time.   Melvyn Neth,  [place]

## 2017-03-14 NOTE — Progress Notes (Signed)
Initial Nutrition Assessment  DOCUMENTATION CODES:   Not applicable  INTERVENTION:   Recommend monitor Mg and Phosphorus once diet advanced.   RD will order vanilla Premier Protein when diet advanced.   NUTRITION DIAGNOSIS:   Inadequate oral intake related to acute illness as evidenced by NPO status.  GOAL:   Patient will meet greater than or equal to 90% of their needs  MONITOR:   Diet advancement, Labs, Weight trends, I & O's  REASON FOR ASSESSMENT:   Malnutrition Screening Tool    ASSESSMENT:   41 year old male with recent ERCP and stent placement for jaundice and biliary obstruction 11/12 admitted with elevated LFTs, pancreatitis, and either a calculus or calculus cholecystitis.   Met with pt in room today. Pt reports decreased appetite and nausea for the past 3-4 weeks. Pt reports that he has been eating at home. Pt was eating 75% of meals at his last discharge on 11/13. Per chart, pt appears weight stable; pt had lost 5lbs since last admission likely r/t dehydration. Pt reports his UBW is 206lbs. Pt currently NPO. Pt would like to have vanilla Premier Protein when diet advanced. RD will monitor for possible need for nutrition support. Recommend monitor Mg and P once diet advanced.   Medications reviewed and include: lovenox, NaCl w/ KCl _0 /hr, zosyn, phenergan   Labs reviewed: AlkPhos 316(H), Lipase 500(H), AST 61(H), ALT 126(H), Bili 6.8(H), indirect bili 4.0(H), tbili 10.8(H)  Nutrition-Focused physical exam completed. Findings are no fat depletion, no muscle depletion, and no edema. Pt with icterus.   Diet Order:  Diet NPO time specified Except for: Sips with Meds  EDUCATION NEEDS:   Education needs have been addressed  Skin:  Reviewed RN Assessment  Last BM:  11/12  Height:   Ht Readings from Last 1 Encounters:  03/13/17 _1  (1.727 m)    Weight:   Wt Readings from Last 1 Encounters:  03/13/17 194 lb 6.4 oz (88.2 kg)    Ideal Body Weight:   70 kg  BMI:  Body mass index is 29.56 kg/m.  Estimated Nutritional Needs:   Kcal:  2000-2300kcal/day   Protein:  88-106g/day   Fluid:  >2L/day   Koleen Distance MS, RD, LDN Pager #[phone] After Hours Pager: [phone]

## 2017-03-14 NOTE — Consult Note (Addendum)
Travis Darby, MD [address]  [place]  [place], [place] [address]  Main: [phone]  Fax: [phone] Pager: [phone]   Consultation  Referring Provider:     No ref. provider found Primary Care Physician:  Patient, No Pcp Per Primary Gastroenterologist:  Dr. Vicente Males        Reason for Consultation:     jaundice  Date of Admission:  03/13/2017 Date of Consultation:  03/14/2017         HPI:   Travis Hawkins is a 41 y.o. male with no significant PMH who was initially admitted on 11/10 18 with RUQ and epigastric pain, fatigue, jaundice, pruritus, dark urine, found to have cholestatic hepatitis, marked extra and intrahepatic bilary ductal dilation and acute cholecystitis. He underwent ERCP with biliary sphincterotomy and CBD stent placement on 03/11/17. He also had mild acute pancreatitis. However, his Bilirubin did not improve after biliary decompression and was discharged on 03/12/17. Pt reported that he continued to feel bad, nausea, RUQ papin, weakness even after procedure and was not ready to go home. He presented to ER yesterday with worsening of symptoms and was admitted. His LFTs are significantly elevated including TBili/DBili and AP. Surgery and GI were consulted. He had repeat CT abdomen yesterday which revealed CBD stent in place and no biliary dilation. He was started on antibiotics.   Apparently, pt said he has been dealing with symptoms for almost a month prior to his initial admission as he was waiting on his insurance. 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 He denies having BM in last 3days. He denies abdominal pain, f/c/ recent abx, NSAID use He is a smoker, denies illicit drug use, ETOH. No new medications His acute hepatitis panel and HIV negative  NSAIDs: none  Antiplts/Anticoagulants/Anti thrombotics: none  GI Procedures: ERCP 03/11/17 - The major papilla appeared to be bulging. -  The common bile duct was moderately dilated, acquired. - Choledocholithiasis was found. Complete removal was accomplished by biliary sphincterotomy and balloon extraction. - A biliary sphincterotomy was performed. - The biliary tree was swept. - Drainage of bile was slow and would not drain. - One plastic stent was placed into the common bile duct.   Past Medical History:  Diagnosis Date  . Biliary obstruction   . Depression   . Mood disorder [facility])     Past Surgical History:  Procedure Laterality Date  . ENDOSCOPIC RETROGRADE CHOLANGIOPANCREATOGRAPHY (ERCP) WITH PROPOFOL N/A 03/11/2017   Procedure: ENDOSCOPIC RETROGRADE CHOLANGIOPANCREATOGRAPHY (ERCP) WITH PROPOFOL;  Surgeon: Lucilla Lame, MD;  Location: [facility] ENDOSCOPY;  Service: Endoscopy;  Laterality: N/A;  . none      Prior to Admission medications   Medication Sig Start Date End Date Taking? Authorizing Provider  lamoTRIgine (LAMICTAL) 25 MG tablet Take 25 mg by mouth at bedtime. Two tabs at bedtime.   Yes [provider]  OLANZapine (ZYPREXA) 10 MG tablet Take 10 mg by mouth at bedtime.   Yes [provider]  hydrOXYzine (ATARAX/VISTARIL) 25 MG tablet Take 1 tablet (25 mg total) 3 (three) times daily as needed by mouth for itching. 03/12/17   Loletha Grayer, MD  ondansetron (ZOFRAN) 4 MG tablet Take 1 tablet (4 mg total) every 6 (six) hours as needed by mouth for nausea. 03/12/17   Loletha Grayer, MD    Family History  Problem Relation Age of Onset  . Cancer Maternal Aunt   .  Breast cancer Maternal Grandmother   . Lung cancer Maternal Grandfather   . Diabetes Mellitus II Mother      Social History   Tobacco Use  . Smoking status: Current Every Day Smoker    Packs/day: 1.00    Years: 18.00    Pack years: 18.00    Types: E-cigarettes  . Smokeless tobacco: Never Used  Substance Use Topics  . Alcohol use: Yes    Comment: Occasional wine about every 3-4 months  . Drug use: No    Allergies as  of 03/13/2017  . (No Known Allergies)    Review of Systems:    All systems reviewed and negative except where noted in HPI.   Physical Exam:  Vital signs in last 24 hours: Temp:  [98.1 F (36.7 C)-98.6 F (37 C)] 98.1 F (36.7 C) (11/15 0431) Pulse Rate:  [56-84] 67 (11/15 0431) Resp:  [16-18] 16 (11/15 0431) BP: (103-126)/(57-78) 103/57 (11/15 0431) SpO2:  [98 %-100 %] 98 % (11/15 0431) Weight:  [194 lb 6.4 oz (88.2 kg)-199 lb (90.3 kg)] 194 lb 6.4 oz (88.2 kg) (11/14 2249) Last BM Date: 03/11/17 General:   Pleasant, cooperative in NAD Head:  Normocephalic and atraumatic. Eyes:   Deep icterus.   Conjunctiva pink. PERRLA. Ears:  Normal auditory acuity. Neck:  Supple; no masses or thyroidomegaly Lungs: Respirations even and unlabored. Lungs clear to auscultation bilaterally.   No wheezes, crackles, or rhonchi.  Heart:  Regular rate and rhythm;  Without murmur, clicks, rubs or gallops Abdomen:  Soft, nondistended, moderate RUQ tenderness, mild epigastric tenderness. Normal bowel sounds. No appreciable masses or hepatomegaly.  No rebound or guarding.  Rectal:  Not performed. Msk:  Symmetrical without gross deformities.  Strength normal Extremities:  Without edema, cyanosis or clubbing. Neurologic:  Alert and oriented x3;  grossly normal neurologically. Skin:  Intact without significant lesions or rashes. Cervical Nodes:  No significant cervical adenopathy. Psych:  Alert and cooperative. Normal affect.  LAB RESULTS: CBC Latest Ref Rng & Units 03/14/2017 03/13/2017 03/09/2017  WBC 3.8 - 10.6 K/uL 4.7 7.1 5.6  Hemoglobin 13.0 - 18.0 g/dL 13.4 14.2 13.8  Hematocrit 40.0 - 52.0 % 39.2(L) 42.0 40.5  Platelets 150 - 440 K/uL 211 240 212    BMET BMP Latest Ref Rng & Units 03/14/2017 03/13/2017 03/12/2017  Glucose 65 - 99 mg/dL 81 153(H) 112(H)  BUN 6 - 20 mg/dL '7 9 7  ' Creatinine 0.61 - 1.24 mg/dL 0.91 0.77 0.67  Sodium 135 - 145 mmol/L 135 135 137  Potassium 3.5 - 5.1 mmol/L 3.9  3.6 3.9  Chloride 101 - 111 mmol/L 102 101 105  CO2 22 - 32 mmol/L '22 22 25  ' Calcium 8.9 - 10.3 mg/dL 9.3 9.3 8.8(L)    LFT Hepatic Function Latest Ref Rng & Units 03/14/2017 03/13/2017 03/12/2017  Total Protein 6.5 - 8.1 g/dL 6.8 7.2 6.9  Albumin 3.5 - 5.0 g/dL 3.6 3.8 3.4(L)  AST 15 - 41 U/L 61(H) 66(H) 72(H)  ALT 17 - 63 U/L 126(H) 153(H) 182(H)  Alk Phosphatase 38 - 126 U/L 316(H) 304(H) 203(H)  Total Bilirubin 0.3 - 1.2 mg/dL 10.8(H) 11.0(H) 9.5(H)  Bilirubin, Direct 0.1 - 0.5 mg/dL 6.8(H) - -     STUDIES: Ct Abdomen Pelvis W Contrast  Result Date: 03/13/2017 CLINICAL DATA:  Abdominal pain, nausea and vomiting increasing in the last week. Status post ERCP with stent placement on 03/11/2017 and biliary stone removal. EXAM: CT ABDOMEN AND PELVIS WITH CONTRAST TECHNIQUE: Multidetector  CT imaging of the abdomen and pelvis was performed using the standard protocol following bolus administration of intravenous contrast. CONTRAST:  178m ISOVUE-300 IOPAMIDOL (ISOVUE-300) INJECTION 61% COMPARISON:  MRI of the abdomen 03/09/2017 FINDINGS: Lower chest: No acute abnormality. Hepatobiliary: Normal appearance of the liver. Gallbladder wall edema measuring 5 mm. Small amount of pericholecystic fluid. Tiny foci of gas within the gallbladder lumen. Common bile duct stent terminates in the second portion of the duodenum. No evidence of biliary ductal dilation or pancreatic duct dilation. Pancreas: Unremarkable. No pancreatic ductal dilatation or surrounding inflammatory changes. Spleen: Normal in size without focal abnormality. Adrenals/Urinary Tract: Adrenal glands are unremarkable. Kidneys are normal, without renal calculi, focal lesion, or hydronephrosis. Bladder is unremarkable. Stomach/Bowel: Stomach is within normal limits. Appendix appears normal. No evidence of bowel wall thickening, distention, or inflammatory changes. Vascular/Lymphatic: No significant vascular findings are present. No enlarged  abdominal or pelvic lymph nodes. Reproductive: Prostate is unremarkable. Other: Periduodenal and periportal inflammatory changes. Musculoskeletal: No acute or significant osseous findings. IMPRESSION: Common bile duct stent in appropriate position. Edematous gallbladder wall with small amount of pericholecystic fluid and inflammatory changes in porta hepatitis and periduodenal locations. Findings are most concerning for acalculus cholecystitis/ cholangitis. Electronically Signed   By: DFidela SalisburyM.D.   On: 03/13/2017 18:00      Impression / Plan:   Travis RIDLEYis a 41y.o. 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 Pt readmitted with persistently elevated LFTs, RUQ and epigastric pain  Cholestatic hepatitis: No evidence of acute liver failure - Most likely secondary to choledocholithiasis - Other DDs include ampullary edema after ERCP resulting in persistently elevated LFTs - PSC is another possibility, however MRCP was not suggestive of beaded appearance of bile ducts - He does not have cholangitis - Monitor LFTs closely - Blood cx negative - Will reassess need for CBD stent evaluation if he continues to have persistently elevated LFTs - Work up for other causes of cholestatic hepatitis such as PBC or AIH or IgG4 related cholangitis. Check AMA, ANA, ASMA, IgG4 levels, recommend liver biopsy if LFTs not improving - Ok for CLD  Thank you for involving me in the care of this patient.      LOS: 1 day   RSherri Sear MD  03/14/2017, 10:20 AM   Note: This dictation was prepared with Dragon dictation along with smaller phrase technology. Any transcriptional errors that result from this process are unintentional.

## 2017-03-14 NOTE — Progress Notes (Addendum)
[place] [facility] [place] NAME: Travis Hawkins    MR#:  [id number]  DATE OF BIRTH:  1975-11-22  SUBJECTIVE:  CHIEF COMPLAINT:   Chief Complaint  Patient presents with  . Abdominal Pain   Better abdominal pain without radiation. REVIEW OF SYSTEMS:  Review of Systems  Constitutional: Positive for malaise/fatigue. Negative for chills and fever.  HENT: Negative for sore throat.   Eyes: Negative for blurred vision and double vision.  Respiratory: Negative for cough, hemoptysis, shortness of breath, wheezing and stridor.   Cardiovascular: Negative for chest pain, palpitations, orthopnea and leg swelling.  Gastrointestinal: Positive for abdominal pain and nausea. Negative for blood in stool, diarrhea, melena and vomiting.  Genitourinary: Negative for dysuria, flank pain and hematuria.  Musculoskeletal: Negative for back pain and joint pain.  Skin: Negative for rash.  Neurological: Negative for dizziness, sensory change, focal weakness, seizures, loss of consciousness, weakness and headaches.  Endo/Heme/Allergies: Negative for polydipsia.  Psychiatric/Behavioral: Negative for depression. The patient is not nervous/anxious.     DRUG ALLERGIES:  No Known Allergies VITALS:  Blood pressure 110/62, pulse 68, temperature 98.2 F (36.8 C), temperature source Oral, resp. rate 18, height 5\' 8"  (1.727 m), weight 194 lb 6.4 oz (88.2 kg), SpO2 97 %. PHYSICAL EXAMINATION:  Physical Exam  Constitutional: He is oriented to person, place, and time and well-developed, well-nourished, and in no distress.  HENT:  Head: Normocephalic.  Mouth/Throat: Oropharynx is clear and moist.  Eyes: Conjunctivae and EOM are normal. Pupils are equal, round, and reactive to light. Scleral icterus is present.  Neck: Normal range of motion. Neck supple. No JVD present. No tracheal deviation present.  Cardiovascular: Normal rate, regular rhythm and normal heart sounds. Exam reveals  no gallop.  No murmur heard. Pulmonary/Chest: Effort normal and breath sounds normal. No respiratory distress. He has no wheezes. He has no rales.  Abdominal: Soft. Bowel sounds are normal. He exhibits no distension. There is tenderness. There is no rebound.  Musculoskeletal: Normal range of motion. He exhibits no edema or tenderness.  Neurological: He is alert and oriented to person, place, and time. No cranial nerve deficit.  Skin: No rash noted. No erythema.  Jaundice  Psychiatric: Affect normal.   LABORATORY PANEL:  Male CBC Recent Labs  Lab 03/14/17 0610  WBC 4.7  HGB 13.4  HCT 39.2*  PLT 211   ------------------------------------------------------------------------------------------------------------------ Chemistries  Recent Labs  Lab 03/14/17 0610  NA 135  K 3.9  CL 102  CO2 22  GLUCOSE 81  BUN 7  CREATININE 0.91  CALCIUM 9.3  AST 61*  ALT 126*  ALKPHOS 316*  BILITOT 10.8*   RADIOLOGY:  Ct Abdomen Pelvis W Contrast  Result Date: 03/13/2017 CLINICAL DATA:  Abdominal pain, nausea and vomiting increasing in the last week. Status post ERCP with stent placement on 03/11/2017 and biliary stone removal. EXAM: CT ABDOMEN AND PELVIS WITH CONTRAST TECHNIQUE: Multidetector CT imaging of the abdomen and pelvis was performed using the standard protocol following bolus administration of intravenous contrast. CONTRAST:  155mL ISOVUE-300 IOPAMIDOL (ISOVUE-300) INJECTION 61% COMPARISON:  MRI of the abdomen 03/09/2017 FINDINGS: Lower chest: No acute abnormality. Hepatobiliary: Normal appearance of the liver. Gallbladder wall edema measuring 5 mm. Small amount of pericholecystic fluid. Tiny foci of gas within the gallbladder lumen. Common bile duct stent terminates in the second portion of the duodenum. No evidence of biliary ductal dilation or pancreatic duct dilation. Pancreas: Unremarkable. No pancreatic ductal dilatation or surrounding  inflammatory changes. Spleen: Normal in size  without focal abnormality. Adrenals/Urinary Tract: Adrenal glands are unremarkable. Kidneys are normal, without renal calculi, focal lesion, or hydronephrosis. Bladder is unremarkable. Stomach/Bowel: Stomach is within normal limits. Appendix appears normal. No evidence of bowel wall thickening, distention, or inflammatory changes. Vascular/Lymphatic: No significant vascular findings are present. No enlarged abdominal or pelvic lymph nodes. Reproductive: Prostate is unremarkable. Other: Periduodenal and periportal inflammatory changes. Musculoskeletal: No acute or significant osseous findings. IMPRESSION: Common bile duct stent in appropriate position. Edematous gallbladder wall with small amount of pericholecystic fluid and inflammatory changes in porta hepatitis and periduodenal locations. Findings are most concerning for acalculus cholecystitis/ cholangitis. Electronically Signed   By: Fidela Salisbury M.D.   On: 03/13/2017 18:00   ASSESSMENT AND PLAN:    1.  Acalculous cholecystitis, jaundice with biliary obstruction status post ERCP and stent, elevated liver function test.    Continue IV Zosyn.  N.p.o. with IV fluid support, pain control.  No indication for surgery at this time and medical treatment per surgeon. 2.  Acute pancreatitis secondary to biliary obstruction.  IV fluid hydration and n.p.o. Lipase level decreased to 500. 3.  Hypokalemia, improved with the supplement. 4.  Mood disorder on lamotrigine and olanzapine 5.  Impaired fasting glucose.  The patient is not a diabetic. 6.  Itching from hyperbilirubinemia.  As needed Atarax.  Discussed with Dr. Marius Ditch. All the records are reviewed and case discussed with Care Management/Social Worker. Management plans discussed with the patient, family and they are in agreement.  CODE STATUS: Full Code  TOTAL TIME TAKING CARE OF THIS PATIENT: 37 minutes.   More than 50% of the time was spent in counseling/coordination of care:  YES  POSSIBLE D/C IN 2-3 DAYS, DEPENDING ON CLINICAL CONDITION.   Demetrios Loll M.D on 03/14/2017 at 3:19 PM  Between 7am to 6pm - Pager - [phone]  After 6pm go to [email or web address] - [facility]

## 2017-03-15 DIAGNOSIS — K7589 Other specified inflammatory liver diseases: Secondary | ICD-10-CM | POA: Diagnosis not present

## 2017-03-15 LAB — COMPREHENSIVE METABOLIC PANEL WITH GFR
ALT: 114 U/L — ABNORMAL HIGH (ref 17–63)
AST: 69 U/L — ABNORMAL HIGH (ref 15–41)
Albumin: 3.4 g/dL — ABNORMAL LOW (ref 3.5–5.0)
Alkaline Phosphatase: 346 U/L — ABNORMAL HIGH (ref 38–126)
Anion gap: 9 (ref 5–15)
BUN: 7 mg/dL (ref 6–20)
CO2: 25 mmol/L (ref 22–32)
Calcium: 8.9 mg/dL (ref 8.9–10.3)
Chloride: 102 mmol/L (ref 101–111)
Creatinine, Ser: 0.79 mg/dL (ref 0.61–1.24)
GFR calc Af Amer: >60 mL/min
GFR calc non Af Amer: >60 mL/min
Glucose, Bld: 109 mg/dL — ABNORMAL HIGH (ref 65–99)
Potassium: 4.1 mmol/L (ref 3.5–5.1)
Sodium: 136 mmol/L (ref 135–145)
Total Bilirubin: 9.2 mg/dL — ABNORMAL HIGH (ref 0.3–1.2)
Total Protein: 6.4 g/dL — ABNORMAL LOW (ref 6.5–8.1)

## 2017-03-15 LAB — LIPID PANEL
CHOLESTEROL: 229 mg/dL — AB (ref 0–200)
LDL Cholesterol: UNDETERMINED mg/dL (ref 0–99)
TRIGLYCERIDES: 426 mg/dL — AB (ref <150)
VLDL: UNDETERMINED mg/dL (ref 0–40)

## 2017-03-15 LAB — COMPREHENSIVE METABOLIC PANEL
ALT: 115 U/L — ABNORMAL HIGH (ref 17–63)
AST: 68 U/L — AB (ref 15–41)
Albumin: 3.6 g/dL (ref 3.5–5.0)
Alkaline Phosphatase: 358 U/L — ABNORMAL HIGH (ref 38–126)
Anion gap: 8 (ref 5–15)
BILIRUBIN TOTAL: 9.3 mg/dL — AB (ref 0.3–1.2)
BUN: 9 mg/dL (ref 6–20)
CHLORIDE: 103 mmol/L (ref 101–111)
CO2: 24 mmol/L (ref 22–32)
CREATININE: 0.76 mg/dL (ref 0.61–1.24)
Calcium: 9.3 mg/dL (ref 8.9–10.3)
GFR calc Af Amer: >60 mL/min (ref >60)
Glucose, Bld: 122 mg/dL — ABNORMAL HIGH (ref 65–99)
Potassium: 4.3 mmol/L (ref 3.5–5.1)
Sodium: 135 mmol/L (ref 135–145)
Total Protein: 7.3 g/dL (ref 6.5–8.1)

## 2017-03-15 LAB — MITOCHONDRIAL ANTIBODIES: Mitochondrial M2 Ab, IgG: 5.8 U (ref 0.0–20.0)

## 2017-03-15 LAB — LIPASE, BLOOD: LIPASE: 288 U/L — AB (ref 11–51)

## 2017-03-15 LAB — IGG 4: IgG, Subclass 4: 18 mg/dL (ref 2–96)

## 2017-03-15 LAB — ANA W/REFLEX IF POSITIVE: Anti Nuclear Antibody(ANA): NEGATIVE

## 2017-03-15 LAB — ANTI-SMOOTH MUSCLE ANTIBODY, IGG: F-Actin IgG: 15 Units (ref 0–19)

## 2017-03-15 MED ORDER — PREMIER PROTEIN SHAKE
11.0000 [oz_av] | Freq: Two times a day (BID) | ORAL | Status: DC
  Administered 2017-03-15 – 2017-03-20 (×8): 11 [oz_av] via ORAL

## 2017-03-15 MED ORDER — LAMOTRIGINE 25 MG PO TABS
50.0000 mg | ORAL_TABLET | Freq: Every day | ORAL | Status: DC
  Administered 2017-03-15 – 2017-03-19 (×5): 50 mg via ORAL
  Filled 2017-03-15 (×5): qty 2

## 2017-03-15 NOTE — Progress Notes (Signed)
03/15/2017  Subjective: No acute events.  Reports this morning is feeling better, but has some nausea.  Pain improved.  Vital signs: Temp:  [97.9 F (36.6 C)-98.5 F (36.9 C)] 98.1 F (36.7 C) (11/16 1148) Pulse Rate:  [64-70] 64 (11/16 1148) Resp:  [16] 16 (11/16 0429) BP: (91-104)/(56-63) 104/63 (11/16 1148) SpO2:  [98 %-100 %] 99 % (11/16 1148)   Intake/Output: 11/15 0701 - 11/16 0700 In: 2140 [P.O.:120; I.V.:1915; IV Piggyback:105] Out: 500 [Urine:500] Last BM Date: 03/12/17  Physical Exam: Constitutional: No acute distress Abdomen:  Soft, nondistended, mild tenderness to palpation over right upper quadrant, improved compared to yesterday.  Labs:  Recent Labs    03/13/17 1506 03/14/17 0610  WBC 7.1 4.7  HGB 14.2 13.4  HCT 42.0 39.2*  PLT 240 211   Recent Labs    03/14/17 0610 03/15/17 0308  NA 135 136  K 3.9 4.1  CL 102 102  CO2 22 25  GLUCOSE 81 109*  BUN 7 7  CREATININE 0.91 0.79  CALCIUM 9.3 8.9   Recent Labs    03/13/17 1845  LABPROT 12.8  INR 0.97    Imaging: No results found.  Assessment/Plan: 41 yo male with cholecystitis, in setting of elevated LFTs, s/p ERCP and stent placement.  --From gallbladder standpoint, the patient is improving.  No acute surgical needs at this point and he does not require a drain at this point.   --Regarding his elevated LFTs, would defer to GI for management. --If patient up to it, and ok with GI team, may advance diet as tolerated today.  Continue IV antibiotics for his cholecystitis.  He would require a total 10-14 day course.   Melvyn Neth, [place]

## 2017-03-15 NOTE — Progress Notes (Signed)
Cephas Darby, MD [address]  [place]  [place], [place] [address]  Main: [phone]  Fax: [phone] Pager: [place] is being followed for elevated LFTs Day 2 of follow up    Subjective: No acute events.  Reports this morning is feeling better, but has some nausea.  Pain improved.  Objective: Vital signs in last 24 hours: Vitals:   03/14/17 1135 03/14/17 2018 03/15/17 0429 03/15/17 1148  BP: 110/62 (!) 99/57 (!) 91/56 104/63  Pulse: 68 70 66 64  Resp: 18 16 16    Temp: 98.2 F (36.8 C) 97.9 F (36.6 C) 98.5 F (36.9 C) 98.1 F (36.7 C)  TempSrc: Oral  Oral Oral  SpO2: 97% 98% 100% 99%  Weight:      Height:       Weight change:   Intake/Output Summary (Last 24 hours) at 03/15/2017 1547 Last data filed at 03/15/2017 1240 Gross per 24 hour  Intake 3421 ml  Output 950 ml  Net 2471 ml     Exam: Heart:: Regular rate and rhythm, S1S2 present or without murmur or extra heart sounds Lungs: clear to auscultation Abdomen: soft, mild RUQ tenderness, normal bowel sounds   Lab Results: CMP Latest Ref Rng & Units 03/15/2017 03/14/2017 03/13/2017  Glucose 65 - 99 mg/dL 109(H) 81 153(H)  BUN 6 - 20 mg/dL 7 7 9   Creatinine 0.61 - 1.24 mg/dL 0.79 0.91 0.77  Sodium 135 - 145 mmol/L 136 135 135  Potassium 3.5 - 5.1 mmol/L 4.1 3.9 3.6  Chloride 101 - 111 mmol/L 102 102 101  CO2 22 - 32 mmol/L 25 22 22   Calcium 8.9 - 10.3 mg/dL 8.9 9.3 9.3  Total Protein 6.5 - 8.1 g/dL 6.4(L) 6.8 7.2  Total Bilirubin 0.3 - 1.2 mg/dL 9.2(H) 10.8(H) 11.0(H)  Alkaline Phos 38 - 126 U/L 346(H) 316(H) 304(H)  AST 15 - 41 U/L 69(H) 61(H) 66(H)  ALT 17 - 63 U/L 114(H) 126(H) 153(H)   CBC Latest Ref Rng & Units 03/14/2017 03/13/2017 03/09/2017  WBC 3.8 - 10.6 K/uL 4.7 7.1 5.6  Hemoglobin 13.0 - 18.0 g/dL 13.4 14.2 13.8  Hematocrit 40.0 - 52.0 % 39.2(L) 42.0 40.5  Platelets 150 - 440 K/uL 211 240 212   Micro Results: Recent Results (from the past 240 hour(s))    Blood culture (routine x 2)     Status: None (Preliminary result)   Collection Time: 03/13/17  6:45 PM  Result Value Ref Range Status   Specimen Description BLOOD LEFT AC  Final   Special Requests   Final    BOTTLES DRAWN AEROBIC AND ANAEROBIC Blood Culture results may not be optimal due to an excessive volume of blood received in culture bottles   Culture NO GROWTH 2 DAYS  Final   Report Status PENDING  Incomplete  Blood culture (routine x 2)     Status: None (Preliminary result)   Collection Time: 03/13/17  6:45 PM  Result Value Ref Range Status   Specimen Description BLOOD LEFT FA  Final   Special Requests   Final    BOTTLES DRAWN AEROBIC AND ANAEROBIC Blood Culture adequate volume   Culture NO GROWTH 2 DAYS  Final   Report Status PENDING  Incomplete   Studies/Results: Ct Abdomen Pelvis W Contrast  Result Date: 03/13/2017 CLINICAL DATA:  Abdominal pain, nausea and vomiting increasing in the last week. Status post ERCP with stent placement on 03/11/2017 and biliary stone removal. EXAM: CT ABDOMEN AND  PELVIS WITH CONTRAST TECHNIQUE: Multidetector CT imaging of the abdomen and pelvis was performed using the standard protocol following bolus administration of intravenous contrast. CONTRAST:  159mL ISOVUE-300 IOPAMIDOL (ISOVUE-300) INJECTION 61% COMPARISON:  MRI of the abdomen 03/09/2017 FINDINGS: Lower chest: No acute abnormality. Hepatobiliary: Normal appearance of the liver. Gallbladder wall edema measuring 5 mm. Small amount of pericholecystic fluid. Tiny foci of gas within the gallbladder lumen. Common bile duct stent terminates in the second portion of the duodenum. No evidence of biliary ductal dilation or pancreatic duct dilation. Pancreas: Unremarkable. No pancreatic ductal dilatation or surrounding inflammatory changes. Spleen: Normal in size without focal abnormality. Adrenals/Urinary Tract: Adrenal glands are unremarkable. Kidneys are normal, without renal calculi, focal lesion, or  hydronephrosis. Bladder is unremarkable. Stomach/Bowel: Stomach is within normal limits. Appendix appears normal. No evidence of bowel wall thickening, distention, or inflammatory changes. Vascular/Lymphatic: No significant vascular findings are present. No enlarged abdominal or pelvic lymph nodes. Reproductive: Prostate is unremarkable. Other: Periduodenal and periportal inflammatory changes. Musculoskeletal: No acute or significant osseous findings. IMPRESSION: Common bile duct stent in appropriate position. Edematous gallbladder wall with small amount of pericholecystic fluid and inflammatory changes in porta hepatitis and periduodenal locations. Findings are most concerning for acalculus cholecystitis/ cholangitis. Electronically Signed   By: Fidela Salisbury M.D.   On: 03/13/2017 18:00   Medications: I have reviewed the patient's current medications. Scheduled Meds: . enoxaparin (LOVENOX) injection  40 mg Subcutaneous Q24H  . lamoTRIgine  50 mg Oral QHS  . OLANZapine  10 mg Oral QHS  . protein supplement shake  11 oz Oral BID BM   Continuous Infusions: . 0.9 % NaCl with KCl 20 mEq / L 100 mL/hr at 03/15/17 1124  . piperacillin-tazobactam (ZOSYN)  IV Stopped (03/15/17 1239)   PRN Meds:.acetaminophen **OR** acetaminophen, fentaNYL (SUBLIMAZE) injection, hydrOXYzine, ondansetron (ZOFRAN) IV, oxyCODONE, promethazine   Assessment: Active Problems:   Acute acalculous cholecystitis  ARAVIND CHRISMER is a 41 y.o. 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 Pt readmitted with persistently elevated LFTs, RUQ and epigastric pain   Plan: Cholestatic hepatitis: No evidence of acute liver failure - LFTs slowly improving - Most likely secondary to choledocholithiasis -  Other DDs include ampullary edema after ERCP resulting in persistently elevated LFTs - PSC is another possibility, however MRCP was not suggestive of beaded appearance of bile ducts - He does not have cholangitis - Monitor LFTs closely - Blood cx negative, on empiric abx - Will reassess need for CBD stent evaluation if he continues to have persistently elevated LFTs - Work up for other causes of cholestatic hepatitis such as PBC or AIH or IgG4 related cholangitis is negative. Recommend liver biopsy if LFTs not improving - Ok for full liquids - If LFTs improve in next 2 days, ok to discharge with close f/u with Dr Allen Norris as out pt  Thank you for involving me in the care of this patient.        LOS: 2 days   Caylen Yardley 03/15/2017, 3:47 PM

## 2017-03-15 NOTE — Care Management (Signed)
Patient recently discharge from [facility].  Self pay.  On 11/13 patient was provided with  aapplication to [facility], Medication Management , and "The Network:  Your Guide to [employer] and [employer] in [facility]"  Booklet.  Coupons provided from goodrx.com at that time.  RNCM following

## 2017-03-15 NOTE — Progress Notes (Signed)
[facility] at [place] NAME: Elia Keenum    MR#:  [id number]  DATE OF BIRTH:  1976-04-08  SUBJECTIVE:  CHIEF COMPLAINT:   Chief Complaint  Patient presents with  . Abdominal Pain   Better abdominal pain and nausea. REVIEW OF SYSTEMS:  Review of Systems  Constitutional: Negative for chills, fever and malaise/fatigue.  HENT: Negative for sore throat.   Eyes: Negative for blurred vision and double vision.  Respiratory: Negative for cough, hemoptysis, shortness of breath, wheezing and stridor.   Cardiovascular: Negative for chest pain, palpitations, orthopnea and leg swelling.  Gastrointestinal: Positive for abdominal pain and nausea. Negative for blood in stool, diarrhea, melena and vomiting.  Genitourinary: Negative for dysuria, flank pain and hematuria.  Musculoskeletal: Negative for back pain and joint pain.  Skin: Negative for rash.  Neurological: Negative for dizziness, sensory change, focal weakness, seizures, loss of consciousness, weakness and headaches.  Endo/Heme/Allergies: Negative for polydipsia.  Psychiatric/Behavioral: Negative for depression. The patient is not nervous/anxious.     DRUG ALLERGIES:  No Known Allergies VITALS:  Blood pressure 104/63, pulse 64, temperature 98.1 F (36.7 C), temperature source Oral, resp. rate 16, height 5\' 8"  (1.727 m), weight 194 lb 6.4 oz (88.2 kg), SpO2 99 %. PHYSICAL EXAMINATION:  Physical Exam  Constitutional: He is oriented to person, place, and time and well-developed, well-nourished, and in no distress.  HENT:  Head: Normocephalic.  Mouth/Throat: Oropharynx is clear and moist.  Eyes: Conjunctivae and EOM are normal. Pupils are equal, round, and reactive to light.  Neck: Normal range of motion. Neck supple. No JVD present. No tracheal deviation present.  Cardiovascular: Normal rate, regular rhythm and normal heart sounds. Exam reveals no gallop.  No murmur heard. Pulmonary/Chest:  Effort normal and breath sounds normal. No respiratory distress. He has no wheezes. He has no rales.  Abdominal: Soft. Bowel sounds are normal. He exhibits no distension. There is tenderness. There is no rebound.  Musculoskeletal: Normal range of motion. He exhibits no edema or tenderness.  Neurological: He is alert and oriented to person, place, and time. No cranial nerve deficit.  Skin: No rash noted. No erythema.  Jaundice  Psychiatric: Affect normal.   LABORATORY PANEL:  Male CBC Recent Labs  Lab 03/14/17 0610  WBC 4.7  HGB 13.4  HCT 39.2*  PLT 211   ------------------------------------------------------------------------------------------------------------------ Chemistries  Recent Labs  Lab 03/15/17 0308  NA 136  K 4.1  CL 102  CO2 25  GLUCOSE 109*  BUN 7  CREATININE 0.79  CALCIUM 8.9  AST 69*  ALT 114*  ALKPHOS 346*  BILITOT 9.2*   RADIOLOGY:  No results found. ASSESSMENT AND PLAN:    1.  Acalculous cholecystitis, jaundice with biliary obstruction status post ERCP and stent, elevated liver function test.    Continue IV Zosyn.  IV fluid support, pain control. No acute surgical needs at this point and he does not require a drain at this point per Dr. Hampton Abbot. LFTs slowly improving, If LFTs improve in next 2 days, ok to discharge with close f/u with Dr Allen Norris as out pt per Dr. Marius Ditch. Advanced diet to full liquid diet then low-fat diet if tolerated.  2.  Acute pancreatitis secondary to biliary obstruction.  Improved.  3.  Hypokalemia, improved with the supplement. 4.  Mood disorder on lamotrigine and olanzapine 5.  Impaired fasting glucose.  The patient is not a diabetic. 6.  Itching from hyperbilirubinemia.  As needed Atarax.  Discussed  with Dr. Marius Ditch. All the records are reviewed and case discussed with Care Management/Social Worker. Management plans discussed with the patient, family and they are in agreement.  CODE STATUS: Full Code  TOTAL TIME TAKING  CARE OF THIS PATIENT: 32 minutes.   More than 50% of the time was spent in counseling/coordination of care: YES  POSSIBLE D/C IN 2 DAYS, DEPENDING ON CLINICAL CONDITION.   Demetrios Loll M.D on 03/15/2017 at 4:22 PM  Between 7am to 6pm - Pager - [phone]  After 6pm go to [email or web address] - [facility]

## 2017-03-15 NOTE — Progress Notes (Signed)
Pharmacy Antibiotic Note  Travis Hawkins is a 41 y.o. male admitted on 03/13/2017 with Intra-abdominal Infection.  Pharmacy has been consulted for Zosyn dosing.  Plan: Continue Zosyn 3.375 IV EI every 8 hours.   Height: 5\' 8"  (172.7 cm) Weight: 194 lb 6.4 oz (88.2 kg) IBW/kg (Calculated) : 68.4  Temp (24hrs), Avg:98.2 F (36.8 C), Min:97.9 F (36.6 C), Max:98.5 F (36.9 C)  Recent Labs  Lab 03/08/17 2233 03/09/17 0516  03/11/17 0303 03/12/17 0415 03/13/17 1506 03/14/17 0610 03/15/17 0308  WBC 6.5 5.6  --   --   --  7.1 4.7  --   CREATININE 0.65 0.72   < > 0.70 0.67 0.77 0.91 0.79   < > = values in this interval not displayed.    Estimated Creatinine Clearance: 129.8 mL/min (by C-G formula based on SCr of 0.79 mg/dL).    No Known Allergies  Antimicrobials this admission: 11/14 Zosyn >>   Dose adjustments this admission:  Microbiology results: 11/14  BCx NGTD  Thank you for allowing pharmacy to be a part of this patient's care.   Rocky Morel, PharmD, BCPS Clinical Pharmacist 03/15/2017 10:58 AM

## 2017-03-16 DIAGNOSIS — R740 Nonspecific elevation of levels of transaminase and lactic acid dehydrogenase [LDH]: Secondary | ICD-10-CM | POA: Diagnosis not present

## 2017-03-16 DIAGNOSIS — K810 Acute cholecystitis: Secondary | ICD-10-CM | POA: Diagnosis not present

## 2017-03-16 LAB — COMPREHENSIVE METABOLIC PANEL
ALBUMIN: 3.2 g/dL — AB (ref 3.5–5.0)
ALT: 98 U/L — AB (ref 17–63)
ANION GAP: 9 (ref 5–15)
AST: 55 U/L — ABNORMAL HIGH (ref 15–41)
Alkaline Phosphatase: 298 U/L — ABNORMAL HIGH (ref 38–126)
BUN: 9 mg/dL (ref 6–20)
CHLORIDE: 104 mmol/L (ref 101–111)
CO2: 22 mmol/L (ref 22–32)
Calcium: 9.3 mg/dL (ref 8.9–10.3)
Creatinine, Ser: 1.01 mg/dL (ref 0.61–1.24)
GFR calc non Af Amer: >60 mL/min (ref >60)
GLUCOSE: 92 mg/dL (ref 65–99)
Potassium: 4.2 mmol/L (ref 3.5–5.1)
SODIUM: 135 mmol/L (ref 135–145)
Total Bilirubin: 7.8 mg/dL — ABNORMAL HIGH (ref 0.3–1.2)
Total Protein: 6.7 g/dL (ref 6.5–8.1)

## 2017-03-16 LAB — PHOSPHORUS: PHOSPHORUS: 3.9 mg/dL (ref 2.5–4.6)

## 2017-03-16 LAB — MAGNESIUM: Magnesium: 2 mg/dL (ref 1.7–2.4)

## 2017-03-16 MED ORDER — AMOXICILLIN-POT CLAVULANATE 875-125 MG PO TABS
1.0000 | ORAL_TABLET | Freq: Two times a day (BID) | ORAL | Status: DC
  Administered 2017-03-16 – 2017-03-17 (×3): 1 via ORAL
  Filled 2017-03-16 (×3): qty 1

## 2017-03-16 MED ORDER — HYDROXYZINE HCL 25 MG PO TABS: 25.0000 mg | ORAL_TABLET | Freq: Three times a day (TID) | ORAL | Status: DC | PRN

## 2017-03-16 MED ORDER — URSODIOL 300 MG PO CAPS
300.0000 mg | ORAL_CAPSULE | Freq: Three times a day (TID) | ORAL | Status: DC
  Administered 2017-03-16 – 2017-03-20 (×11): 300 mg via ORAL
  Filled 2017-03-16 (×14): qty 1

## 2017-03-16 MED ORDER — HYDROXYZINE HCL 25 MG PO TABS
25.0000 mg | ORAL_TABLET | Freq: Three times a day (TID) | ORAL | Status: DC
  Administered 2017-03-16 – 2017-03-19 (×10): 25 mg via ORAL
  Filled 2017-03-16 (×10): qty 1

## 2017-03-16 MED ORDER — DOCUSATE SODIUM 100 MG PO CAPS
200.0000 mg | ORAL_CAPSULE | Freq: Two times a day (BID) | ORAL | Status: DC
  Administered 2017-03-16 – 2017-03-20 (×8): 200 mg via ORAL
  Filled 2017-03-16 (×9): qty 2

## 2017-03-16 NOTE — Progress Notes (Signed)
[place] at [place] NAME: Travis Hawkins    MR#:  [id number]  DATE OF BIRTH:  [birth year]  SUBJECTIVE:  CHIEF COMPLAINT:   Chief Complaint  Patient presents with  . Abdominal Pain   Continues to complain of feeling nauseous and itching . REVIEW OF SYSTEMS:  Review of Systems  Constitutional: Negative for chills, fever and malaise/fatigue.  HENT: Negative for sore throat.   Eyes: Negative for blurred vision and double vision.  Respiratory: Negative for cough, hemoptysis, shortness of breath, wheezing and stridor.   Cardiovascular: Negative for chest pain, palpitations, orthopnea and leg swelling.  Gastrointestinal: Positive for abdominal pain and nausea. Negative for blood in stool, diarrhea, melena and vomiting.  Genitourinary: Negative for dysuria, flank pain and hematuria.  Musculoskeletal: Negative for back pain and joint pain.  Skin: Negative for rash.  Neurological: Negative for dizziness, sensory change, focal weakness, seizures, loss of consciousness, weakness and headaches.  Endo/Heme/Allergies: Negative for polydipsia.  Psychiatric/Behavioral: Negative for depression. The patient is not nervous/anxious.     DRUG ALLERGIES:  No Known Allergies VITALS:  Blood pressure 109/72, pulse 62, temperature 97.9 F (36.6 C), temperature source Oral, resp. rate 17, height 5\' 8"  (1.727 m), weight 194 lb 6.4 oz (88.2 kg), SpO2 98 %. PHYSICAL EXAMINATION:  Physical Exam  Constitutional: He is oriented to person, place, and time and well-developed, well-nourished, and in no distress.  HENT:  Head: Normocephalic.  Mouth/Throat: Oropharynx is clear and moist.  Eyes: Conjunctivae and EOM are normal. Pupils are equal, round, and reactive to light.  Neck: Normal range of motion. Neck supple. No JVD present. No tracheal deviation present.  Cardiovascular: Normal rate, regular rhythm and normal heart sounds. Exam reveals no gallop.  No murmur  heard. Pulmonary/Chest: Effort normal and breath sounds normal. No respiratory distress. He has no wheezes. He has no rales.  Abdominal: Soft. Bowel sounds are normal. He exhibits no distension. There is tenderness. There is no rebound.  Musculoskeletal: Normal range of motion. He exhibits no edema or tenderness.  Neurological: He is alert and oriented to person, place, and time. No cranial nerve deficit.  Skin: No rash noted. No erythema.  Jaundice  Psychiatric: Affect normal.   LABORATORY PANEL:  Male CBC Recent Labs  Lab 03/14/17 0610  WBC 4.7  HGB 13.4  HCT 39.2*  PLT 211   ------------------------------------------------------------------------------------------------------------------ Chemistries  Recent Labs  Lab 03/16/17 0449  NA 135  K 4.2  CL 104  CO2 22  GLUCOSE 92  BUN 9  CREATININE 1.01  CALCIUM 9.3  MG 2.0  AST 55*  ALT 98*  ALKPHOS 298*  BILITOT 7.8*   RADIOLOGY:  No results found. ASSESSMENT AND PLAN:    1.  Acalculous cholecystitis, jaundice with biliary obstruction status post ERCP and stent, elevated liver function test.    Liver function test improving Discontinue IV Zosyn Start patient on Augmentin Repeat LFTs in the morning  2.  Acute pancreatitis secondary to biliary obstruction.  Improved.  Repeat lipase in the morning  3.  Hypokalemia, improved with the supplement. 4.  Mood disorder on lamotrigine and olanzapine 5.  Impaired fasting glucose.  The patient is not a diabetic. 6.  Itching from hyperbilirubinemia.    Change to schedule atarax  Still very symptomatic I will start patient on ursodiol and    All the records are reviewed and case discussed with Care Management/Social Worker. Management plans discussed with the patient, family and they  are in agreement.  CODE STATUS: Full Code  TOTAL TIME TAKING CARE OF THIS PATIENT: 32 minutes.   More than 50% of the time was spent in counseling/coordination of care: YES  POSSIBLE D/C  IN 2 DAYS, DEPENDING ON CLINICAL CONDITION.   Dustin Flock M.D on 03/16/2017 at 2:19 PM  Between 7am to 6pm - Pager - [phone]  After 6pm go to [email or web address] - [facility]

## 2017-03-16 NOTE — Progress Notes (Signed)
03/16/2017  Subjective: Patient having some nausea and feels like he needs to have a bowel movement.  No further abdominal pain. His main complaint is itching.  Vital signs: Temp:  [97.9 F (36.6 C)-98.3 F (36.8 C)] 98.3 F (36.8 C) (11/17 0424) Pulse Rate:  [55-64] 55 (11/17 0424) Resp:  [16] 16 (11/17 0424) BP: (100-108)/(59-69) 100/59 (11/17 0424) SpO2:  [99 %-100 %] 99 % (11/17 0424)   Intake/Output: 11/16 0701 - 11/17 0700 In: 3479 [P.O.:990; I.V.:2348; IV Piggyback:141] Out: 875 [Urine:875] Last BM Date: 03/12/17  Physical Exam: Constitutional: No acute distress.  Jaundiced. Abdomen:  Soft, nondistended, nontender to palpation.  Labs:  Recent Labs    03/13/17 1506 03/14/17 0610  WBC 7.1 4.7  HGB 14.2 13.4  HCT 42.0 39.2*  PLT 240 211   Recent Labs    03/15/17 1633 03/16/17 0449  NA 135 135  K 4.3 4.2  CL 103 104  CO2 24 22  GLUCOSE 122* 92  BUN 9 9  CREATININE 0.76 1.01  CALCIUM 9.3 9.3   Recent Labs    03/13/17 1845  LABPROT 12.8  INR 0.97    Imaging: No results found.  Assessment/Plan: 41 yo male with elevated LFTs, s/p ERCP with stent placement, with acalculous cholecystitis.  --May transition to oral antibiotics today for 10-14 day course. --continue soft diet as tolerated --no surgical needs at this point.  No drains needed.  Surgery will sign off for now.  Patient should follow up in surgery office on discharge once his LFTs have improved.   Melvyn Neth, [place]

## 2017-03-17 DIAGNOSIS — L299 Pruritus, unspecified: Secondary | ICD-10-CM | POA: Diagnosis not present

## 2017-03-17 DIAGNOSIS — R748 Abnormal levels of other serum enzymes: Secondary | ICD-10-CM | POA: Diagnosis not present

## 2017-03-17 DIAGNOSIS — R17 Unspecified jaundice: Secondary | ICD-10-CM

## 2017-03-17 LAB — COMPREHENSIVE METABOLIC PANEL
ALBUMIN: 3.6 g/dL (ref 3.5–5.0)
ALT: 88 U/L — ABNORMAL HIGH (ref 17–63)
ANION GAP: 9 (ref 5–15)
AST: 53 U/L — ABNORMAL HIGH (ref 15–41)
Alkaline Phosphatase: 299 U/L — ABNORMAL HIGH (ref 38–126)
BILIRUBIN TOTAL: 7.8 mg/dL — AB (ref 0.3–1.2)
BUN: 12 mg/dL (ref 6–20)
CHLORIDE: 103 mmol/L (ref 101–111)
CO2: 22 mmol/L (ref 22–32)
Calcium: 9.3 mg/dL (ref 8.9–10.3)
Creatinine, Ser: 0.83 mg/dL (ref 0.61–1.24)
GFR calc Af Amer: >60 mL/min (ref >60)
Glucose, Bld: 99 mg/dL (ref 65–99)
Potassium: 4.1 mmol/L (ref 3.5–5.1)
Sodium: 134 mmol/L — ABNORMAL LOW (ref 135–145)
TOTAL PROTEIN: 7.1 g/dL (ref 6.5–8.1)

## 2017-03-17 NOTE — Progress Notes (Addendum)
Travis Antigua, MD [address] [place], [place], [place], [address] [address], [place], [place], [place], [address] Phone: [phone]  Fax: [phone]   Subjective: Pt. Complains of diffuse itching worse at night. Denies abdominal pain N/V. No fever/chills. Tolerating PO diet well.   Objective: Vital signs in last 24 hours: Vitals:   03/16/17 2110 03/17/17 0452 03/17/17 1205 03/17/17 2041  BP: 110/62 (!) 102/51 114/68 112/62  Pulse: 67 65 (!) 57 61  Resp: 19 19  18   Temp: 97.8 F (36.6 C) 98.1 F (36.7 C) 98.1 F (36.7 C) 97.9 F (36.6 C)  TempSrc: Oral Oral Oral Oral  SpO2: 98% 99% 99% 99%  Weight:      Height:       Weight change:   Intake/Output Summary (Last 24 hours) at 03/17/2017 2217 Last data filed at 03/17/2017 1323 Gross per 24 hour  Intake 2223.67 ml  Output 1325 ml  Net 898.67 ml     Exam: Cardiac: +S1, +S2, RRR, No edema Pulm: CTA b/l, Normal Resp Effort Abd: Soft, NT/ND, No HSM Skin: Warm, no rashes Neck: Supple, Trachea midline   Lab Results: @LABTEST2 @ Micro Results: Recent Results (from the past 240 hour(s))  Blood culture (routine x 2)     Status: None (Preliminary result)   Collection Time: 03/13/17  6:45 PM  Result Value Ref Range Status   Specimen Description BLOOD LEFT AC  Final   Special Requests   Final    BOTTLES DRAWN AEROBIC AND ANAEROBIC Blood Culture results may not be optimal due to an excessive volume of blood received in culture bottles   Culture NO GROWTH 4 DAYS  Final   Report Status PENDING  Incomplete  Blood culture (routine x 2)     Status: None (Preliminary result)   Collection Time: 03/13/17  6:45 PM  Result Value Ref Range Status   Specimen Description BLOOD LEFT FA  Final   Special Requests   Final    BOTTLES DRAWN AEROBIC AND ANAEROBIC Blood Culture adequate volume   Culture NO GROWTH 4 DAYS  Final   Report Status PENDING  Incomplete   Studies/Results: No results found. Medications:    Scheduled Meds: . amoxicillin-clavulanate  1 tablet Oral Q12H  . docusate sodium  200 mg Oral BID  . enoxaparin (LOVENOX) injection  40 mg Subcutaneous Q24H  . hydrOXYzine  25 mg Oral TID  . lamoTRIgine  50 mg Oral QHS  . OLANZapine  10 mg Oral QHS  . protein supplement shake  11 oz Oral BID BM  . ursodiol  300 mg Oral TID   Continuous Infusions: . 0.9 % NaCl with KCl 20 mEq / L 100 mL/hr at 03/17/17 0644   PRN Meds:.acetaminophen **OR** acetaminophen, fentaNYL (SUBLIMAZE) injection, ondansetron (ZOFRAN) IV, oxyCODONE, promethazine   Assessment: Active Problems:   Acute acalculous cholecystitis    Plan:  24 YOM with acute acalculous cholecystitis, pancreatitis, obstructive jaundice due to choledocholithiasis now s/p ERCP with sphincterotomy, stone extraction, CBD stent placement on 11/12 with improvement in Bilirubin but persistent pruritis and elevated liver enzymes  CT scan on 11/14 post ERCP showed stent in place Liver enzymes have improved overall Would recommend avoiding hepatotoxic drugs and reviewing his medications (like Zyprexa) and changing to alternatives if possible. Recommend changing Augmentin to Ciprofloxacin. Augmentin has a rare side effect of cholestatic jaundice, and would be ideal to take away that potential adverse effect since that could be potentially contributing to persistently elevated Bilirubin despite  relief of Bilary duct obstruction. Cipro can cause elevation of AST, ALT but does not have cholestatic jaundice listed as an adverse effects. I reviewed potential Gram negative coverage options with the pharmacist that would be better in this case of cholestatic jaundice, and we concluded that Cipro would be a better option  Can use Benadryl PRN for extreme pruritis Continue Ursodiol If Bilirubin does not improve can consider re-evaluation of stent. No evidence of cholangitis at this time  Dr. Marius Ditch will be seeing the patient over the week for further  recommendations.

## 2017-03-17 NOTE — Progress Notes (Signed)
[facility] [place] NAME: Travis Hawkins    MR#:  [id number]  DATE OF BIRTH:  [birth year]  SUBJECTIVE:  CHIEF COMPLAINT:   Chief Complaint  Patient presents with  . Abdominal Pain   Patient continues to complain of itching and states that it feels like it is burning REVIEW OF SYSTEMS:  Review of Systems  Constitutional: Negative for chills, fever and malaise/fatigue.  HENT: Negative for sore throat.   Eyes: Negative for blurred vision and double vision.  Respiratory: Negative for cough, hemoptysis, shortness of breath, wheezing and stridor.   Cardiovascular: Negative for chest pain, palpitations, orthopnea and leg swelling.  Gastrointestinal: Positive for abdominal pain and nausea. Negative for blood in stool, diarrhea, melena and vomiting.  Genitourinary: Negative for dysuria, flank pain and hematuria.  Musculoskeletal: Negative for back pain and joint pain.  Skin: Negative for rash.  Neurological: Negative for dizziness, sensory change, focal weakness, seizures, loss of consciousness, weakness and headaches.  Endo/Heme/Allergies: Negative for polydipsia.  Psychiatric/Behavioral: Negative for depression. The patient is not nervous/anxious.     DRUG ALLERGIES:  No Known Allergies VITALS:  Blood pressure 114/68, pulse (!) 57, temperature 98.1 F (36.7 C), temperature source Oral, resp. rate 19, height 5\' 8"  (1.727 m), weight 194 lb 6.4 oz (88.2 kg), SpO2 99 %. PHYSICAL EXAMINATION:  Physical Exam  Constitutional: He is oriented to person, place, and time and well-developed, well-nourished, and in no distress.  HENT:  Head: Normocephalic.  Mouth/Throat: Oropharynx is clear and moist.  Eyes: Conjunctivae and EOM are normal. Pupils are equal, round, and reactive to light.  Neck: Normal range of motion. Neck supple. No JVD present. No tracheal deviation present.  Cardiovascular: Normal rate, regular rhythm and normal heart sounds. Exam  reveals no gallop.  No murmur heard. Pulmonary/Chest: Effort normal and breath sounds normal. No respiratory distress. He has no wheezes. He has no rales.  Abdominal: Soft. Bowel sounds are normal. He exhibits no distension. There is tenderness. There is no rebound.  Musculoskeletal: Normal range of motion. He exhibits no edema or tenderness.  Neurological: He is alert and oriented to person, place, and time. No cranial nerve deficit.  Skin: No rash noted. No erythema.  Jaundice  Psychiatric: Affect normal.   LABORATORY PANEL:  Male CBC Recent Labs  Lab 03/14/17 0610  WBC 4.7  HGB 13.4  HCT 39.2*  PLT 211   ------------------------------------------------------------------------------------------------------------------ Chemistries  Recent Labs  Lab 03/16/17 0449 03/17/17 0450  NA 135 134*  K 4.2 4.1  CL 104 103  CO2 22 22  GLUCOSE 92 99  BUN 9 12  CREATININE 1.01 0.83  CALCIUM 9.3 9.3  MG 2.0  --   AST 55* 53*  ALT 98* 88*  ALKPHOS 298* 299*  BILITOT 7.8* 7.8*   RADIOLOGY:  No results found. ASSESSMENT AND PLAN:    1.  Acalculous cholecystitis, jaundice with biliary obstruction status post ERCP and stent, elevated liver function test.    Liver function test improving, bilirubin continues to be elevated Continue on Augmentin I discussed the case with the on-call GI physician regarding patient's concerns and persistent symptoms, they will come evaluate the patient   2.  Acute pancreatitis secondary to biliary obstruction.  Improved.    3.  Hypokalemia, improved with the supplement. 4.  Mood disorder on lamotrigine and olanzapine 5.  Impaired fasting glucose.  The patient is not a diabetic. 6.  Itching from hyperbilirubinemia.    Continue  Atarax still very symptomatic Continue ursodiol    All the records are reviewed and case discussed with Care Management/Social Worker. Management plans discussed with the patient, family and they are in agreement.  CODE  STATUS: Full Code  TOTAL TIME TAKING CARE OF THIS PATIENT: 32 minutes.   More than 50% of the time was spent in counseling/coordination of care: YES  POSSIBLE D/C IN 2 DAYS, DEPENDING ON CLINICAL CONDITION.   Dustin Flock M.D on 03/17/2017 at 3:18 PM  Between 7am to 6pm - Pager - [phone]  After 6pm go to [email or web address] - [facility]

## 2017-03-18 ENCOUNTER — Ambulatory Visit: Payer: PRIVATE HEALTH INSURANCE | Admitting: Gastroenterology

## 2017-03-18 DIAGNOSIS — K7589 Other specified inflammatory liver diseases: Secondary | ICD-10-CM | POA: Diagnosis not present

## 2017-03-18 DIAGNOSIS — K8050 Calculus of bile duct without cholangitis or cholecystitis without obstruction: Secondary | ICD-10-CM

## 2017-03-18 LAB — COMPREHENSIVE METABOLIC PANEL
ALT: 77 U/L — ABNORMAL HIGH (ref 17–63)
ANION GAP: 10 (ref 5–15)
AST: 53 U/L — AB (ref 15–41)
Albumin: 3.5 g/dL (ref 3.5–5.0)
Alkaline Phosphatase: 270 U/L — ABNORMAL HIGH (ref 38–126)
BILIRUBIN TOTAL: 7.4 mg/dL — AB (ref 0.3–1.2)
BUN: 12 mg/dL (ref 6–20)
CHLORIDE: 102 mmol/L (ref 101–111)
CO2: 22 mmol/L (ref 22–32)
Calcium: 9.6 mg/dL (ref 8.9–10.3)
Creatinine, Ser: 0.73 mg/dL (ref 0.61–1.24)
Glucose, Bld: 116 mg/dL — ABNORMAL HIGH (ref 65–99)
POTASSIUM: 4.1 mmol/L (ref 3.5–5.1)
Sodium: 134 mmol/L — ABNORMAL LOW (ref 135–145)
TOTAL PROTEIN: 7.3 g/dL (ref 6.5–8.1)

## 2017-03-18 LAB — CULTURE, BLOOD (ROUTINE X 2)
CULTURE: NO GROWTH
CULTURE: NO GROWTH
SPECIAL REQUESTS: ADEQUATE

## 2017-03-18 MED ORDER — DIPHENHYDRAMINE HCL 25 MG PO CAPS
50.0000 mg | ORAL_CAPSULE | Freq: Every evening | ORAL | Status: DC | PRN
  Administered 2017-03-18 – 2017-03-20 (×3): 50 mg via ORAL
  Filled 2017-03-18 (×3): qty 2

## 2017-03-18 MED ORDER — CIPROFLOXACIN HCL 500 MG PO TABS
500.0000 mg | ORAL_TABLET | Freq: Two times a day (BID) | ORAL | Status: DC
  Administered 2017-03-18 – 2017-03-20 (×5): 500 mg via ORAL
  Filled 2017-03-18 (×5): qty 1

## 2017-03-18 NOTE — Progress Notes (Signed)
Nutrition Follow Up Note   DOCUMENTATION CODES:   Not applicable  INTERVENTION:   Premier Protein BID, each supplement provides 160 kcal and 30 grams of protein.   Bowel regimen as needed per MD discretion   NUTRITION DIAGNOSIS:   Inadequate oral intake related to acute illness as evidenced by NPO status. - progressing- pt advanced to soft diet  GOAL:   Patient will meet greater than or equal to 90% of their needs  -progressing  MONITOR:   PO intake, Supplement acceptance, Labs, Weight trends, Skin   ASSESSMENT:   41 year old male with recent ERCP and stent placement for jaundice and biliary obstruction 11/12 admitted with elevated LFTs, pancreatitis, and either a calculus or calculus cholecystitis.   Pt doing well; eating 100% of soft diet. Ursodial initiated. Pt with constipation; recommend bowel as need per MD discretion. No new weight since admit; recommend obtain new weight.     Medications reviewed and include: ciprofloxacin, colace, lovenox, ursodiol    Labs reviewed: Na 134(L), AlkPhos 270(H), AST 53(H), ALT 77(H), Bili 7.4(H),  P- 3.9 wnl, Mg 2.0 wnl- 11/17 Lipase 288(H)- 11/16 Triglycerides- 426(H)- 11/16  Diet Order:  DIET SOFT Room service appropriate? Yes; Fluid consistency: Thin  EDUCATION NEEDS:   Education needs have been addressed  Skin:  Reviewed RN Assessment  Last BM:  11/18  Height:   Ht Readings from Last 1 Encounters:  03/13/17 5\' 8"  (1.727 m)    Weight:   Wt Readings from Last 1 Encounters:  03/13/17 194 lb 6.4 oz (88.2 kg)    Ideal Body Weight:  70 kg  BMI:  Body mass index is 29.56 kg/m.  Estimated Nutritional Needs:   Kcal:  2000-2300kcal/day   Protein:  88-106g/day   Fluid:  >2L/day   Koleen Distance MS, RD, LDN Pager #[phone] After Hours Pager: [phone]

## 2017-03-18 NOTE — Progress Notes (Signed)
[facility] [place] NAME: Travis Hawkins    MR#:  [id number]  DATE OF BIRTH:  01-24-1976  SUBJECTIVE:  CHIEF COMPLAINT:   Chief Complaint  Patient presents with  . Abdominal Pain   Continues to complain of itching and burning REVIEW OF SYSTEMS:  Review of Systems  Constitutional: Negative for chills, fever and malaise/fatigue.  HENT: Negative for sore throat.   Eyes: Negative for blurred vision and double vision.  Respiratory: Negative for cough, hemoptysis, shortness of breath, wheezing and stridor.   Cardiovascular: Negative for chest pain, palpitations, orthopnea and leg swelling.  Gastrointestinal: Positive for abdominal pain and nausea. Negative for blood in stool, diarrhea, melena and vomiting.  Genitourinary: Negative for dysuria, flank pain and hematuria.  Musculoskeletal: Negative for back pain and joint pain.  Skin: Positive for itching. Negative for rash.  Neurological: Negative for dizziness, sensory change, focal weakness, seizures, loss of consciousness, weakness and headaches.  Endo/Heme/Allergies: Negative for polydipsia.  Psychiatric/Behavioral: Negative for depression. The patient is not nervous/anxious.     DRUG ALLERGIES:  No Known Allergies VITALS:  Blood pressure 111/64, pulse 60, temperature 98.5 F (36.9 C), temperature source Oral, resp. rate 18, height 5\' 8"  (1.727 m), weight 194 lb 6.4 oz (88.2 kg), SpO2 99 %. PHYSICAL EXAMINATION:  Physical Exam  Constitutional: He is oriented to person, place, and time and well-developed, well-nourished, and in no distress.  HENT:  Head: Normocephalic.  Mouth/Throat: Oropharynx is clear and moist.  Eyes: Conjunctivae and EOM are normal. Pupils are equal, round, and reactive to light.  Neck: Normal range of motion. Neck supple. No JVD present. No tracheal deviation present.  Cardiovascular: Normal rate, regular rhythm and normal heart sounds. Exam reveals no gallop.    No murmur heard. Pulmonary/Chest: Effort normal and breath sounds normal. No respiratory distress. He has no wheezes. He has no rales.  Abdominal: Soft. Bowel sounds are normal. He exhibits no distension. There is tenderness. There is no rebound.  Musculoskeletal: Normal range of motion. He exhibits no edema or tenderness.  Neurological: He is alert and oriented to person, place, and time. No cranial nerve deficit.  Skin: No rash noted. No erythema.  Jaundice  Psychiatric: Affect normal.   LABORATORY PANEL:  Male CBC Recent Labs  Lab 03/14/17 0610  WBC 4.7  HGB 13.4  HCT 39.2*  PLT 211   ------------------------------------------------------------------------------------------------------------------ Chemistries  Recent Labs  Lab 03/16/17 0449  03/18/17 0807  NA 135   < > 134*  K 4.2   < > 4.1  CL 104   < > 102  CO2 22   < > 22  GLUCOSE 92   < > 116*  BUN 9   < > 12  CREATININE 1.01   < > 0.73  CALCIUM 9.3   < > 9.6  MG 2.0  --   --   AST 55*   < > 53*  ALT 98*   < > 77*  ALKPHOS 298*   < > 270*  BILITOT 7.8*   < > 7.4*   < > = values in this interval not displayed.   RADIOLOGY:  No results found. ASSESSMENT AND PLAN:    1.  Acalculous cholecystitis, jaundice with biliary obstruction status post ERCP and stent, elevated liver function test.    Liver function test improving, bilirubin continues to be elevated Patient's antibiotics changed to Cipro I discussed the case with  Dr.Wohl , he will do ERCP  with another stent placement tomorrow   2.  Acute pancreatitis secondary to biliary obstruction.  Improved.    3.  Hypokalemia, improved with the supplement. 4.  Mood disorder on lamotrigine and olanzapine 5.  Impaired fasting glucose.  The patient is not a diabetic. 6.  Itching from hyperbilirubinemia.    Continue Atarax still very symptomatic Continue ursodiol    All the records are reviewed and case discussed with Care Management/Social Worker. Management  plans discussed with the patient, family and they are in agreement.  CODE STATUS: Full Code  TOTAL TIME TAKING CARE OF THIS PATIENT: 32 minutes.   More than 50% of the time was spent in counseling/coordination of care: YES  POSSIBLE D/C IN 2 DAYS, DEPENDING ON CLINICAL CONDITION.   Dustin Flock M.D on 03/18/2017 at 2:05 PM  Between 7am to 6pm - Pager - [phone]  After 6pm go to [email or web address] - [facility]

## 2017-03-18 NOTE — Progress Notes (Signed)
Called Dr. Jannifer Franklin regarding anti- itch medication.  Appropriate orders were placed.  Travis Hawkins

## 2017-03-18 NOTE — Progress Notes (Signed)
[place], [place] [address]  [place]  [place], [place] [address]  Main: [phone]  Fax: [phone] Pager: [place] is being followed for cholestatic hepatitis   Subjective: Eating lunch. Feels nauseous, unable to sleep due to pruritus   Objective: Vital signs in last 24 hours: Vitals:   03/17/17 1205 03/17/17 2041 03/18/17 0542 03/18/17 1257  BP: 114/68 112/62 102/63 111/64  Pulse: (!) 57 61 66 60  Resp:  18 18   Temp: 98.1 F (36.7 C) 97.9 F (36.6 C) 98 F (36.7 C) 98.5 F (36.9 C)  TempSrc: Oral Oral Oral Oral  SpO2: 99% 99% 96% 99%  Weight:      Height:       Weight change:   Intake/Output Summary (Last 24 hours) at 03/18/2017 1420 Last data filed at 03/18/2017 1015 Gross per 24 hour  Intake 1324 ml  Output -  Net 1324 ml     Exam: Heart:: Regular rate and rhythm, S1S2 present or without murmur or extra heart sounds Lungs: normal and clear to auscultation Abdomen: soft, nontender, normal bowel sounds   Lab Results:  Hepatic Function Latest Ref Rng & Units 03/18/2017 03/17/2017 03/16/2017  Total Protein 6.5 - 8.1 g/dL 7.3 7.1 6.7  Albumin 3.5 - 5.0 g/dL 3.5 3.6 3.2(L)  AST 15 - 41 U/L 53(H) 53(H) 55(H)  ALT 17 - 63 U/L 77(H) 88(H) 98(H)  Alk Phosphatase 38 - 126 U/L 270(H) 299(H) 298(H)  Total Bilirubin 0.3 - 1.2 mg/dL 7.4(H) 7.8(H) 7.8(H)  Bilirubin, Direct 0.1 - 0.5 mg/dL - - -   CBC Latest Ref Rng & Units 03/14/2017 03/13/2017 03/09/2017  WBC 3.8 - 10.6 K/uL 4.7 7.1 5.6  Hemoglobin 13.0 - 18.0 g/dL 13.4 14.2 13.8  Hematocrit 40.0 - 52.0 % 39.2(L) 42.0 40.5  Platelets 150 - 440 K/uL 211 240 212   BMP Latest Ref Rng & Units 03/18/2017 03/17/2017 03/16/2017  Glucose 65 - 99 mg/dL 116(H) 99 92  BUN 6 - 20 mg/dL '12 12 9  ' Creatinine 0.61 - 1.24 mg/dL 0.73 0.83 1.01  Sodium 135 - 145 mmol/L 134(L) 134(L) 135  Potassium 3.5 - 5.1 mmol/L 4.1 4.1 4.2  Chloride 101 - 111 mmol/L 102 103 104  CO2 22 - 32 mmol/L  '22 22 22  ' Calcium 8.9 - 10.3 mg/dL 9.6 9.3 9.3   Micro Results: Recent Results (from the past 240 hour(s))  Blood culture (routine x 2)     Status: None   Collection Time: 03/13/17  6:45 PM  Result Value Ref Range Status   Specimen Description BLOOD LEFT AC  Final   Special Requests   Final    BOTTLES DRAWN AEROBIC AND ANAEROBIC Blood Culture results may not be optimal due to an excessive volume of blood received in culture bottles   Culture NO GROWTH 5 DAYS  Final   Report Status 03/18/2017 FINAL  Final  Blood culture (routine x 2)     Status: None   Collection Time: 03/13/17  6:45 PM  Result Value Ref Range Status   Specimen Description BLOOD LEFT FA  Final   Special Requests   Final    BOTTLES DRAWN AEROBIC AND ANAEROBIC Blood Culture adequate volume   Culture NO GROWTH 5 DAYS  Final   Report Status 03/18/2017 FINAL  Final   Studies/Results: No results found. Medications: I have reviewed the patient's current medications. Scheduled Meds: . ciprofloxacin  500 mg Oral BID  .  docusate sodium  200 mg Oral BID  . enoxaparin (LOVENOX) injection  40 mg Subcutaneous Q24H  . hydrOXYzine  25 mg Oral TID  . lamoTRIgine  50 mg Oral QHS  . OLANZapine  10 mg Oral QHS  . protein supplement shake  11 oz Oral BID BM  . 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 Pt readmitted with persistently elevated LFTs, RUQ and epigastric pain   Plan: Cholestatic hepatitis: No evidence of acute liver failure -  LFTs slowly improving - Most likely secondary to choledocholithiasis - PSC is another possibility, however MRCP was not suggestive of beaded appearance of bile ducts - He does not have cholangitis - Monitor LFTs closely - Blood cx negative, on empiric abx - Will reassess need for CBD stent evaluation tomorrow, NPO past midnight - Work up for other causes of cholestatic hepatitis such as PBC or AIH or IgG4 related cholangitis is negative. Recommend liver biopsy if LFTs not improving after stent exchange - Can continue urso - Can continue low dose Zyprexa as pt reported that he has been on it for depression for a long time  Thank you for involving me in the care of this patient.     LOS: 5 days   Shuntell Foody 03/18/2017, 2:20 PM

## 2017-03-19 ENCOUNTER — Inpatient Hospital Stay: Payer: Self-pay | Admitting: Certified Registered Nurse Anesthetist

## 2017-03-19 ENCOUNTER — Inpatient Hospital Stay: Payer: Self-pay

## 2017-03-19 ENCOUNTER — Encounter
Admission: EM | Disposition: A | Discharge status: Home / Self Care | Payer: Self-pay | Source: Home / Self Care | Attending: Internal Medicine

## 2017-03-19 ENCOUNTER — Encounter: Payer: Self-pay | Admitting: Certified Registered Nurse Anesthetist

## 2017-03-19 DIAGNOSIS — Z4659 Encounter for fitting and adjustment of other gastrointestinal appliance and device: Secondary | ICD-10-CM

## 2017-03-19 HISTORY — PX: ERCP: SHX5425

## 2017-03-19 LAB — HEPATIC FUNCTION PANEL
ALBUMIN: 3.9 g/dL (ref 3.5–5.0)
ALK PHOS: 313 U/L — AB (ref 38–126)
ALT: 79 U/L — AB (ref 17–63)
AST: 59 U/L — ABNORMAL HIGH (ref 15–41)
Bilirubin, Direct: 4.8 mg/dL — ABNORMAL HIGH (ref 0.1–0.5)
Indirect Bilirubin: 3.2 mg/dL — ABNORMAL HIGH (ref 0.3–0.9)
TOTAL PROTEIN: 8.3 g/dL — AB (ref 6.5–8.1)
Total Bilirubin: 8 mg/dL — ABNORMAL HIGH (ref 0.3–1.2)

## 2017-03-19 SURGERY — ERCP, WITH INTERVENTION IF INDICATED
Anesthesia: General

## 2017-03-19 MED ORDER — PROPOFOL 500 MG/50ML IV EMUL
INTRAVENOUS | Status: DC | PRN
  Administered 2017-03-19: 150 ug/kg/min via INTRAVENOUS

## 2017-03-19 MED ORDER — PROPOFOL 500 MG/50ML IV EMUL
INTRAVENOUS | Status: AC
  Filled 2017-03-19: qty 50

## 2017-03-19 MED ORDER — SODIUM CHLORIDE 0.9 % IV SOLN
INTRAVENOUS | Status: DC
  Administered 2017-03-19: 1000 mL via INTRAVENOUS

## 2017-03-19 MED ORDER — PROPOFOL 10 MG/ML IV BOLUS
INTRAVENOUS | Status: DC | PRN
  Administered 2017-03-19: 40 mg via INTRAVENOUS
  Administered 2017-03-19 (×2): 50 mg via INTRAVENOUS
  Administered 2017-03-19: 30 mg via INTRAVENOUS

## 2017-03-19 MED ORDER — DIPHENHYDRAMINE HCL 25 MG PO CAPS
50.0000 mg | ORAL_CAPSULE | Freq: Two times a day (BID) | ORAL | Status: DC | PRN
  Administered 2017-03-19: 50 mg via ORAL
  Filled 2017-03-19: qty 2

## 2017-03-19 MED ORDER — WITCH HAZEL-GLYCERIN EX PADS
MEDICATED_PAD | CUTANEOUS | Status: DC | PRN
  Filled 2017-03-19: qty 100

## 2017-03-19 MED ORDER — LIDOCAINE HCL (PF) 2 % IJ SOLN
INTRAMUSCULAR | Status: AC
  Filled 2017-03-19: qty 10

## 2017-03-19 MED ORDER — LIDOCAINE HCL (CARDIAC) 20 MG/ML IV SOLN
INTRAVENOUS | Status: DC | PRN
  Administered 2017-03-19: 50 mg via INTRAVENOUS

## 2017-03-19 NOTE — Op Note (Signed)
[facility] Gastroenterology Patient Name: Travis Hawkins Procedure Date: 03/19/2017 10:21 AM MRN: [id number] Account #: [id number] Date of Birth: 05-Sep-1975 Admit Type: Inpatient Age: 41 Room: [facility] ENDO ROOM 4 Gender: Male Note Status: Finalized Procedure:            ERCP Indications:          Stent change Providers:            Lucilla Lame MD, MD Referring MD:         No Local Md, MD (Referring MD) Medicines:            Propofol per Anesthesia Complications:        No immediate complications. Procedure:            Pre-Anesthesia Assessment:                       - Prior to the procedure, a History and Physical was                        performed, and patient medications and allergies were                        reviewed. The patient's tolerance of previous                        anesthesia was also reviewed. The risks and benefits of                        the procedure and the sedation options and risks were                        discussed with the patient. All questions were                        answered, and informed consent was obtained. Prior                        Anticoagulants: The patient has taken no previous                        anticoagulant or antiplatelet agents. ASA Grade                        Assessment: II - A patient with mild systemic disease.                        After reviewing the risks and benefits, the patient was                        deemed in satisfactory condition to undergo the                        procedure.                       After obtaining informed consent, the scope was passed                        under direct vision. Throughout the procedure, the  patient's blood pressure, pulse, and oxygen saturations                        were monitored continuously. The Endosonoscope was                        introduced through the mouth, and used to inject                        contrast into and used  to inject contrast into the bile                        duct. The ERCP was accomplished without difficulty. The                        patient tolerated the procedure well. Findings:      A biliary stent was visible on the scout film. The esophagus was       successfully intubated under direct vision. The scope was advanced to a       normal major papilla in the descending duodenum without detailed       examination of the pharynx, larynx and associated structures, and upper       GI tract. The upper GI tract was grossly normal. The bile duct was       deeply cannulated with the short-nosed traction sphincterotome. Contrast       was injected. I personally interpreted the bile duct images. There was       brisk flow of contrast through the ducts. Image quality was excellent.       Contrast extended to the entire biliary tree. A wire was passed into the       biliary tree. One stent was removed from the biliary tree using a snare.       Cells for cytology were obtained by brushing. To discover objects, the       biliary tree was swept with a 15 mm balloon starting at the bifurcation.       Nothing was found. One 10 Fr by 7 cm plastic stent with a single       external flap and a single internal flap was placed 6 cm into the common       bile duct. Bile flowed through the stent. The stent was in good position. Impression:           - One stent was removed from the biliary tree.                       - The biliary tree was swept and nothing was found.                       - One plastic stent was placed into the common bile                        duct.                       - The previously placed stent appeared to be draining                        well. Recommendation:       - Watch for pancreatitis,  bleeding, perforation, and                        cholangitis.                       - Repeat ERCP in 2 months to remove stent. Procedure Code(s):    --- Professional ---                        [phone], Endoscopic retrograde cholangiopancreatography                        (ERCP); with removal and exchange of stent(s), biliary                        or pancreatic duct, including pre- and post-dilation                        and guide wire passage, when performed, including                        sphincterotomy, when performed, each stent exchanged                       [phone], Endoscopic catheterization of the biliary ductal                        system, radiological supervision and interpretation Diagnosis Code(s):    --- Professional ---                       Q22.29, Encounter for fitting and adjustment of other                        gastrointestinal appliance and device CPT copyright 2016 [employer]. All rights reserved. The codes documented in this report are preliminary and upon coder review may  be revised to meet current compliance requirements. Lucilla Lame MD, MD 03/19/2017 10:59:46 AM This report has been signed electronically. Number of Addenda: 0 Note Initiated On: 03/19/2017 10:21 AM      [facility]

## 2017-03-19 NOTE — Progress Notes (Signed)
[place] at [place] NAME: Travis Hawkins    MR#:  [id number]  DATE OF BIRTH:  1976/01/12  SUBJECTIVE:  CHIEF COMPLAINT:   Chief Complaint  Patient presents with  . Abdominal Pain   Patient without any significant improvement in his itching and jaundice REVIEW OF SYSTEMS:  Review of Systems  Constitutional: Negative for chills, fever and malaise/fatigue.  HENT: Negative for sore throat.   Eyes: Negative for blurred vision and double vision.  Respiratory: Negative for cough, hemoptysis, shortness of breath, wheezing and stridor.   Cardiovascular: Negative for chest pain, palpitations, orthopnea and leg swelling.  Gastrointestinal: Positive for nausea. Negative for abdominal pain, blood in stool, diarrhea, melena and vomiting.  Genitourinary: Negative for dysuria, flank pain and hematuria.  Musculoskeletal: Negative for back pain and joint pain.  Skin: Positive for itching. Negative for rash.  Neurological: Negative for dizziness, sensory change, focal weakness, seizures, loss of consciousness, weakness and headaches.  Endo/Heme/Allergies: Negative for polydipsia.  Psychiatric/Behavioral: Negative for depression. The patient is not nervous/anxious.     DRUG ALLERGIES:  No Known Allergies VITALS:  Blood pressure 116/80, pulse 64, temperature (!) 97.4 F (36.3 C), temperature source Tympanic, resp. rate 17, height 5\' 8"  (1.727 m), weight 194 lb 6.4 oz (88.2 kg), SpO2 99 %. PHYSICAL EXAMINATION:  Physical Exam  Constitutional: He is oriented to person, place, and time and well-developed, well-nourished, and in no distress.  HENT:  Head: Normocephalic.  Mouth/Throat: Oropharynx is clear and moist.  Eyes: Conjunctivae and EOM are normal. Pupils are equal, round, and reactive to light.  Neck: Normal range of motion. Neck supple. No JVD present. No tracheal deviation present.  Cardiovascular: Normal rate, regular rhythm and normal heart  sounds. Exam reveals no gallop.  No murmur heard. Pulmonary/Chest: Effort normal and breath sounds normal. No respiratory distress. He has no wheezes. He has no rales.  Abdominal: Soft. Bowel sounds are normal. He exhibits no distension. There is tenderness. There is no rebound.  Musculoskeletal: Normal range of motion. He exhibits no edema or tenderness.  Neurological: He is alert and oriented to person, place, and time. No cranial nerve deficit.  Skin: No rash noted. No erythema.  Jaundice  Psychiatric: Affect normal.   LABORATORY PANEL:  Male CBC Recent Labs  Lab 03/14/17 0610  WBC 4.7  HGB 13.4  HCT 39.2*  PLT 211   ------------------------------------------------------------------------------------------------------------------ Chemistries  Recent Labs  Lab 03/16/17 0449  03/18/17 0807 03/19/17 0532  NA 135   < > 134*  --   K 4.2   < > 4.1  --   CL 104   < > 102  --   CO2 22   < > 22  --   GLUCOSE 92   < > 116*  --   BUN 9   < > 12  --   CREATININE 1.01   < > 0.73  --   CALCIUM 9.3   < > 9.6  --   MG 2.0  --   --   --   AST 55*   < > 53* 59*  ALT 98*   < > 77* 79*  ALKPHOS 298*   < > 270* 313*  BILITOT 7.8*   < > 7.4* 8.0*   < > = values in this interval not displayed.   RADIOLOGY:  Dg C-arm 1-60 Min-no Report  Result Date: 03/19/2017 Fluoroscopy was utilized by the requesting physician.  No radiographic interpretation.  ASSESSMENT AND PLAN:    1.  Acalculous cholecystitis, jaundice with biliary obstruction status post ERCP and stent, elevated liver function test.    No significant improvement in the liver function test patient underwent ERCP again today with the stent replacement Continue Cipro I discussed with Dr. Adora Fridge of surgery who will evaluate the patient patient will likely need a lap cholecystectomy   2.  Acute pancreatitis secondary to biliary obstruction.  Resolved  3.  Hypokalemia, improved with the supplement. 4.  Mood disorder on  lamotrigine and olanzapine 5.  Impaired fasting glucose.  The patient is not a diabetic. 6.  Itching from hyperbilirubinemia.    Continue Atarax still very symptomatic Continue ursodiol    All the records are reviewed and case discussed with Care Management/Social Worker. Management plans discussed with the patient, family and they are in agreement.  CODE STATUS: Full Code  TOTAL TIME TAKING CARE OF THIS PATIENT: 32 minutes.   More than 50% of the time was spent in counseling/coordination of care: YES  POSSIBLE D/C IN 2 DAYS, DEPENDING ON CLINICAL CONDITION.   Dustin Flock M.D on 03/19/2017 at 2:23 PM  Between 7am to 6pm - Pager - [phone]  After 6pm go to [email or web address] - [place] [facility]

## 2017-03-19 NOTE — Plan of Care (Signed)
Patient is progressing and will get ERCP in morning. Travis Hawkins

## 2017-03-19 NOTE — Anesthesia Preprocedure Evaluation (Signed)
Anesthesia Evaluation  Patient identified by MRN, date of birth, ID band Patient awake    Reviewed: Allergy & Precautions, H&P , NPO status , Patient's Chart, lab work & pertinent test results, reviewed documented beta blocker date and time   Airway Mallampati: II   Neck ROM: full    Dental  (+) Poor Dentition   Pulmonary neg pulmonary ROS, Current Smoker,    Pulmonary exam normal        Cardiovascular negative cardio ROS Normal cardiovascular exam Rhythm:regular Rate:Normal     Neuro/Psych negative neurological ROS  negative psych ROS   GI/Hepatic negative GI ROS, Neg liver ROS,   Endo/Other  negative endocrine ROS  Renal/GU negative Renal ROS  negative genitourinary   Musculoskeletal   Abdominal   Peds  Hematology negative hematology ROS (+)   Anesthesia Other Findings Past Medical History: No date: Biliary obstruction No date: Depression No date: Mood disorder [facility]) Past Surgical History: 03/11/2017: ENDOSCOPIC RETROGRADE CHOLANGIOPANCREATOGRAPHY (ERCP)  WITH PROPOFOL; N/A     Comment:  Performed by Lucilla Lame, MD at [facility] ENDOSCOPY No date: none BMI    Body Mass Index:  29.56 kg/m     Reproductive/Obstetrics negative OB ROS                             Anesthesia Physical Anesthesia Plan  ASA: II and emergent  Anesthesia Plan: General   Post-op Pain Management:    Induction:   PONV Risk Score and Plan:   Airway Management Planned:   Additional Equipment:   Intra-op Plan:   Post-operative Plan:   Informed Consent: I have reviewed the patients History and Physical, chart, labs and discussed the procedure including the risks, benefits and alternatives for the proposed anesthesia with the patient or authorized representative who has indicated his/her understanding and acceptance.   Dental Advisory Given  Plan Discussed with: CRNA  Anesthesia Plan Comments:          Anesthesia Quick Evaluation

## 2017-03-19 NOTE — Anesthesia Post-op Follow-up Note (Signed)
Anesthesia QCDR form completed.        

## 2017-03-19 NOTE — Transfer of Care (Signed)
Immediate Anesthesia Transfer of Care Note  Patient: Travis Hawkins  Procedure(s) Performed: ENDOSCOPIC RETROGRADE CHOLANGIOPANCREATOGRAPHY (ERCP) (N/A )  Patient Location: PACU  Anesthesia Type:General  Level of Consciousness: awake  Airway & Oxygen Therapy: Patient Spontanous Breathing and Patient connected to nasal cannula oxygen  Post-op Assessment: Report given to RN and Post -op Vital signs reviewed and stable  Post vital signs: Reviewed and stable  Last Vitals:  Vitals:   03/19/17 0610 03/19/17 1100  BP: 112/69 113/74  Pulse: (!) 58 70  Resp:    Temp: 36.7 C (!) 36.3 C  SpO2: 97% 99%    Last Pain:  Vitals:   03/19/17 1100  TempSrc: Tympanic  PainSc:       Patients Stated Pain Goal: 0 (53/64/68 0321)  Complications: No apparent anesthesia complications

## 2017-03-19 NOTE — Anesthesia Procedure Notes (Signed)
Date/Time: 03/19/2017 10:20 AM Performed by: Johnna Acosta, CRNA Pre-anesthesia Checklist: Patient identified, Emergency Drugs available, Suction available, Patient being monitored and Timeout performed Patient Re-evaluated:Patient Re-evaluated prior to induction Oxygen Delivery Method: Nasal cannula

## 2017-03-20 ENCOUNTER — Other Ambulatory Visit: Payer: Self-pay

## 2017-03-20 ENCOUNTER — Encounter: Payer: Self-pay | Admitting: Gastroenterology

## 2017-03-20 DIAGNOSIS — K7589 Other specified inflammatory liver diseases: Secondary | ICD-10-CM | POA: Diagnosis not present

## 2017-03-20 DIAGNOSIS — K7689 Other specified diseases of liver: Secondary | ICD-10-CM | POA: Diagnosis not present

## 2017-03-20 DIAGNOSIS — K831 Obstruction of bile duct: Secondary | ICD-10-CM

## 2017-03-20 LAB — COMPREHENSIVE METABOLIC PANEL
ALBUMIN: 4.1 g/dL (ref 3.5–5.0)
ALK PHOS: 291 U/L — AB (ref 38–126)
ALT: 75 U/L — AB (ref 17–63)
ANION GAP: 13 (ref 5–15)
AST: 56 U/L — AB (ref 15–41)
BUN: 13 mg/dL (ref 6–20)
CO2: 22 mmol/L (ref 22–32)
CREATININE: 0.81 mg/dL (ref 0.61–1.24)
Calcium: 10 mg/dL (ref 8.9–10.3)
Chloride: 100 mmol/L — ABNORMAL LOW (ref 101–111)
GFR calc Af Amer: >60 mL/min (ref >60)
GFR calc non Af Amer: >60 mL/min (ref >60)
GLUCOSE: 126 mg/dL — AB (ref 65–99)
Potassium: 4.1 mmol/L (ref 3.5–5.1)
SODIUM: 135 mmol/L (ref 135–145)
Total Bilirubin: 8.7 mg/dL — ABNORMAL HIGH (ref 0.3–1.2)
Total Protein: 8 g/dL (ref 6.5–8.1)

## 2017-03-20 LAB — BILIRUBIN, FRACTIONATED(TOT/DIR/INDIR)
BILIRUBIN INDIRECT: 3.6 mg/dL — AB (ref 0.3–0.9)
Bilirubin, Direct: 5.3 mg/dL — ABNORMAL HIGH (ref 0.1–0.5)
Total Bilirubin: 8.9 mg/dL — ABNORMAL HIGH (ref 0.3–1.2)

## 2017-03-20 MED ORDER — SODIUM CHLORIDE 0.9 % IV SOLN: INTRAVENOUS | Status: DC

## 2017-03-20 MED ORDER — ENOXAPARIN SODIUM 40 MG/0.4ML ~~LOC~~ SOLN: 40.0000 mg | SUBCUTANEOUS | Status: DC

## 2017-03-20 MED ORDER — URSODIOL 300 MG PO CAPS: 300.0000 mg | ORAL_CAPSULE | Freq: Three times a day (TID) | ORAL | 1 refills | Status: DC

## 2017-03-20 MED ORDER — HYDROXYZINE HCL 25 MG PO TABS
50.0000 mg | ORAL_TABLET | Freq: Three times a day (TID) | ORAL | Status: DC
  Administered 2017-03-20: 50 mg via ORAL
  Filled 2017-03-20: qty 2

## 2017-03-20 MED ORDER — HYDROXYZINE HCL 25 MG PO TABS: 25.0000 mg | ORAL_TABLET | Freq: Four times a day (QID) | ORAL | Status: DC

## 2017-03-20 MED ORDER — HYDROXYZINE HCL 50 MG PO TABS: 50.0000 mg | ORAL_TABLET | Freq: Three times a day (TID) | ORAL | 0 refills | Status: DC

## 2017-03-20 MED ORDER — CIPROFLOXACIN HCL 500 MG PO TABS: 500.0000 mg | ORAL_TABLET | Freq: Two times a day (BID) | ORAL | 0 refills | Status: DC

## 2017-03-20 MED ORDER — DIPHENHYDRAMINE HCL 50 MG PO CAPS: 50.0000 mg | ORAL_CAPSULE | Freq: Every evening | ORAL | 0 refills | Status: DC | PRN

## 2017-03-20 NOTE — Care Management (Signed)
Patient to discharge home today.  Patient is self pay, does not have PCP. Patient to discharge with atarax, cipro and Actigall.  Patient provided with coupons for Atarax and cipro, however Actigall with coupon was $49. Patient states that he would not be able to afford this.  Patient enrolled in [facility] program, given card, and list of participating pharmacies.  Patient provided with application to [facility], Medication Management, and "The Network:  Your Guide to [employer] and [employer] in [facility]"  Booklet.  RNCM signing off.

## 2017-03-20 NOTE — Progress Notes (Signed)
[place] at [place] NAME: Travis Hawkins    MR#:  [id number]  DATE OF BIRTH:  1975-12-23  SUBJECTIVE:    REVIEW OF SYSTEMS:   ROS Tolerating Diet: Tolerating PT:   DRUG ALLERGIES:  No Known Allergies  VITALS:  Blood pressure 118/61, pulse 63, temperature 97.9 F (36.6 C), temperature source Oral, resp. rate 18, height 5\' 8"  (1.727 m), weight 88.2 kg (194 lb 6.4 oz), SpO2 100 %.  PHYSICAL EXAMINATION:   Physical Exam  GENERAL:  41 y.o.-year-old patient lying in the bed with no acute distress.  EYES: Pupils equal, round, reactive to light and accommodation. No scleral icterus. Extraocular muscles intact.  HEENT: Head atraumatic, normocephalic. Oropharynx and nasopharynx clear.  NECK:  Supple, no jugular venous distention. No thyroid enlargement, no tenderness.  LUNGS: Normal breath sounds bilaterally, no wheezing, rales, rhonchi. No use of accessory muscles of respiration.  CARDIOVASCULAR: S1, S2 normal. No murmurs, rubs, or gallops.  ABDOMEN: Soft, nontender, nondistended. Bowel sounds present. No organomegaly or mass.  EXTREMITIES: No cyanosis, clubbing or edema b/l.    NEUROLOGIC: Cranial nerves II through XII are intact. No focal Motor or sensory deficits b/l.   PSYCHIATRIC:  patient is alert and oriented x 3.  SKIN: No obvious rash, lesion, or ulcer.   LABORATORY PANEL:  CBC Recent Labs  Lab 03/14/17 0610  WBC 4.7  HGB 13.4  HCT 39.2*  PLT 211    Chemistries  Recent Labs  Lab 03/16/17 0449  03/20/17 0610 03/20/17 0813  NA 135   < > 135  --   K 4.2   < > 4.1  --   CL 104   < > 100*  --   CO2 22   < > 22  --   GLUCOSE 92   < > 126*  --   BUN 9   < > 13  --   CREATININE 1.01   < > 0.81  --   CALCIUM 9.3   < > 10.0  --   MG 2.0  --   --   --   AST 55*   < > 56*  --   ALT 98*   < > 75*  --   ALKPHOS 298*   < > 291*  --   BILITOT 7.8*   < > 8.7* 8.9*   < > = values in this interval not displayed.    Cardiac Enzymes No results for input(s): TROPONINI in the last 168 hours. RADIOLOGY:  Dg C-arm 1-60 Min-no Report  Result Date: 03/19/2017 Fluoroscopy was utilized by the requesting physician.  No radiographic interpretation.   ASSESSMENT AND PLAN:  1.Cholestatsis Hepatitis: No shock liver or liver failure -pt presented with jaundice with biliary obstruction - status post ERCP and stent  , elevated liver function test.   -No significant improvement in the liver function test patient underwent ERCP again today with the stent replacement -Continue Cipro empirically -Dr Dahlia Byes does not recommends GB removal since that is not the issue. Recommends pt follow at [facility] dr Cloyd Stagers (hepatobiliary) -GI Dr Marius Ditch recommends pt get liver biopsy. Spoke at length with pt and family and explained the need to get biopsy. They are frustrated since pt has been here for 1 week and no diagnosis has been found. Liver biopsy scheduled for Friday nov 23rd. -Serology w/u for PBC, AIH and IgG4 negative so far  2. Acute pancreatitis secondary to biliary obstruction.  Resolved  3. Hypokalemia, improved with the supplement.  4. Mood disorder on lamotrigine and olanzapine  5. Impaired fasting glucose. The patient is not a diabetic.  6. Itching from hyperbilirubinemia.  - Continue Atarax still very symptomatic -Continue ursodiol    Case discussed with Care Management/Social Worker. Management plans discussed with the patient, family and they are in agreement.  CODE STATUS: full  DVT Prophylaxis: lovenox  TOTAL TIME TAKING CARE OF THIS PATIENT: 30 minutes.  >50% time spent on counselling and coordination of care  POSSIBLE D/C IN 1-2 DAYS, DEPENDING ON CLINICAL CONDITION.  Note: This dictation was prepared with Dragon dictation along with smaller phrase technology. Any transcriptional errors that result from this process are unintentional.  Fritzi Mandes M.D on 03/20/2017 at 11:41 AM  Between  7am to 6pm - Pager - [phone]  After 6pm go to [email or web address] - password [email or web address] [place] [facility]  Office  [phone]  CC: Primary care physician; Patient, No Pcp Per

## 2017-03-20 NOTE — Progress Notes (Signed)
Travis Darby, MD [address]  [place]  [place], [place] [address]  Main: [phone]  Fax: [phone] Pager: [place] is being followed for cholestatic hepatitis   Subjective: Continues to have pruritus, fiance bedside. He underwent ERCP yesterday with CBD stent exchange. There was free flow of bile with no stent obstruction. Bilirubin still high  Objective: Vital signs in last 24 hours: Vitals:   03/19/17 1120 03/19/17 1130 03/19/17 2031 03/20/17 0450  BP: 118/75 116/80 123/66 118/61  Pulse: 62 64 (!) 59 63  Resp: '15 17 18   ' Temp:   97.7 F (36.5 C) 97.9 F (36.6 C)  TempSrc:    Oral  SpO2: 100% 99% 100% 100%  Weight:      Height:       Weight change:   Intake/Output Summary (Last 24 hours) at 03/20/2017 1659 Last data filed at 03/20/2017 1359 Gross per 24 hour  Intake 720 ml  Output -  Net 720 ml     Exam: Heart:: Regular rate and rhythm, S1S2 present or without murmur or extra heart sounds Lungs: normal and clear to auscultation Abdomen: soft, nontender, normal bowel sounds   Lab Results:  Hepatic Function Latest Ref Rng & Units 03/20/2017 03/20/2017 03/19/2017  Total Protein 6.5 - 8.1 g/dL - 8.0 8.3(H)  Albumin 3.5 - 5.0 g/dL - 4.1 3.9  AST 15 - 41 U/L - 56(H) 59(H)  ALT 17 - 63 U/L - 75(H) 79(H)  Alk Phosphatase 38 - 126 U/L - 291(H) 313(H)  Total Bilirubin 0.3 - 1.2 mg/dL 8.9(H) 8.7(H) 8.0(H)  Bilirubin, Direct 0.1 - 0.5 mg/dL 5.3(H) - 4.8(H)   CBC Latest Ref Rng & Units 03/14/2017 03/13/2017 03/09/2017  WBC 3.8 - 10.6 K/uL 4.7 7.1 5.6  Hemoglobin 13.0 - 18.0 g/dL 13.4 14.2 13.8  Hematocrit 40.0 - 52.0 % 39.2(L) 42.0 40.5  Platelets 150 - 440 K/uL 211 240 212   BMP Latest Ref Rng & Units 03/20/2017 03/18/2017 03/17/2017  Glucose 65 - 99 mg/dL 126(H) 116(H) 99  BUN 6 - 20 mg/dL '13 12 12  ' Creatinine 0.61 - 1.24 mg/dL 0.81 0.73 0.83  Sodium 135 - 145 mmol/L 135 134(L) 134(L)  Potassium 3.5 - 5.1 mmol/L 4.1 4.1  4.1  Chloride 101 - 111 mmol/L 100(L) 102 103  CO2 22 - 32 mmol/L '22 22 22  ' Calcium 8.9 - 10.3 mg/dL 10.0 9.6 9.3   Micro Results: Recent Results (from the past 240 hour(s))  Blood culture (routine x 2)     Status: None   Collection Time: 03/13/17  6:45 PM  Result Value Ref Range Status   Specimen Description BLOOD LEFT AC  Final   Special Requests   Final    BOTTLES DRAWN AEROBIC AND ANAEROBIC Blood Culture results may not be optimal due to an excessive volume of blood received in culture bottles   Culture NO GROWTH 5 DAYS  Final   Report Status 03/18/2017 FINAL  Final  Blood culture (routine x 2)     Status: None   Collection Time: 03/13/17  6:45 PM  Result Value Ref Range Status   Specimen Description BLOOD LEFT FA  Final   Special Requests   Final    BOTTLES DRAWN AEROBIC AND ANAEROBIC Blood Culture adequate volume   Culture NO GROWTH 5 DAYS  Final   Report Status 03/18/2017 FINAL  Final   Studies/Results: Dg C-arm 1-60 Min-no Report  Result Date: 03/19/2017 Fluoroscopy was utilized  by the requesting physician.  No radiographic interpretation.   Medications: I have reviewed the patient's current medications. Scheduled Meds: . ciprofloxacin  500 mg Oral BID  . docusate sodium  200 mg Oral BID  . enoxaparin (LOVENOX) injection  40 mg Subcutaneous Q24H  . [START ON 03/22/2017] enoxaparin (LOVENOX) injection  40 mg Subcutaneous Q24H  . hydrOXYzine  50 mg Oral TID  . lamoTRIgine  50 mg Oral QHS  . OLANZapine  10 mg Oral QHS  . protein supplement shake  11 oz Oral BID BM  . ursodiol  300 mg Oral TID   Continuous Infusions: . 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 Pt readmitted with persistently elevated LFTs, RUQ and epigastric pain. He underwent repeat ERCP on 03/20/17 with CBD stent exchange, bile duct brushings. His Bilirubin is still high. He is seen by Dr Dahlia Byes who deferred cholecystectomy at this time.   Plan: Cholestatic hepatitis: No evidence of acute liver failure - LFTs persistently elevated - He does not have cholangitis - Blood cx negative, on empiric abx with cipro - Work up for other causes of cholestatic hepatitis such as PBC or AIH or IgG4 related cholangitis is negative. Ferritin is elevated which is a marker of acute inflammation, recheck ferritin as outpt, if still elevated check for HFE gene mutation - Acute viral hepatitis panel and HIV negative - Recommend liver biopsy and we arranged it for Friday at 11am this week - He can be discharged home today on atarax, ursodiol and cipro - Will arrange f/u with Dr Allen Norris to see him next week - Will f/u on liver biopsy results - Advised pt to avoid ETOH, OTC meds, NSAIDs  Thank you for involving me in the care of this patient.     LOS: 7 days   Travis Hawkins 03/20/2017, 4:59 PM

## 2017-03-20 NOTE — Progress Notes (Signed)
41 year old male known to our service. 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 The patient underwent ERCP 8 days ago. He also had a repositioning of the stent yesterday and on the last ERCP is found to have good bile flow into the duodenum. He did however continued to have elevation of the bilirubin and alkaline phosphatase likely related to a cholestatic process. The patient currently is not septic not toxic not showing any signs of cholecystitis or cholangitis. His physical exam shows an abdomen that is soft, nontender no evidence of peritonitis or Murphy sign.  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 Given the complexity of his biliary disease I firmly believe that he will benefit from endoscopic ultrasound and further biliary imaging, liver biopsy at a tertiary facility. I do recommend evaluation by hepatobiliary surgery because he will likely need a more definitive procedure for his biliary stricture. I do not believe that performing a cholecystectomy would be in his best interest or will solve any of the cholestatic issues. After discussed my thought process with the patient in detail and with Dr. Posey Pronto. I have spent greater than 35 minutes in this encounter with more than 50% of the time spent in coordination and counseling of his care

## 2017-03-20 NOTE — Discharge Summary (Signed)
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 Biliary obstruction   . Depression   . Mood disorder [facility])     HOSPITAL COURSE:  Travis Hawkins  is a 41 y.o. male with recent ERCP and stent placement for jaundice and biliary obstruction.  The patient went home and has been lying around.  He started developing abdominal pain.  He has a lot of nausea.  Pain is worse with moving around.  Still having itching.  1.Cholestatsis Hepatitis: No shock liver or liver failure -pt presented with jaundice with biliary obstruction - status post ERCP and stent, continues with elevated bilirubin -No significant improvement in the liver function test patient underwent ERCP again with the stent replacement (03/19/17) -Continue Cipro empirically -Dr Dahlia Byes does not recommends GB removal since that is not the issue. Recommends pt follow at [facility] dr Cloyd Stagers (hepatobiliary) -GI Dr Marius Ditch recommends pt get liver biopsy. Spoke at length with pt and family and explained the need to get biopsy. They are frustrated since pt has been here for 1 week and no diagnosis has been found. Liver biopsy scheduled for Friday nov 23rd as out pt by Dr Marius Ditch -Serology w/u for PBC, AIH and IgG4 negative so far  2. Acute pancreatitis secondary to biliary obstruction.Resolved  3. Hypokalemia,  improved with the supplement.  4. Mood disorder on lamotrigine and olanzapine  5. Itching from hyperbilirubinemia. -Continue Atarax still symptomatic -Continue ursodiol   Spoke with GI doctor going up okay for patient to go home. Discussed with patient regarding going home.   CONSULTS OBTAINED:  Treatment Team:  Lin Landsman, MD  DRUG ALLERGIES:  No Known Allergies  DISCHARGE MEDICATIONS:   Current Discharge Medication List    START taking these medications   Details  ciprofloxacin (CIPRO) 500 MG tablet Take 1 tablet (500 mg total) by mouth 2 (two) times daily. Qty: 10 tablet, Refills: 0    diphenhydrAMINE (BENADRYL) 50 MG capsule Take 1 capsule (50 mg total) by mouth at bedtime as needed for itching or sleep. Qty: 30 capsule, Refills: 0    ursodiol (ACTIGALL) 300 MG capsule Take 1 capsule (300 mg total) by mouth 3 (three) times daily. Qty: 30 capsule, Refills: 1      CONTINUE these medications which have CHANGED   Details  hydrOXYzine (ATARAX/VISTARIL) 50 MG tablet Take 1 tablet (50 mg total) by mouth 3 (three) times daily. Qty: 30 tablet, Refills: 0      CONTINUE these medications which have NOT CHANGED   Details  lamoTRIgine (LAMICTAL) 25 MG tablet Take 25 mg by mouth at bedtime. Two tabs at bedtime.    OLANZapine (ZYPREXA) 10 MG tablet Take 10 mg by mouth at bedtime.      STOP taking these medications     ondansetron (ZOFRAN) 4 MG tablet         If you experience worsening of your admission symptoms, develop  shortness of breath, life threatening emergency, suicidal or homicidal thoughts you must seek medical attention immediately by calling 911 or calling your MD immediately  if symptoms less severe.  You Must read complete instructions/literature along with all the possible adverse reactions/side effects for all the Medicines you take and that have been prescribed to you. Take any new Medicines after you have completely understood and  accept all the possible adverse reactions/side effects.   Please note  You were cared for by a hospitalist during your hospital stay. If you have any questions about your discharge medications or the care you received while you were in the hospital after you are discharged, you can call the unit and asked to speak with the hospitalist on call if the hospitalist that took care of you is not available. Once you are discharged, your primary care physician will handle any further medical issues. Please note that NO REFILLS for any discharge medications will be authorized once you are discharged, as it is imperative that you return to your primary care physician (or establish a relationship with a primary care physician if you do not have one) for your aftercare needs so that they can reassess your need for medications and monitor your lab values.  DATA REVIEW:   CBC  Recent Labs  Lab 03/14/17 0610  WBC 4.7  HGB 13.4  HCT 39.2*  PLT 211    Chemistries  Recent Labs  Lab 03/16/17 0449  03/20/17 0610 03/20/17 0813  NA 135   < > 135  --   K 4.2   < > 4.1  --   CL 104   < > 100*  --   CO2 22   < > 22  --   GLUCOSE 92   < > 126*  --   BUN 9   < > 13  --   CREATININE 1.01   < > 0.81  --   CALCIUM 9.3   < > 10.0  --   MG 2.0  --   --   --   AST 55*   < > 56*  --   ALT 98*   < > 75*  --   ALKPHOS 298*   < > 291*  --   BILITOT 7.8*   < > 8.7* 8.9*   < > = values in this interval not displayed.    Microbiology Results   Recent Results (from the past 240 hour(s))  Blood culture (routine x 2)     Status: None   Collection Time: 03/13/17  6:45 PM  Result Value Ref Range Status   Specimen Description BLOOD LEFT AC  Final   Special Requests   Final    BOTTLES DRAWN AEROBIC AND ANAEROBIC Blood Culture results may not be optimal due to an excessive volume of blood received in culture bottles   Culture NO GROWTH 5 DAYS  Final   Report Status 03/18/2017 FINAL  Final  Blood culture (routine  x 2)     Status: None   Collection Time: 03/13/17  6:45 PM  Result Value Ref Range Status   Specimen Description BLOOD LEFT FA  Final   Special Requests   Final    BOTTLES DRAWN AEROBIC AND ANAEROBIC Blood Culture adequate volume   Culture NO GROWTH 5 DAYS  Final   Report Status 03/18/2017 FINAL  Final    RADIOLOGY:  Dg C-arm 1-60 Min-no Report  Result Date: 03/19/2017 Fluoroscopy was utilized by the requesting physician.  No radiographic interpretation.     Management plans discussed with the patient, family and they are in agreement.  CODE STATUS:     Code Status Orders  (From admission, onward)        Start     Ordered   03/13/17 1900  Full code  Continuous     03/13/17 1900    Code Status History    Date Active Date Inactive Code Status Order ID Comments User Context   03/09/2017 04:48 03/12/2017 17:16 Full Code [id number]  Saundra Shelling, MD Inpatient      TOTAL TIME TAKING CARE OF THIS PATIENT: *40* minutes.    Fritzi Mandes M.D on 03/20/2017 at 2:42 PM  Between 7am to 6pm - Pager - [phone] After 6pm go to [email or web address] - password [email or web address]  Office  [phone]  CC: Primary care physician; Patient, No Pcp Per

## 2017-03-20 NOTE — Anesthesia Postprocedure Evaluation (Signed)
Anesthesia Post Note  Patient: Travis Hawkins  Procedure(s) Performed: ENDOSCOPIC RETROGRADE CHOLANGIOPANCREATOGRAPHY (ERCP) (N/A )  Patient location during evaluation: PACU Anesthesia Type: General Level of consciousness: awake and alert Pain management: pain level controlled Vital Signs Assessment: post-procedure vital signs reviewed and stable Respiratory status: spontaneous breathing, nonlabored ventilation, respiratory function stable and patient connected to nasal cannula oxygen Cardiovascular status: blood pressure returned to baseline and stable Postop Assessment: no apparent nausea or vomiting Anesthetic complications: no     Last Vitals:  Vitals:   03/19/17 2031 03/20/17 0450  BP: 123/66 118/61  Pulse: (!) 59 63  Resp: 18   Temp: 36.5 C 36.6 C  SpO2: 100% 100%    Last Pain:  Vitals:   03/20/17 0450  TempSrc: Oral  PainSc:                  [name]

## 2017-03-20 NOTE — Progress Notes (Signed)
Pt discharged per MD order. IV removed. New prescriptions reviewed with pt. Discharge instructions reviewed with pt. Questions answered to pt satisfaction. Pt taken to car in wheelchair by staff.

## 2017-03-20 NOTE — Discharge Instructions (Signed)
Liver biopsy 11/23, come to Registration Desk in [place] by 10 a.m. on Friday.  Please don't eat or drink anything after midnight on Thursday night.  Do not take any blood thinners (aspirin, ibuprofen, etc) prior to the procedure.  Make sure and bring a driver with you to the procedure as you will not be able to drive afterward.

## 2017-03-20 NOTE — Progress Notes (Signed)
Patient was seen by GI Dr. Marius Ditch and she has made arrangements for liver biopsy to be done on Friday, November 23.  I spoke with patient and he is okay to go home and get a liver biopsy as outpatient. Will be discharged today.

## 2017-03-20 NOTE — Plan of Care (Signed)
Patient is awaiting GI consult for procedure on gallbladder.  Travis Hawkins

## 2017-03-21 LAB — IGG, IGA, IGM
IgA: 174 mg/dL (ref 90–386)
IgG (Immunoglobin G), Serum: 1563 mg/dL (ref 700–1600)
IgM (Immunoglobulin M), Srm: 125 mg/dL (ref 20–172)

## 2017-03-22 ENCOUNTER — Other Ambulatory Visit: Payer: Self-pay | Admitting: Gastroenterology

## 2017-03-22 ENCOUNTER — Ambulatory Visit
Admission: RE | Admit: 2017-03-22 | Discharge: 2017-03-22 | Disposition: A | Discharge status: Home / Self Care | Payer: PRIVATE HEALTH INSURANCE | Source: Ambulatory Visit | Attending: Gastroenterology | Admitting: Gastroenterology

## 2017-03-22 DIAGNOSIS — K831 Obstruction of bile duct: Secondary | ICD-10-CM

## 2017-03-22 DIAGNOSIS — K8051 Calculus of bile duct without cholangitis or cholecystitis with obstruction: Secondary | ICD-10-CM | POA: Insufficient documentation

## 2017-03-22 MED ORDER — LIDOCAINE HCL (PF) 1 % IJ SOLN
INTRAMUSCULAR | Status: AC | PRN
  Administered 2017-03-22: 5 mL

## 2017-03-22 MED ORDER — MIDAZOLAM HCL 5 MG/5ML IJ SOLN
INTRAMUSCULAR | Status: AC | PRN
  Administered 2017-03-22 (×2): 1 mg via INTRAVENOUS
  Administered 2017-03-22: 0.5 mg via INTRAVENOUS

## 2017-03-22 MED ORDER — SODIUM CHLORIDE 0.9 % IV SOLN
INTRAVENOUS | Status: DC
  Administered 2017-03-22: 11:00:00 via INTRAVENOUS

## 2017-03-22 MED ORDER — LAMOTRIGINE 25 MG PO TABS: 25.0000 mg | ORAL_TABLET | Freq: Two times a day (BID) | ORAL | 0 refills | Status: DC

## 2017-03-22 MED ORDER — FENTANYL CITRATE (PF) 100 MCG/2ML IJ SOLN
INTRAMUSCULAR | Status: AC | PRN
  Administered 2017-03-22: 50 ug via INTRAVENOUS
  Administered 2017-03-22: 25 ug via INTRAVENOUS

## 2017-03-22 MED ORDER — OXYCODONE HCL 5 MG PO TABS
5.0000 mg | ORAL_TABLET | Freq: Once | ORAL | Status: AC
  Administered 2017-03-22: 5 mg via ORAL

## 2017-03-22 MED ORDER — FENTANYL CITRATE (PF) 100 MCG/2ML IJ SOLN
50.0000 ug | Freq: Once | INTRAMUSCULAR | Status: AC
  Administered 2017-03-22: 50 ug via INTRAVENOUS

## 2017-03-22 NOTE — Procedures (Signed)
Pre Procedure Dx: Elevated LFTs Post Procedural Dx: Same  Technically successful US guided biopsy of right lobe of the liver.  EBL: None  No immediate complications.   Jay Sabastien Tyler, MD Pager #: [phone]    

## 2017-03-22 NOTE — Consult Note (Signed)
Chief Complaint: Elevated LFTs  Referring Physician(s): Lin Landsman  Patient Status: [facility] - Out-pt  History of Present Illness: Travis Hawkins is a 41 y.o. male with past medical history significant for depression and mood disorder who was in his baseline state of well health until recently when he presented to the hospital with yellowing of the skin and eyes as well as diffuse itching. Subsequent workup demonstrated mild intrahepatic ductal dilatation for which the patient underwent an ERCP and internal biliary stent placement. Unfortunately, the patient's LFTs have remained persistently elevated and as such, the patient presents today for image guided liver biopsy. The patient is accompanied by his fiance though serves as his own historian.  The patient continues to complain of yellowing of the skin and eyes as well as diffuse itching. He reports constipation dark urine. He has a report subjective fever and chills.  He denies chest pain and shortness of breath.  Past Medical History:  Diagnosis Date  . Biliary obstruction   . Depression   . Mood disorder [facility])     Past Surgical History:  Procedure Laterality Date  . ENDOSCOPIC RETROGRADE CHOLANGIOPANCREATOGRAPHY (ERCP) WITH PROPOFOL N/A 03/11/2017   Procedure: ENDOSCOPIC RETROGRADE CHOLANGIOPANCREATOGRAPHY (ERCP) WITH PROPOFOL;  Surgeon: Lucilla Lame, MD;  Location: [facility] ENDOSCOPY;  Service: Endoscopy;  Laterality: N/A;  . ERCP N/A 03/19/2017   Procedure: ENDOSCOPIC RETROGRADE CHOLANGIOPANCREATOGRAPHY (ERCP);  Surgeon: Lucilla Lame, MD;  Location: [facility] ENDOSCOPY;  Service: Endoscopy;  Laterality: N/A;  . none      Allergies: Patient has no known allergies.  Medications: Prior to Admission medications   Medication Sig Start Date End Date Taking? Authorizing Provider  ciprofloxacin (CIPRO) 500 MG tablet Take 1 tablet (500 mg total) by mouth 2 (two) times daily. 03/20/17  Yes Fritzi Mandes, MD  hydrOXYzine  (ATARAX/VISTARIL) 50 MG tablet Take 1 tablet (50 mg total) by mouth 3 (three) times daily. 03/20/17  Yes Fritzi Mandes, MD  OLANZapine (ZYPREXA) 10 MG tablet Take 10 mg by mouth at bedtime.   Yes [provider]  ursodiol (ACTIGALL) 300 MG capsule Take 1 capsule (300 mg total) by mouth 3 (three) times daily. 03/20/17  Yes Fritzi Mandes, MD  diphenhydrAMINE (BENADRYL) 50 MG capsule Take 1 capsule (50 mg total) by mouth at bedtime as needed for itching or sleep. 03/20/17   Fritzi Mandes, MD  lamoTRIgine (LAMICTAL) 25 MG tablet Take 25 mg by mouth at bedtime. Two tabs at bedtime.    [provider]     Family History  Problem Relation Age of Onset  . Cancer Maternal Aunt   . Breast cancer Maternal Grandmother   . Lung cancer Maternal Grandfather   . Diabetes Mellitus II Mother     Social History   Socioeconomic History  . Marital status: Divorced    Spouse name: None  . Number of children: None  . Years of education: None  . Highest education level: None  Social Needs  . Financial resource strain: None  . Food insecurity - worry: None  . Food insecurity - inability: None  . Transportation needs - medical: None  . Transportation needs - non-medical: None  Occupational History  . None  Tobacco Use  . Smoking status: Current Every Day Smoker    Packs/day: 1.00    Years: 18.00    Pack years: 18.00    Types: E-cigarettes  . Smokeless tobacco: Never Used  Substance and Sexual Activity  . Alcohol use: Yes    Comment: Occasional  wine about every 3-4 months  . Drug use: No  . Sexual activity: Yes  Other Topics Concern  . None  Social History Narrative  . None    ECOG Status: 1 - Symptomatic but completely ambulatory  Review of Systems: A 12 point ROS discussed and pertinent positives are indicated in the HPI above.  All other systems are negative.  Review of Systems  Constitutional: Positive for appetite change, chills, fatigue and fever.  HENT:        Yellowing of the eyes.  Respiratory: Negative.   Cardiovascular: Negative.   Gastrointestinal: Positive for constipation.  Genitourinary:       Dark urine.  Skin: Positive for color change.    Vital Signs: BP 117/77   Pulse 72   Temp 98.1 F (36.7 C) (Oral)   Resp 18   Ht 5\' 8"  (1.727 m)   Wt 194 lb (88 kg)   SpO2 100%   BMI 29.50 kg/m   Physical Exam  Constitutional: He appears well-developed and well-nourished.  HENT:  Head: Normocephalic and atraumatic.  Eyes: Scleral icterus is present.  Cardiovascular: Normal rate and regular rhythm.  Pulmonary/Chest: Effort normal and breath sounds normal.  Abdominal: Soft. Bowel sounds are normal.  Skin: Skin is warm and dry.  Nursing note and vitals reviewed.   Imaging: Ct Abdomen Pelvis W Contrast  Result Date: 03/13/2017 CLINICAL DATA:  Abdominal pain, nausea and vomiting increasing in the last week. Status post ERCP with stent placement on 03/11/2017 and biliary stone removal. EXAM: CT ABDOMEN AND PELVIS WITH CONTRAST TECHNIQUE: Multidetector CT imaging of the abdomen and pelvis was performed using the standard protocol following bolus administration of intravenous contrast. CONTRAST:  172mL ISOVUE-300 IOPAMIDOL (ISOVUE-300) INJECTION 61% COMPARISON:  MRI of the abdomen 03/09/2017 FINDINGS: Lower chest: No acute abnormality. Hepatobiliary: Normal appearance of the liver. Gallbladder wall edema measuring 5 mm. Small amount of pericholecystic fluid. Tiny foci of gas within the gallbladder lumen. Common bile duct stent terminates in the second portion of the duodenum. No evidence of biliary ductal dilation or pancreatic duct dilation. Pancreas: Unremarkable. No pancreatic ductal dilatation or surrounding inflammatory changes. Spleen: Normal in size without focal abnormality. Adrenals/Urinary Tract: Adrenal glands are unremarkable. Kidneys are normal, without renal calculi, focal lesion, or hydronephrosis. Bladder is unremarkable.  Stomach/Bowel: Stomach is within normal limits. Appendix appears normal. No evidence of bowel wall thickening, distention, or inflammatory changes. Vascular/Lymphatic: No significant vascular findings are present. No enlarged abdominal or pelvic lymph nodes. Reproductive: Prostate is unremarkable. Other: Periduodenal and periportal inflammatory changes. Musculoskeletal: No acute or significant osseous findings. IMPRESSION: Common bile duct stent in appropriate position. Edematous gallbladder wall with small amount of pericholecystic fluid and inflammatory changes in porta hepatitis and periduodenal locations. Findings are most concerning for acalculus cholecystitis/ cholangitis. Electronically Signed   By: Fidela Salisbury M.D.   On: 03/13/2017 18:00   Ct Abdomen Pelvis W Contrast  Result Date: 03/09/2017 CLINICAL DATA:  Chronic generalized abdominal pain and nausea. Lightheadedness and generalized weakness. Itchiness. EXAM: CT ABDOMEN AND PELVIS WITH CONTRAST TECHNIQUE: Multidetector CT imaging of the abdomen and pelvis was performed using the standard protocol following bolus administration of intravenous contrast. CONTRAST:  158mL ISOVUE-300 IOPAMIDOL (ISOVUE-300) INJECTION 61% COMPARISON:  Right upper quadrant ultrasound performed earlier today at 1:25 a.m. FINDINGS: Lower chest: The visualized lung bases are grossly clear. The visualized portions of the mediastinum are unremarkable. Hepatobiliary: There is dilatation of the common bile duct to 1.5 cm in diameter, to the  level of the pancreatic head. This may reflect an underlying obstructing stone, though mass cannot be excluded. There is diffuse prominence of the intrahepatic biliary ducts. The gallbladder is somewhat distended but otherwise grossly unremarkable. The liver is otherwise unremarkable in appearance. Pancreas: The pancreas is otherwise grossly unremarkable. Spleen: The spleen is unremarkable in appearance. Adrenals/Urinary Tract: The  adrenal glands are unremarkable in appearance. The kidneys are within normal limits. There is no evidence of hydronephrosis. No renal or ureteral stones are identified. No perinephric stranding is seen. Stomach/Bowel: The stomach is unremarkable in appearance. The small bowel is within normal limits. The appendix is normal in caliber, without evidence of appendicitis. The colon is unremarkable in appearance. Vascular/Lymphatic: The abdominal aorta is unremarkable in appearance. The inferior vena cava is grossly unremarkable. No retroperitoneal lymphadenopathy is seen. No pelvic sidewall lymphadenopathy is identified. Reproductive: The bladder is mildly distended and grossly unremarkable. The prostate is mildly enlarged, measuring 5.1 cm in transverse dimension. Other: No additional soft tissue abnormalities are seen. Musculoskeletal: No acute osseous abnormalities are identified. The visualized musculature is unremarkable in appearance. IMPRESSION: 1. Dilatation of the common bile duct to 1.5 cm in diameter, to the level of the pancreatic head. Diffuse prominence of the intrahepatic biliary ducts and mild gallbladder distention. This is concerning for underlying obstructing stone, though underlying mass cannot be excluded. ERCP or MRCP is recommended for further evaluation. 2. Mildly enlarged prostate. Electronically Signed   By: Garald Balding M.D.   On: 03/09/2017 03:00   Mr 3d Recon At Scanner  Result Date: 03/09/2017 CLINICAL DATA:  Biliary dilatation on recent CT scan. EXAM: MRI ABDOMEN WITHOUT AND WITH CONTRAST (INCLUDING MRCP) TECHNIQUE: Multiplanar multisequence MR imaging of the abdomen was performed both before and after the administration of intravenous contrast. Heavily T2-weighted images of the biliary and pancreatic ducts were obtained, and three-dimensional MRCP images were rendered by post processing. CONTRAST:  18 cc MultiHance COMPARISON:  CT scan 03/09/2018 FINDINGS: Lower chest: The lung  bases are grossly clear. No pleural or pericardial effusion. Hepatobiliary: As demonstrated on the CT scan there is moderate intra and extrahepatic biliary dilatation with the common bile duct measuring up to 13.5 mm in the head of the pancreas. No obstructing common bile duct stones are identified. There is some layering debris dependently in the common bile duct which demonstrates high T1 signal intensity and is likely cholesterol laden bile or sludge. No hepatic lesions. No ampullary lesion or pancreatic head mass. The cystic duct is also mildly dilated as is the gallbladder. No definite gallstones or findings for acute cholecystitis. Pancreas: No mass, inflammation or ductal dilatation. Normal caliber and course of the main pancreatic duct. Spleen:  Normal size.  No focal lesions. Adrenals/Urinary Tract:  The adrenal glands and kidneys are normal. Stomach/Bowel: Visualized portions within the abdomen are unremarkable. Vascular/Lymphatic: No pathologically enlarged lymph nodes identified. No abdominal aortic aneurysm demonstrated. Other:  No ascites or abdominal wall hernia. Musculoskeletal: No significant bony findings. IMPRESSION: 1. Intra and extrahepatic biliary dilatation without identifiable cause. Suspect common bile duct stricture. No definite common bile duct stones and no ampullary lesion or pancreatic head mass. Recommend ERCP for further evaluation and treatment. 2. Normal caliber and course of the main pancreatic duct. 3. No other significant abdominal findings. Electronically Signed   By: Marijo Sanes M.D.   On: 03/09/2017 10:42   Dg C-arm 1-60 Min-no Report  Result Date: 03/19/2017 Fluoroscopy was utilized by the requesting physician.  No radiographic interpretation.  Dg C-arm 1-60 Min-no Report  Result Date: 03/14/2017 Fluoroscopy was utilized by the requesting physician.  No radiographic interpretation.   Mr Abdomen Mrcp W Wo Contast  Result Date: 03/09/2017 CLINICAL DATA:   Biliary dilatation on recent CT scan. EXAM: MRI ABDOMEN WITHOUT AND WITH CONTRAST (INCLUDING MRCP) TECHNIQUE: Multiplanar multisequence MR imaging of the abdomen was performed both before and after the administration of intravenous contrast. Heavily T2-weighted images of the biliary and pancreatic ducts were obtained, and three-dimensional MRCP images were rendered by post processing. CONTRAST:  18 cc MultiHance COMPARISON:  CT scan 03/09/2018 FINDINGS: Lower chest: The lung bases are grossly clear. No pleural or pericardial effusion. Hepatobiliary: As demonstrated on the CT scan there is moderate intra and extrahepatic biliary dilatation with the common bile duct measuring up to 13.5 mm in the head of the pancreas. No obstructing common bile duct stones are identified. There is some layering debris dependently in the common bile duct which demonstrates high T1 signal intensity and is likely cholesterol laden bile or sludge. No hepatic lesions. No ampullary lesion or pancreatic head mass. The cystic duct is also mildly dilated as is the gallbladder. No definite gallstones or findings for acute cholecystitis. Pancreas: No mass, inflammation or ductal dilatation. Normal caliber and course of the main pancreatic duct. Spleen:  Normal size.  No focal lesions. Adrenals/Urinary Tract:  The adrenal glands and kidneys are normal. Stomach/Bowel: Visualized portions within the abdomen are unremarkable. Vascular/Lymphatic: No pathologically enlarged lymph nodes identified. No abdominal aortic aneurysm demonstrated. Other:  No ascites or abdominal wall hernia. Musculoskeletal: No significant bony findings. IMPRESSION: 1. Intra and extrahepatic biliary dilatation without identifiable cause. Suspect common bile duct stricture. No definite common bile duct stones and no ampullary lesion or pancreatic head mass. Recommend ERCP for further evaluation and treatment. 2. Normal caliber and course of the main pancreatic duct. 3. No  other significant abdominal findings. Electronically Signed   By: Marijo Sanes M.D.   On: 03/09/2017 10:42   US Abdomen Limited Ruq  Result Date: 03/09/2017 CLINICAL DATA:  Abdomen pain with elevated bilirubin EXAM: ULTRASOUND ABDOMEN LIMITED RIGHT UPPER QUADRANT COMPARISON:  None. FINDINGS: Gallbladder: Enlarged gallbladder. No shadowing stones or sludge. Negative sonographic Murphy. Upper normal wall thickness at 2.9 mm Common bile duct: Diameter: Enlarged measuring up to 11 mm Liver: Marked intra hepatic biliary dilatation. Coarse increased echogenicity. Portal vein is patent on color Doppler imaging with normal direction of blood flow towards the liver. IMPRESSION: 1. No shadowing stones. 2. Dilated gallbladder. Marked intra and extra hepatic biliary dilatation ; CT abdomen and pelvis could be obtained if concern for obstructing lesion at the pancreas 3. Coarse increased liver echogenicity suggesting Please pick the correct US ABDOMEN LIMITED template. Electronically Signed   By: Donavan Foil M.D.   On: 03/09/2017 02:03    Labs:  CBC: Recent Labs    03/08/17 2233 03/09/17 0516 03/13/17 1506 03/14/17 0610  WBC 6.5 5.6 7.1 4.7  HGB 13.9 13.8 14.2 13.4  HCT 41.5 40.5 42.0 39.2*  PLT 220 212 240 211    COAGS: Recent Labs    03/09/17 0106 03/13/17 1845  INR 1.03 0.97  APTT 31  --     BMP: Recent Labs    03/16/17 0449 03/17/17 0450 03/18/17 0807 03/20/17 0610  NA 135 134* 134* 135  K 4.2 4.1 4.1 4.1  CL 104 103 102 100*  CO2 22 22 22 22   GLUCOSE 92 99 116* 126*  BUN 9 12  12 13  CALCIUM 9.3 9.3 9.6 10.0  CREATININE 1.01 0.83 0.73 0.81  GFRNONAA >60 >60 >60 >60  GFRAA >60 >60 >60 >60    LIVER FUNCTION TESTS: Recent Labs    03/17/17 0450 03/18/17 0807 03/19/17 0532 03/20/17 0610 03/20/17 0813  BILITOT 7.8* 7.4* 8.0* 8.7* 8.9*  AST 53* 53* 59* 56*  --   ALT 88* 77* 79* 75*  --   ALKPHOS 299* 270* 313* 291*  --   PROT 7.1 7.3 8.3* 8.0  --   ALBUMIN 3.6 3.5  3.9 4.1  --     TUMOR MARKERS: No results for input(s): AFPTM, CEA, CA199, CHROMGRNA in the last 8760 hours.  Assessment and Plan:  Travis Hawkins is a 41 y.o. male with past medical history significant for depression and mood disorder who was in his baseline state of well health until recently when he was presented to the hospital with yellowing of the skin and eyes as well as diffuse itching. Subsequent workup demonstrated mild intrahepatic ductal dilatation for which the patient underwent an ERCP and internal biliary stent placement. Unfortunately, the patient's LFTs have remained persistently elevated and as such, the patient presents today for image guided liver biopsy.   The patient continues to complain of yellowing of the skin and eyes, diffuse itching, constipation and dark urine.   Risks and benefits of ultrasound guided liver biopsy were discussed with the patient including, but not limited to bleeding, infection, damage to adjacent structures or low yield requiring additional tests.  All of the patient's questions were answered, patient is agreeable to proceed.  Consent signed and in chart.  Note, patient is concerned that recent medication change from brand formula to generic version of Lamictal, may be contributing to his liver function abnormalities. This was discussed with referring gastroenterologist, Dr. Marius Ditch, and she will write a prescription for him to switch back to the brand version.  Thank you for this interesting consult.  I greatly enjoyed meeting Travis Hawkins and look forward to participating in their care.  A copy of this report was sent to the requesting provider on this date.  Electronically Signed: Sandi Mariscal, MD 03/22/2017, 10:54 AM   I spent a total of 15 Minutes in face to face in clinical consultation, greater than 50% of which was counseling/coordinating care for US guided random liver biopsyl

## 2017-03-24 ENCOUNTER — Telehealth: Payer: Self-pay | Admitting: Gastroenterology

## 2017-03-24 DIAGNOSIS — K7589 Other specified inflammatory liver diseases: Secondary | ICD-10-CM

## 2017-03-24 MED ORDER — PSYLLIUM 28 % PO PACK: 1.0000 | PACK | Freq: Two times a day (BID) | ORAL | 0 refills | Status: DC

## 2017-03-24 MED ORDER — COLESTIPOL HCL 5 G PO GRAN: 5.0000 g | GRANULES | Freq: Two times a day (BID) | ORAL | 0 refills | Status: DC

## 2017-03-24 MED ORDER — MAGNESIUM CITRATE PO SOLN: 1.0000 | Freq: Once | ORAL | 0 refills | Status: AC

## 2017-03-24 NOTE — Telephone Encounter (Signed)
Called to f/u on Pt. He continues to have severe itching. And, severe constipation. Last BM while in hospital 1 week ago.   Will stop Urso, switch to colestipol 5gm BID, continue atarax and benadryl. He started miralax 1cup daily, told to increase to BID, add psyllium BID, and a bottle of mag citrate. Sent prescription to [place], [address], [employer]  Asked him to call my office if he has any concerns  Cephas Darby, MD [address]  [place]  [place], [place] [address]  Main: [phone]  Fax: [phone] Pager: [phone]

## 2017-03-24 NOTE — Telephone Encounter (Signed)
His pharmacy does not carry colestid.

## 2017-03-24 NOTE — Telephone Encounter (Signed)
His pharmacy does not carry Colestid. Called in for cholestyramine 4gm 2-3 times daily. He will also stop cipro.  Cephas Darby, MD [address]  [place]  [place], [place] [address]  Main: [phone]  Fax: [phone] Pager: [phone]

## 2017-03-25 LAB — CYTOLOGY - NON PAP

## 2017-03-25 LAB — PAN-ANCA
C-ANCA: <1:20 {titer}
Myeloperoxidase Abs: <9 U/mL (ref 0.0–9.0)

## 2017-03-25 LAB — SURGICAL PATHOLOGY

## 2017-03-27 ENCOUNTER — Telehealth: Payer: Self-pay | Admitting: Gastroenterology

## 2017-03-27 ENCOUNTER — Encounter: Payer: Self-pay | Admitting: Gastroenterology

## 2017-03-27 ENCOUNTER — Other Ambulatory Visit: Payer: Self-pay | Admitting: Gastroenterology

## 2017-03-27 ENCOUNTER — Other Ambulatory Visit: Payer: Self-pay

## 2017-03-27 DIAGNOSIS — K7589 Other specified inflammatory liver diseases: Secondary | ICD-10-CM

## 2017-03-27 NOTE — Telephone Encounter (Signed)
Please call patients girlfriend, Delores, regarding a phone call. [phone]

## 2017-03-28 ENCOUNTER — Other Ambulatory Visit
Admission: RE | Admit: 2017-03-28 | Discharge: 2017-03-28 | Disposition: A | Discharge status: Home / Self Care | Payer: PRIVATE HEALTH INSURANCE | Source: Ambulatory Visit | Attending: Gastroenterology | Admitting: Gastroenterology

## 2017-03-28 DIAGNOSIS — K7589 Other specified inflammatory liver diseases: Secondary | ICD-10-CM | POA: Insufficient documentation

## 2017-03-28 LAB — HEPATIC FUNCTION PANEL
ALT: 82 U/L — ABNORMAL HIGH (ref 17–63)
AST: 72 U/L — AB (ref 15–41)
Albumin: 4.1 g/dL (ref 3.5–5.0)
Alkaline Phosphatase: 246 U/L — ABNORMAL HIGH (ref 38–126)
BILIRUBIN DIRECT: 3.7 mg/dL — AB (ref 0.1–0.5)
BILIRUBIN TOTAL: 7 mg/dL — AB (ref 0.3–1.2)
Indirect Bilirubin: 3.3 mg/dL — ABNORMAL HIGH (ref 0.3–0.9)
Total Protein: 8.4 g/dL — ABNORMAL HIGH (ref 6.5–8.1)

## 2017-03-28 LAB — BASIC METABOLIC PANEL
Anion gap: 11 (ref 5–15)
BUN: 15 mg/dL (ref 6–20)
CHLORIDE: 99 mmol/L — AB (ref 101–111)
CO2: 24 mmol/L (ref 22–32)
CREATININE: 0.83 mg/dL (ref 0.61–1.24)
Calcium: 9.8 mg/dL (ref 8.9–10.3)
GFR calc Af Amer: >60 mL/min (ref >60)
GFR calc non Af Amer: >60 mL/min (ref >60)
GLUCOSE: 107 mg/dL — AB (ref 65–99)
Potassium: 4.1 mmol/L (ref 3.5–5.1)
SODIUM: 134 mmol/L — AB (ref 135–145)

## 2017-03-28 LAB — CBC
HCT: 41 % (ref 40.0–52.0)
HEMOGLOBIN: 14.1 g/dL (ref 13.0–18.0)
MCH: 32.5 pg (ref 26.0–34.0)
MCHC: 34.5 g/dL (ref 32.0–36.0)
MCV: 94.3 fL (ref 80.0–100.0)
Platelets: 325 10*3/uL (ref 150–440)
RBC: 4.34 MIL/uL — ABNORMAL LOW (ref 4.40–5.90)
RDW: 14.1 % (ref 11.5–14.5)
WBC: 8.6 10*3/uL (ref 3.8–10.6)

## 2017-03-28 LAB — PROTIME-INR
INR: 0.98
PROTHROMBIN TIME: 12.9 s (ref 11.4–15.2)

## 2017-03-28 LAB — PHOSPHORUS: PHOSPHORUS: 3.9 mg/dL (ref 2.5–4.6)

## 2017-03-28 LAB — MAGNESIUM: Magnesium: 1.9 mg/dL (ref 1.7–2.4)

## 2017-03-29 ENCOUNTER — Telehealth: Payer: Self-pay

## 2017-03-29 ENCOUNTER — Other Ambulatory Visit: Payer: Self-pay | Admitting: Gastroenterology

## 2017-03-29 ENCOUNTER — Encounter: Payer: Self-pay | Admitting: Gastroenterology

## 2017-03-29 ENCOUNTER — Other Ambulatory Visit: Payer: Self-pay

## 2017-03-29 ENCOUNTER — Ambulatory Visit: Payer: PRIVATE HEALTH INSURANCE | Admitting: Gastroenterology

## 2017-03-29 VITALS — BP 105/69 | HR 78 | Temp 97.8°F | Ht 68.0 in | Wt 187.2 lb

## 2017-03-29 DIAGNOSIS — K831 Obstruction of bile duct: Secondary | ICD-10-CM

## 2017-03-29 DIAGNOSIS — R945 Abnormal results of liver function studies: Secondary | ICD-10-CM

## 2017-03-29 DIAGNOSIS — K7589 Other specified inflammatory liver diseases: Secondary | ICD-10-CM

## 2017-03-29 DIAGNOSIS — R7989 Other specified abnormal findings of blood chemistry: Secondary | ICD-10-CM

## 2017-03-29 MED ORDER — ONDANSETRON HCL 4 MG PO TABS: 4.0000 mg | ORAL_TABLET | Freq: Three times a day (TID) | ORAL | 1 refills | Status: DC | PRN

## 2017-03-29 MED ORDER — COLESTIPOL HCL 5 G PO GRAN: 5.0000 g | GRANULES | Freq: Four times a day (QID) | ORAL | 0 refills | Status: DC

## 2017-03-29 MED ORDER — SERTRALINE HCL 50 MG PO TABS: 75.0000 mg | ORAL_TABLET | Freq: Every day | ORAL | 0 refills | Status: DC

## 2017-03-29 NOTE — Progress Notes (Addendum)
Travis Darby, MD [address]  [place]  [place], [place] [address]  Main: [phone]  Fax: [phone]    Gastroenterology Consultation  Referring Provider:     No ref. provider found Primary Care Physician:  Patient, No Pcp Per Primary Gastroenterologist:  Dr. Cephas Hawkins Reason for Consultation:   Cholestatic jaundice        HPI:   Travis Hawkins is a 41 y.o. 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 He underwent ERCP with biliary sphincterotomy and CBD stent placement on 03/11/17. He also had mild acute pancreatitis. However, his Bilirubin did not improve after biliary decompression and was discharged home on 03/12/17. His symptoms persisted and was readmitted on 03/13/17. His LFTs are significantly elevated including TBili/DBili and AP. Surgery and GI were consulted. He had repeat CT abdomen during second admission on 03/13/17 which revealed CBD stent in place and no biliary dilation. He was started on antibiotics. He was evaluated by surgery again and did not recommend cholecystectomy. He underwent repeat ERCP by Dr Allen Norris on 03/19/17 with stent exchange and free flow of bile with no stent obstruction. Given persistent cholestasis, he was further evaluated for intrinsic liver injury including AIH, PBC, PSC, viral hepatitis, HIV, IgG4, immunoglobulins, p-ANCA and liver biopsy. His secondary liver disease work up came back unremarkable. He was discharged home on 03/20/17 on cipro, atarax, urso. His liver biopsy which was performed after discharge revealed MODERATE CHOLESTASIS WITH FOCAL MILD BILE DUCTULAR REACTION,  findings compatible with partial bile duct outflow obstruction and few eosinophils which are possibly due to cholestatic drug reaction.   Interval  history 03/29/17:  Since discharge, I have been in touch with pt and his fiance via phone regularly as he continued to have intense itching, severe constipation and flare up of hemorrhoids. I stopped cipro, swithced from urso to cholestyramine, started him on magnesium citrate, psyllium, mirarlax, fleet enemas and he finally had a BM. He is dealing with flare up of hemorrhoids. He had repeat Labs yesterday which showed improvement in LFTs and he also felt itching is gradually getting better after switching to cholestyramine.   Today, he reports that he continues to feel nauseous, decreased appetite, altered taste, altered smell, lost 20lbs in 1 month, and ongoing pruritus. He is frustrated with ongoing symptoms, weight loss, unable to go back to work. He wants to know when his gallbladder is going to come out.   Apparently, pt said he has been dealing with symptoms for almost a month prior to his initial admission as he was waiting on his insurance. 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 He denies having BM in last 3days. He denies abdominal pain, f/c/ recent abx, NSAID use He is a smoker, denies illicit drug use, ETOH. No new medications He denies taking OTC/herbal supplements   NSAIDs: none  Antiplts/Anticoagulants/Anti thrombotics: none  GI Procedures: ERCP 03/11/17 - The major papilla appeared to be bulging. - The common bile duct was moderately dilated, acquired. - Choledocholithiasis was found. Complete removal was accomplished by biliary sphincterotomy and balloon extraction. - A biliary sphincterotomy was performed. - The biliary tree was swept. - Drainage of bile was slow and would not drain. - One plastic stent  was placed into the common bile duct.  ERCP  03/19/17 - One stent was removed from the biliary tree. - The biliary tree was swept and nothing was found. - One plastic stent was placed into the  common bile duct. - The previously placed stent appeared to be draining well. Cytology: DIAGNOSIS:  A. BILE DUCT BRUSHING; ERCP:  - ATYPICAL, FAVOR REACTIVE, SEE COMMENT.   US guided liver biopsy 03/22/17 DIAGNOSIS:  A. LIVER; BIOPSY:  - MODERATE CHOLESTASIS WITH FOCAL MILD BILE DUCTULAR REACTION.  - SEE COMMENT.   Comment:  Sections demonstrate several intact cores of hepatic parenchyma with  adequate numbers of portal tracts present for evaluation. Mild  cholestasis is present with mild bile ductular reaction; findings  compatible with partial bile duct outflow obstruction. Bile ducts are  intact, without inflammation or loss. Scattered eosinophils are noted  within portal areas as well as in the lobule. Macrovesicular steatosis  involves 5% of the parenchyma without evidence of hepatic injury. PASD  stain highlights evidence of resolving hepatitic process. PASD  Intrahepatocyte globules are not identified. Stainable iron is not  present. Portal and peri-ductal fibrosis are not identified; trichrome  stain confirms. Stain controls worked appropriately.  The findings are compatible resolving partial bile duct outflow  obstruction. The patient's recent choledocolithiasis is noted. The  presence of eosinophils raises the possibility of superimposed  cholestatic drug reaction. Review of medications including herbal  supplements is advised.    Past Medical History:  Diagnosis Date  . Biliary obstruction   . Depression   . Mood disorder [facility])     Past Surgical History:  Procedure Laterality Date  . ENDOSCOPIC RETROGRADE CHOLANGIOPANCREATOGRAPHY (ERCP) WITH PROPOFOL N/A 03/11/2017   Procedure: ENDOSCOPIC RETROGRADE CHOLANGIOPANCREATOGRAPHY (ERCP) WITH PROPOFOL;  Surgeon: Lucilla Lame, MD;  Location: [facility] ENDOSCOPY;  Service: Endoscopy;  Laterality: N/A;  . ERCP N/A 03/19/2017   Procedure: ENDOSCOPIC RETROGRADE CHOLANGIOPANCREATOGRAPHY (ERCP);  Surgeon: Lucilla Lame, MD;   Location: [facility] ENDOSCOPY;  Service: Endoscopy;  Laterality: N/A;  . none       Current Outpatient Medications:  .  colestipol (COLESTID) 5 g granules, Take 5 g by mouth 4 (four) times daily., Disp: 300 g, Rfl: 0 .  OLANZapine (ZYPREXA) 10 MG tablet, Take 10 mg by mouth at bedtime., Disp: , Rfl:  .  ondansetron (ZOFRAN) 4 MG tablet, Take 1 tablet (4 mg total) by mouth every 8 (eight) hours as needed for nausea or vomiting., Disp: 30 tablet, Rfl: 1 .  sertraline (ZOLOFT) 50 MG tablet, Take 1.5 tablets (75 mg total) by mouth daily., Disp: 45 tablet, Rfl: 0   Family History  Problem Relation Age of Onset  . Cancer Maternal Aunt   . Breast cancer Maternal Grandmother   . Lung cancer Maternal Grandfather   . Diabetes Mellitus II Mother      Social History   Tobacco Use  . Smoking status: Current Every Day Smoker    Packs/day: 1.00    Years: 18.00    Pack years: 18.00    Types: E-cigarettes  . Smokeless tobacco: Never Used  Substance Use Topics  . Alcohol use: No    Frequency: Never    Comment: Occasional wine about every 3-4 months  . Drug use: No    Allergies as of 03/29/2017  . (No Known Allergies)    Review of Systems:    All systems reviewed and negative except where noted in HPI.   Physical Exam:  BP 105/69   Pulse 78  Temp 97.8 F (36.6 C) (Oral)   Ht 5\' 8"  (1.727 m)   Wt 187 lb 3.2 oz (84.9 kg)   BMI 28.46 kg/m  No LMP for male patient.  General:   Alert,  Well-developed, well-nourished, pleasant and cooperative in NAD Head:  Normocephalic and atraumatic. Eyes:  Sclera icteric.   Conjunctiva pink. Ears:  Normal auditory acuity. Nose:  No deformity, discharge, or lesions. Mouth:  No deformity or lesions,oropharynx pink & moist. Neck:  Supple; no masses or thyromegaly. Lungs:  Respirations even and unlabored.  Clear throughout to auscultation.   No wheezes, crackles, or rhonchi. No acute distress. Heart:  Regular rate and rhythm; no murmurs, clicks, rubs,  or gallops. Abdomen:  Normal bowel sounds. Soft, non-tender and non-distended without masses, hepatosplenomegaly or hernias noted.  No guarding or rebound tenderness.   Rectal: Nor performed Msk:  Symmetrical without gross deformities. Good, equal movement & strength bilaterally. Pulses:  Normal pulses noted. Extremities:  No clubbing or edema.  No cyanosis. Neurologic:  Alert and oriented x3;  grossly normal neurologically. Skin:  Dry scaly skin in his palms, without significant lesions or rashes. Lymph Nodes:  No significant cervical adenopathy. Psych:  Alert and cooperative. Normal mood and affect.  Imaging Studies: Reviewed  Assessment and Plan:   Travis Hawkins is a 41 y.o. black male with h/o bipolar on zyprexa persistent choledocholithiasis that resulted in prolonged cholestasis, extra and intrahepatic biliary ductal dilation, s/p ERCP x 2 with biliary sphincterotomy, stone extraction and plastic biliary stent placement, s/p liver biopsy here as a hospital f/u due to persistently elevated LFTs and ongoing symptoms including weight loss. He is frustrated with ongoing symptoms. I tried to council patient that cholestasis will take time to clear from blood in order to relieve symptoms. Over all, his LFTs are improving which is reassuring that there is no ongoing bile duct obstruction. He does not have liver failure.   Work up so far negative for malignancy. 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 due to increased eosinophils, otherwise bile duct stasis due to choledocho Secondary liver disease work up is negative including HIV, Hep A, B , C, ANA, ASMA, AMA,  IgG4, immunoglobulins, p-ANCA, Ferritin is mildly elevated Check serum ceruloplasmin and alfa1 anti-trypsin levels Check TSH, gold quantiferon, anti-LKM, anti-scleroderma Increase cholestyramine to 4gm TID Stop zyprexa Start sertraline 75mg  at bedtime Manage constipation Zofran for nausea Recheck LFTs in 1week, ordered As patient is  frustrated with ongoing symptoms and he seems not convinced that his symptoms are secondary to choledocholithiasis despite explaining him that we did extensive work up for other etiologies which came back unremarkable, I recommended him that I can refer him to [facility] for second opinion with liver specialist. He said he can't wait until he gets appt, I told him to go to ER today to get admitted and expedite any other work up. He initially said he would go to ER then decided to wait. I told him to certainly f/u with [facility] surgery to discuss about cholecystectomy  Follow up in 4weeks   Travis Darby, MD

## 2017-03-29 NOTE — Telephone Encounter (Signed)
Pt has been asked to take zofran q6hrs as needed from q8 hrs.  He stated that he is good on his rxs now.  Thanks [employer]

## 2017-03-30 ENCOUNTER — Telehealth: Payer: Self-pay | Admitting: Gastroenterology

## 2017-03-30 NOTE — Addendum Note (Signed)
Addended by: Cephas Darby on: 03/30/2017 08:29 AM   Modules accepted: Orders

## 2017-03-30 NOTE — Telephone Encounter (Signed)
Spoke with Dr [place] Strickland about changing his psych meds given cholestasis. She was ok with stopping zyprexa and starting sertraline 75mg  daily. She wanted to see him next week  Informed pt to decrease zyprexa to 5mg  for 2 days then stop Start sertraline 75mg   He can walk in to her clinic next week to see her. No appointment required  Cephas Darby, MD [address]  [place]  [place], [place] [address]  Main: [phone]  Fax: [phone] Pager: [phone]

## 2017-04-03 DIAGNOSIS — R7989 Other specified abnormal findings of blood chemistry: Secondary | ICD-10-CM | POA: Insufficient documentation

## 2017-04-03 DIAGNOSIS — R945 Abnormal results of liver function studies: Secondary | ICD-10-CM

## 2017-04-03 DIAGNOSIS — K831 Obstruction of bile duct: Secondary | ICD-10-CM | POA: Insufficient documentation

## 2017-04-03 MED ORDER — ACETAMINOPHEN 500 MG PO TABS
500.00 mg | ORAL_TABLET | ORAL | Status: DC
Start: ? — End: 2017-04-03

## 2017-04-03 MED ORDER — COLESTIPOL HCL 1 G PO TABS
5.00 | ORAL_TABLET | ORAL | Status: DC
Start: 2017-04-04 — End: 2017-04-03

## 2017-04-03 MED ORDER — LACTATED RINGERS IV SOLN
200.00 | INTRAVENOUS | Status: DC
Start: ? — End: 2017-04-03

## 2017-04-03 MED ORDER — POLYETHYLENE GLYCOL 3350 17 G PO PACK
17.00 | PACK | ORAL | Status: DC
Start: 2017-04-04 — End: 2017-04-03

## 2017-04-03 MED ORDER — HYDROXYZINE HCL 25 MG PO TABS
25.00 mg | ORAL_TABLET | ORAL | Status: DC
Start: ? — End: 2017-04-03

## 2017-04-03 MED ORDER — ONDANSETRON 4 MG PO TBDP
4.00 mg | ORAL_TABLET | ORAL | Status: DC
Start: ? — End: 2017-04-03

## 2017-04-03 MED ORDER — GENERIC EXTERNAL MEDICATION
100.00 | Status: DC
Start: ? — End: 2017-04-03

## 2017-04-03 MED ORDER — OLANZAPINE 10 MG PO TABS
10.00 mg | ORAL_TABLET | ORAL | Status: DC
Start: 2017-04-04 — End: 2017-04-03

## 2017-05-03 ENCOUNTER — Other Ambulatory Visit: Payer: Self-pay

## 2017-05-06 ENCOUNTER — Encounter (INDEPENDENT_AMBULATORY_CARE_PROVIDER_SITE_OTHER): Payer: Self-pay

## 2017-05-06 ENCOUNTER — Encounter: Payer: Self-pay | Admitting: Gastroenterology

## 2017-05-06 ENCOUNTER — Telehealth: Payer: Self-pay

## 2017-05-06 ENCOUNTER — Ambulatory Visit (INDEPENDENT_AMBULATORY_CARE_PROVIDER_SITE_OTHER): Payer: BLUE CROSS/BLUE SHIELD | Admitting: Gastroenterology

## 2017-05-06 ENCOUNTER — Other Ambulatory Visit: Payer: Self-pay

## 2017-05-06 DIAGNOSIS — R1013 Epigastric pain: Secondary | ICD-10-CM

## 2017-05-06 DIAGNOSIS — K8050 Calculus of bile duct without cholangitis or cholecystitis without obstruction: Secondary | ICD-10-CM

## 2017-05-06 DIAGNOSIS — K719 Toxic liver disease, unspecified: Secondary | ICD-10-CM | POA: Diagnosis not present

## 2017-05-06 NOTE — Telephone Encounter (Signed)
Patient needs to have a stent removal done by Dr. Allen Norris.  Patient has had stent placed by [facility] but is requesting Dr. Allen Norris does the removal.  He placed original stent for patient.

## 2017-05-06 NOTE — Progress Notes (Signed)
Cephas Darby, MD [address]  [place]  [place], [place] [address]  Main: [phone]  Fax: [phone]    Gastroenterology Consultation  Referring Provider:     No ref. provider found Primary Care Physician:  Patient, No Pcp Per Primary Gastroenterologist:  Dr. Cephas Darby Reason for Consultation:   Cholestatic jaundice        HPI:   MICHIO THIER is a 42 y.o. 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 He underwent ERCP with biliary sphincterotomy and CBD stent placement on 03/11/17. He also had mild acute pancreatitis. However, his Bilirubin did not improve after biliary decompression and was discharged home on 03/12/17. His symptoms persisted and was readmitted on 03/13/17. His LFTs are significantly elevated including TBili/DBili and AP. Surgery and GI were consulted. He had repeat CT abdomen during second admission on 03/13/17 which revealed CBD stent in place and no biliary dilation. He was started on antibiotics. He was evaluated by surgery again and did not recommend cholecystectomy. He underwent repeat ERCP by Dr Allen Norris on 03/19/17 with stent exchange and free flow of bile with no stent obstruction. Given persistent cholestasis, he was further evaluated for intrinsic liver injury including AIH, PBC, PSC, viral hepatitis, HIV, IgG4, immunoglobulins, p-ANCA and liver biopsy. His secondary liver disease work up came back unremarkable. He was discharged home on 03/20/17 on cipro, atarax, urso. His liver biopsy which was performed after discharge revealed MODERATE CHOLESTASIS WITH FOCAL MILD BILE DUCTULAR REACTION,  findings compatible with partial bile duct outflow obstruction and few eosinophils which are possibly due to cholestatic drug reaction.   Interval  history 03/29/17:  Since discharge, I have been in touch with pt and his fiance via phone regularly as he continued to have intense itching, severe constipation and flare up of hemorrhoids. I stopped cipro, swithced from urso to cholestyramine, started him on magnesium citrate, psyllium, mirarlax, fleet enemas and he finally had a BM. He is dealing with flare up of hemorrhoids. He had repeat Labs yesterday which showed improvement in LFTs and he also felt itching is gradually getting better after switching to cholestyramine.   Today, he reports that he continues to feel nauseous, decreased appetite, altered taste, altered smell, lost 20lbs in 1 month, and ongoing pruritus. He is frustrated with ongoing symptoms, weight loss, unable to go back to work. He wants to know when his gallbladder is going to come out.   Apparently, pt said he has been dealing with symptoms for almost a month prior to his initial admission as he was waiting on his insurance. 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 He denies having BM in last 3days. He denies abdominal pain, f/c/ recent abx, NSAID use He is a smoker, denies illicit drug use, ETOH. No new medications He denies taking OTC/herbal supplements  Follow-up visit 05/06/2017: Since last visit, patient admitted to [facility] in 03/2017 for further evaluation of persistent jaundice. He underwent upper EUS which did not reveal any significant abnormality of the pancreas. He subsequently underwent ERCP with stent exchange for mild biliary stricture. He underwent MRCP which did not reveal any discrete pancreatic mass lesion. 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 His LFTs have significantly improved over time. He denies experiencing pruritus. He is no longer on bile acid sequestrants. He is discharged home on Zyprexa. Patient reported that on  the day of ERCP, postprocedure he experienced severe epigastric pain, x-ray abdomen and ruled out perforation and he was discharged home on narcotics. His epigastric pain was under control on narcotic medication. He ran out of this about 10 days ago, has been taking Aleve 2 times a day. He continues to experience mild to moderate epigastric discomfort with no relation to food. He denies fever, chills, nausea, vomiting. He also noticed a small knot in the periumbilical area which is not painful. His appetite is improved, gained about 16 pounds since last visit. He is going back to work on 05/14/2017. He denies any other GI symptoms. He is scheduled to see general surgery at [facility] for evaluation of cholecystectomy. He also has follow-up appointment for stent exchange/removal at [facility] in early March.  Work up so far negative for malignancy. 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 - The common bile duct was moderately dilated, acquired. - Choledocholithiasis was found. Complete removal was accomplished by biliary sphincterotomy and balloon extraction. - A biliary sphincterotomy was performed. - The biliary tree was swept. - Drainage of bile was slow and would not drain. - One plastic stent was placed into the common bile duct.  ERCP  03/19/17 - One stent was removed from the biliary tree. - The biliary tree was swept and nothing was found. - One plastic stent was placed into the common bile duct. - The previously placed stent appeared to be draining well. Cytology: DIAGNOSIS:  A. BILE DUCT BRUSHING; ERCP:  - ATYPICAL, FAVOR REACTIVE, SEE  COMMENT.   US guided liver biopsy 03/22/17 DIAGNOSIS:  A. LIVER; BIOPSY:  - MODERATE CHOLESTASIS WITH FOCAL MILD BILE DUCTULAR REACTION.  - SEE COMMENT.   Comment:  Sections demonstrate several intact cores of hepatic parenchyma with  adequate numbers of portal tracts present for evaluation. Mild  cholestasis is present with mild bile ductular reaction; findings  compatible with partial bile duct outflow obstruction. Bile ducts are  intact, without inflammation or loss. Scattered eosinophils are noted  within portal areas as well as in the lobule. Macrovesicular steatosis  involves 5% of the parenchyma without evidence of hepatic injury. PASD  stain highlights evidence of resolving hepatitic process. PASD  Intrahepatocyte globules are not identified. Stainable iron is not  present. Portal and peri-ductal fibrosis are not identified; trichrome  stain confirms. Stain controls worked appropriately.  The findings are compatible resolving partial bile duct outflow  obstruction. The patient's recent choledocolithiasis is noted. The  presence of eosinophils raises the possibility of superimposed  cholestatic drug reaction. Review of medications including herbal  supplements is advised.    Past Medical History:  Diagnosis Date  . Biliary obstruction   . Depression   . Mood disorder [facility])     Past Surgical History:  Procedure Laterality Date  . ENDOSCOPIC RETROGRADE CHOLANGIOPANCREATOGRAPHY (ERCP) WITH PROPOFOL N/A 03/11/2017   Procedure: ENDOSCOPIC RETROGRADE CHOLANGIOPANCREATOGRAPHY (ERCP) WITH PROPOFOL;  Surgeon: Lucilla Lame, MD;  Location: [facility] ENDOSCOPY;  Service:  Endoscopy;  Laterality: N/A;  . ERCP N/A 03/19/2017   Procedure: ENDOSCOPIC RETROGRADE CHOLANGIOPANCREATOGRAPHY (ERCP);  Surgeon: Lucilla Lame, MD;  Location: [facility] ENDOSCOPY;  Service: Endoscopy;  Laterality: N/A;  . none       Current Outpatient Medications:  .  OLANZapine (ZYPREXA) 10 MG tablet, Take 10 mg by  mouth at bedtime., Disp: , Rfl:    Family History  Problem Relation Age of Onset  . Cancer Maternal Aunt   . Breast cancer Maternal Grandmother   . Lung cancer Maternal Grandfather   . Diabetes Mellitus II Mother      Social History   Tobacco Use  . Smoking status: Current Every Day Smoker    Packs/day: 1.00    Years: 18.00    Pack years: 18.00    Types: E-cigarettes  . Smokeless tobacco: Never Used  Substance Use Topics  . Alcohol use: No    Frequency: Never    Comment: Occasional wine about every 3-4 months  . Drug use: No    Allergies as of 05/06/2017 - Review Complete 03/29/2017  Allergen Reaction Noted  . Lamotrigine Other (See Comments) 04/09/2017    Review of Systems:    All systems reviewed and negative except where noted in HPI.   Physical Exam:  There were no vitals taken for this visit. No LMP for male patient.  General:   Alert,  Well-developed, well-nourished, pleasant and cooperative in NAD Head:  Normocephalic and atraumatic. Eyes:  Sclera icteric.   Conjunctiva pink. Ears:  Normal auditory acuity. Nose:  No deformity, discharge, or lesions. Mouth:  No deformity or lesions,oropharynx pink & moist. Neck:  Supple; no masses or thyromegaly. Lungs:  Respirations even and unlabored.  Clear throughout to auscultation.   No wheezes, crackles, or rhonchi. No acute distress. Heart:  Regular rate and rhythm; no murmurs, clicks, rubs, or gallops. Abdomen:  Normal bowel sounds. Soft, mild tenderness in the epigastric region, a small collection of fat in the periumbilical area, nontender and non-distended without masses, hepatosplenomegaly or hernias noted.  No guarding or rebound tenderness.   Rectal: Nor performed Msk:  Symmetrical without gross deformities. Good, equal movement & strength bilaterally. Pulses:  Normal pulses noted. Extremities:  No clubbing or edema.  No cyanosis. Neurologic:  Alert and oriented x3;  grossly normal neurologically. Skin:   without significant lesions or rashes. Lymph Nodes:  No significant cervical adenopathy. Psych:  Alert and cooperative. Normal mood and affect.  Imaging Studies: Reviewed  Assessment and Plan:   REYCE LUBECK is a 42 y.o. black male with h/o bipolar on zyprexa and lamotrigine with recent h/o choledocholithiasis that resulted in prolonged cholestasis, extra and intrahepatic biliary ductal dilation, s/p ERCP x 2 with biliary sphincterotomy, stone extraction and plastic biliary stent placement, s/p liver biopsy which revealed drug-induced liver injury, recent admission to [facility] secondary to persistently elevated LFTs. 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 EUS was unremarkable for any focal pancreatic lesions or peripancreatic fluid collections. He continues to have epigastric pain. Differentials for epigastric pain include chronic pancreatitis or peripancreatic fluid collection  Epigastric pain: - EGD on 04/05/2017 did not reveal any evidence of peptic ulcer disease - Recommend trial of Creon 72K at each meal,  - If pain persists recommend CT A/P to evaluate for peripancreatic fluid collection - Avoid Aleve - Check lipase today  Choledocholithiasis: status post ERCP with biliary sphincterotomy, stone  extraction and biliary stent exchange on 04/05/2017 - Patient requesting to follow up with Dr. 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 James Ivanoff, MD

## 2017-05-07 NOTE — Telephone Encounter (Signed)
Left vm for pt to return my call to schedule repeat ERCP stent removal.

## 2017-05-08 ENCOUNTER — Other Ambulatory Visit: Payer: Self-pay

## 2017-05-08 DIAGNOSIS — Z4659 Encounter for fitting and adjustment of other gastrointestinal appliance and device: Secondary | ICD-10-CM

## 2017-05-08 NOTE — Telephone Encounter (Signed)
FYI... Pt has been scheduled for the ERCP stent removal with Dr. Allen Norris on 06/25/17.

## 2017-05-09 ENCOUNTER — Telehealth: Payer: Self-pay | Admitting: Gastroenterology

## 2017-05-09 NOTE — Telephone Encounter (Signed)
Dr. Verlin Grills patient. You worked with him but he needs some pain medication so he can go back to work. [facility] on [address] Call patient at [phone]

## 2017-05-10 ENCOUNTER — Telehealth: Payer: Self-pay | Admitting: Gastroenterology

## 2017-05-10 ENCOUNTER — Other Ambulatory Visit
Admission: RE | Admit: 2017-05-10 | Discharge: 2017-05-10 | Disposition: A | Discharge status: Home / Self Care | Payer: BLUE CROSS/BLUE SHIELD | Source: Ambulatory Visit | Attending: Gastroenterology | Admitting: Gastroenterology

## 2017-05-10 ENCOUNTER — Other Ambulatory Visit: Payer: Self-pay | Admitting: Gastroenterology

## 2017-05-10 DIAGNOSIS — R140 Abdominal distension (gaseous): Secondary | ICD-10-CM | POA: Insufficient documentation

## 2017-05-10 DIAGNOSIS — R1013 Epigastric pain: Secondary | ICD-10-CM | POA: Diagnosis present

## 2017-05-10 DIAGNOSIS — G8929 Other chronic pain: Secondary | ICD-10-CM | POA: Insufficient documentation

## 2017-05-10 LAB — HEPATIC FUNCTION PANEL
ALT: 48 U/L (ref 17–63)
AST: 34 U/L (ref 15–41)
Albumin: 4.3 g/dL (ref 3.5–5.0)
Alkaline Phosphatase: 99 U/L (ref 38–126)
BILIRUBIN DIRECT: 0.3 mg/dL (ref 0.1–0.5)
BILIRUBIN TOTAL: 0.8 mg/dL (ref 0.3–1.2)
Indirect Bilirubin: 0.5 mg/dL (ref 0.3–0.9)
Total Protein: 7.5 g/dL (ref 6.5–8.1)

## 2017-05-10 LAB — LIPASE, BLOOD: Lipase: 1293 U/L — ABNORMAL HIGH (ref 11–51)

## 2017-05-10 NOTE — Progress Notes (Signed)
Returned call. Told him that I do not prescribe pain medication. He is due for his labs and asked him to go to [facility] today. I will also order CT A/P to look for any peripancreatic fluid collections that might explain his upper abdominal pain. If CT negative, I will discuss with Dr. Allen Norris about early biliary stent removal if that might be causing his pain.  Cephas Darby, MD [address]  [place]  [place], [place] [address]  Main: [phone]  Fax: [phone] Pager: [phone]

## 2017-05-10 NOTE — Telephone Encounter (Signed)
Patient calling for pain medication for stomach pain, please call.

## 2017-05-13 ENCOUNTER — Other Ambulatory Visit: Payer: Self-pay

## 2017-05-13 ENCOUNTER — Emergency Department: Payer: BLUE CROSS/BLUE SHIELD

## 2017-05-13 ENCOUNTER — Telehealth: Payer: Self-pay | Admitting: Gastroenterology

## 2017-05-13 ENCOUNTER — Ambulatory Visit: Payer: Self-pay | Admitting: Gastroenterology

## 2017-05-13 ENCOUNTER — Inpatient Hospital Stay
Admission: EM | Admit: 2017-05-13 | Discharge: 2017-05-14 | DRG: 440 | Disposition: A | Discharge status: Home / Self Care | Payer: BLUE CROSS/BLUE SHIELD | Attending: Internal Medicine | Admitting: Internal Medicine

## 2017-05-13 ENCOUNTER — Encounter: Payer: Self-pay | Admitting: Emergency Medicine

## 2017-05-13 DIAGNOSIS — G8929 Other chronic pain: Secondary | ICD-10-CM | POA: Diagnosis present

## 2017-05-13 DIAGNOSIS — Z888 Allergy status to other drugs, medicaments and biological substances status: Secondary | ICD-10-CM | POA: Diagnosis not present

## 2017-05-13 DIAGNOSIS — Z978 Presence of other specified devices: Secondary | ICD-10-CM | POA: Diagnosis not present

## 2017-05-13 DIAGNOSIS — F39 Unspecified mood [affective] disorder: Secondary | ICD-10-CM | POA: Diagnosis present

## 2017-05-13 DIAGNOSIS — R748 Abnormal levels of other serum enzymes: Secondary | ICD-10-CM | POA: Diagnosis present

## 2017-05-13 DIAGNOSIS — Z79899 Other long term (current) drug therapy: Secondary | ICD-10-CM | POA: Diagnosis not present

## 2017-05-13 DIAGNOSIS — F329 Major depressive disorder, single episode, unspecified: Secondary | ICD-10-CM | POA: Diagnosis present

## 2017-05-13 DIAGNOSIS — F17290 Nicotine dependence, other tobacco product, uncomplicated: Secondary | ICD-10-CM | POA: Diagnosis present

## 2017-05-13 DIAGNOSIS — K859 Acute pancreatitis without necrosis or infection, unspecified: Principal | ICD-10-CM | POA: Diagnosis present

## 2017-05-13 DIAGNOSIS — R140 Abdominal distension (gaseous): Secondary | ICD-10-CM

## 2017-05-13 DIAGNOSIS — K5900 Constipation, unspecified: Secondary | ICD-10-CM | POA: Diagnosis not present

## 2017-05-13 DIAGNOSIS — K8500 Idiopathic acute pancreatitis without necrosis or infection: Secondary | ICD-10-CM | POA: Diagnosis not present

## 2017-05-13 LAB — URINALYSIS, COMPLETE (UACMP) WITH MICROSCOPIC
BILIRUBIN URINE: NEGATIVE
Bacteria, UA: NONE SEEN
Glucose, UA: NEGATIVE mg/dL
Ketones, ur: NEGATIVE mg/dL
Leukocytes, UA: NEGATIVE
NITRITE: NEGATIVE
PH: 7 (ref 5.0–8.0)
Protein, ur: NEGATIVE mg/dL
RBC / HPF: NONE SEEN RBC/hpf (ref 0–5)
SPECIFIC GRAVITY, URINE: 1.004 — AB (ref 1.005–1.030)
Squamous Epithelial / LPF: NONE SEEN

## 2017-05-13 LAB — COMPREHENSIVE METABOLIC PANEL
ALBUMIN: 4.1 g/dL (ref 3.5–5.0)
ALK PHOS: 90 U/L (ref 38–126)
ALT: 47 U/L (ref 17–63)
AST: 36 U/L (ref 15–41)
Anion gap: 11 (ref 5–15)
BILIRUBIN TOTAL: 1.1 mg/dL (ref 0.3–1.2)
BUN: 7 mg/dL (ref 6–20)
CALCIUM: 9.5 mg/dL (ref 8.9–10.3)
CO2: 24 mmol/L (ref 22–32)
Chloride: 104 mmol/L (ref 101–111)
Creatinine, Ser: 0.9 mg/dL (ref 0.61–1.24)
GFR calc Af Amer: >60 mL/min (ref >60)
GFR calc non Af Amer: >60 mL/min (ref >60)
GLUCOSE: 164 mg/dL — AB (ref 65–99)
Potassium: 3.7 mmol/L (ref 3.5–5.1)
Sodium: 139 mmol/L (ref 135–145)
TOTAL PROTEIN: 7.7 g/dL (ref 6.5–8.1)

## 2017-05-13 LAB — CBC
HCT: 39.9 % — ABNORMAL LOW (ref 40.0–52.0)
Hemoglobin: 13.6 g/dL (ref 13.0–18.0)
MCH: 32.3 pg (ref 26.0–34.0)
MCHC: 34.1 g/dL (ref 32.0–36.0)
MCV: 94.7 fL (ref 80.0–100.0)
Platelets: 226 10*3/uL (ref 150–440)
RBC: 4.22 MIL/uL — ABNORMAL LOW (ref 4.40–5.90)
RDW: 13.4 % (ref 11.5–14.5)
WBC: 8 10*3/uL (ref 3.8–10.6)

## 2017-05-13 LAB — LIPASE, BLOOD: Lipase: 875 U/L — ABNORMAL HIGH (ref 11–51)

## 2017-05-13 MED ORDER — IOPAMIDOL (ISOVUE-370) INJECTION 76%
100.0000 mL | Freq: Once | INTRAVENOUS | Status: AC | PRN
  Administered 2017-05-13: 100 mL via INTRAVENOUS
  Filled 2017-05-13: qty 100

## 2017-05-13 MED ORDER — SODIUM CHLORIDE 0.9 % IV SOLN
INTRAVENOUS | Status: DC
  Administered 2017-05-13 – 2017-05-14 (×3): via INTRAVENOUS

## 2017-05-13 MED ORDER — LACTATED RINGERS IV SOLN
1000.0000 mL | Freq: Once | INTRAVENOUS | Status: AC
  Administered 2017-05-13: 1000 mL via INTRAVENOUS

## 2017-05-13 MED ORDER — OLANZAPINE 10 MG PO TABS
10.0000 mg | ORAL_TABLET | Freq: Every day | ORAL | Status: DC
  Administered 2017-05-13: 10 mg via ORAL
  Filled 2017-05-13 (×2): qty 1

## 2017-05-13 MED ORDER — ACETAMINOPHEN 650 MG RE SUPP: 650.0000 mg | Freq: Four times a day (QID) | RECTAL | Status: DC | PRN

## 2017-05-13 MED ORDER — ONDANSETRON HCL 4 MG/2ML IJ SOLN
4.0000 mg | Freq: Once | INTRAMUSCULAR | Status: AC
  Administered 2017-05-13: 4 mg via INTRAVENOUS
  Filled 2017-05-13: qty 2

## 2017-05-13 MED ORDER — HYDROCODONE-ACETAMINOPHEN 5-325 MG PO TABS
1.0000 | ORAL_TABLET | ORAL | Status: DC | PRN
  Administered 2017-05-14: 1 via ORAL
  Filled 2017-05-13: qty 1

## 2017-05-13 MED ORDER — BISACODYL 5 MG PO TBEC: 5.0000 mg | DELAYED_RELEASE_TABLET | Freq: Every day | ORAL | Status: DC | PRN

## 2017-05-13 MED ORDER — IOPAMIDOL (ISOVUE-300) INJECTION 61%
30.0000 mL | Freq: Once | INTRAVENOUS | Status: AC | PRN
  Administered 2017-05-13: 30 mL via ORAL
  Filled 2017-05-13: qty 30

## 2017-05-13 MED ORDER — NICOTINE 14 MG/24HR TD PT24
14.0000 mg | MEDICATED_PATCH | Freq: Every day | TRANSDERMAL | Status: DC
  Filled 2017-05-13: qty 1

## 2017-05-13 MED ORDER — ALBUTEROL SULFATE (2.5 MG/3ML) 0.083% IN NEBU: 2.5000 mg | INHALATION_SOLUTION | RESPIRATORY_TRACT | Status: DC | PRN

## 2017-05-13 MED ORDER — ONDANSETRON HCL 4 MG PO TABS: 4.0000 mg | ORAL_TABLET | Freq: Four times a day (QID) | ORAL | Status: DC | PRN

## 2017-05-13 MED ORDER — ENOXAPARIN SODIUM 40 MG/0.4ML ~~LOC~~ SOLN: 40.0000 mg | SUBCUTANEOUS | Status: DC

## 2017-05-13 MED ORDER — MORPHINE SULFATE (PF) 4 MG/ML IV SOLN
4.0000 mg | Freq: Once | INTRAVENOUS | Status: AC
  Administered 2017-05-13: 4 mg via INTRAVENOUS
  Filled 2017-05-13: qty 1

## 2017-05-13 MED ORDER — ACETAMINOPHEN 325 MG PO TABS: 650.0000 mg | ORAL_TABLET | Freq: Four times a day (QID) | ORAL | Status: DC | PRN

## 2017-05-13 MED ORDER — MORPHINE SULFATE (PF) 2 MG/ML IV SOLN: 2.0000 mg | INTRAVENOUS | Status: DC | PRN

## 2017-05-13 MED ORDER — SENNOSIDES-DOCUSATE SODIUM 8.6-50 MG PO TABS: 1.0000 | ORAL_TABLET | Freq: Every evening | ORAL | Status: DC | PRN

## 2017-05-13 MED ORDER — ONDANSETRON HCL 4 MG/2ML IJ SOLN: 4.0000 mg | Freq: Four times a day (QID) | INTRAMUSCULAR | Status: DC | PRN

## 2017-05-13 NOTE — Telephone Encounter (Signed)
Lipase is very high and LFTs are normal. He still having epigastric pain. Could not get outpatient CT until 1/17. I'm concerned if he has pseudocyst of the pancreas or acute pancreatitis. I advised him to go to ER at [facility] and get admitted today. Recommend CT A/P with contrast today. He agrees with understanding of the plan  Cephas Darby, MD [address]  [place]  [place], [place] [address]  Main: [phone]  Fax: [phone] Pager: [phone]

## 2017-05-13 NOTE — ED Provider Notes (Signed)
CT consistent with pancreatitis without any other significant acute findings. Case discussed with the hospitalist for admission.   Carrie Mew, MD 05/13/17 1750

## 2017-05-13 NOTE — Telephone Encounter (Signed)
Patient has been informed of his appt for CT Abd and Pelvis at [facility] 0/17/19 at 1:30pm.  He has been asked to go to hospital to pick up contrast and instructions.   I will provide pt an updated work note stating that he may go back to work on 05/20/17.  Thanks [employer]

## 2017-05-13 NOTE — H&P (Signed)
[place] [facility] [place] NAME: Marshal Eskew    MR#:  [id number]  DATE OF BIRTH:  [birth year]  DATE OF ADMISSION:  05/13/2017  PRIMARY CARE PHYSICIAN: Patient, No Pcp Per   REQUESTING/REFERRING PHYSICIAN: Dr. Joni Fears  CHIEF COMPLAINT:   Chief Complaint  Patient presents with  . Abdominal Pain   Abdominal pain for 1 month, worsening recently.  Elevated lipase. HISTORY OF PRESENT ILLNESS:  Floyd Wade  is a 42 y.o. male with a known history of biliary obstruction, depression and the mood disorder.  The patient was sent to ED from GI physician's office due to above chief complaint.  He has had epigastric abdominal pain for 1 months, which is aching, intermittent, worse at night without radiation.  The patient has a choledochal cholelithiasis, treated with biliary stent in this hospital last December and sent to [facility] for further evaluation.  The patient got additional stent [facility].  He has had abdominal pain since that time.  He was found elevated lipase in GI physician's office.  CAT scan of the abdomen showed acute pancreatitis. PAST MEDICAL HISTORY:   Past Medical History:  Diagnosis Date  . Biliary obstruction   . Depression   . Mood disorder ([place])     PAST SURGICAL HISTORY:   Past Surgical History:  Procedure Laterality Date  . ENDOSCOPIC RETROGRADE CHOLANGIOPANCREATOGRAPHY (ERCP) WITH PROPOFOL N/A 03/11/2017   Procedure: ENDOSCOPIC RETROGRADE CHOLANGIOPANCREATOGRAPHY (ERCP) WITH PROPOFOL;  Surgeon: Lucilla Lame, MD;  Location: [facility] ENDOSCOPY;  Service: Endoscopy;  Laterality: N/A;  . ERCP N/A 03/19/2017   Procedure: ENDOSCOPIC RETROGRADE CHOLANGIOPANCREATOGRAPHY (ERCP);  Surgeon: Lucilla Lame, MD;  Location: [facility] ENDOSCOPY;  Service: Endoscopy;  Laterality: N/A;  . none      SOCIAL HISTORY:   Social History   Tobacco Use  . Smoking status: Current Every Day Smoker    Packs/day: 1.00    Years: 18.00    Pack years: 18.00    Types:  E-cigarettes  . Smokeless tobacco: Never Used  Substance Use Topics  . Alcohol use: No    Frequency: Never    Comment: Occasional wine about every 3-4 months    FAMILY HISTORY:   Family History  Problem Relation Age of Onset  . Cancer Maternal Aunt   . Breast cancer Maternal Grandmother   . Lung cancer Maternal Grandfather   . Diabetes Mellitus II Mother     DRUG ALLERGIES:   Allergies  Allergen Reactions  . Lamotrigine Other (See Comments)    DILI 2018    REVIEW OF SYSTEMS:   Review of Systems  Constitutional: Negative for chills, fever and malaise/fatigue.  HENT: Negative for sore throat.   Eyes: Negative for blurred vision and double vision.  Respiratory: Negative for cough, hemoptysis, shortness of breath, wheezing and stridor.   Cardiovascular: Negative for chest pain, palpitations, orthopnea and leg swelling.  Gastrointestinal: Positive for abdominal pain. Negative for blood in stool, diarrhea, melena, nausea and vomiting.  Genitourinary: Negative for dysuria, flank pain and hematuria.  Musculoskeletal: Negative for back pain and joint pain.  Skin: Negative for rash.  Neurological: Negative for dizziness, sensory change, focal weakness, seizures, loss of consciousness, weakness and headaches.  Endo/Heme/Allergies: Negative for polydipsia.  Psychiatric/Behavioral: Negative for depression. The patient is not nervous/anxious.     MEDICATIONS AT HOME:   Prior to Admission medications   Medication Sig Start Date End Date Taking? Authorizing Provider  OLANZapine (ZYPREXA) 10 MG tablet Take 10 mg by mouth  at bedtime.   Yes [provider]      VITAL SIGNS:  Blood pressure 112/77, pulse 67, temperature 97.7 F (36.5 C), temperature source Oral, resp. rate 16, weight 195 lb (88.5 kg), SpO2 99 %.  PHYSICAL EXAMINATION:  Physical Exam  GENERAL:  42 y.o.-year-old patient lying in the bed with no acute distress.  EYES: Pupils equal, round, reactive to  light and accommodation. No scleral icterus. Extraocular muscles intact.  HEENT: Head atraumatic, normocephalic. Oropharynx and nasopharynx clear.  NECK:  Supple, no jugular venous distention. No thyroid enlargement, no tenderness.  LUNGS: Normal breath sounds bilaterally, no wheezing, rales,rhonchi or crepitation. No use of accessory muscles of respiration.  CARDIOVASCULAR: S1, S2 normal. No murmurs, rubs, or gallops.  ABDOMEN: Soft, tenderness in the middle part of the abdomen, nondistended. Bowel sounds present. No organomegaly or mass.  EXTREMITIES: No pedal edema, cyanosis, or clubbing.  NEUROLOGIC: Cranial nerves II through XII are intact. Muscle strength 5/5 in all extremities. Sensation intact. Gait not checked.  PSYCHIATRIC: The patient is alert and oriented x 3.  SKIN: No obvious rash, lesion, or ulcer.   LABORATORY PANEL:   CBC Recent Labs  Lab 05/13/17 1455  WBC 8.0  HGB 13.6  HCT 39.9*  PLT 226   ------------------------------------------------------------------------------------------------------------------  Chemistries  Recent Labs  Lab 05/13/17 1455  NA 139  K 3.7  CL 104  CO2 24  GLUCOSE 164*  BUN 7  CREATININE 0.90  CALCIUM 9.5  AST 36  ALT 47  ALKPHOS 90  BILITOT 1.1   ------------------------------------------------------------------------------------------------------------------  Cardiac Enzymes No results for input(s): TROPONINI in the last 168 hours. ------------------------------------------------------------------------------------------------------------------  RADIOLOGY:  Ct Abdomen Pelvis W Contrast  Result Date: 05/13/2017 CLINICAL DATA:  42 year old male with epigastric pain. Recent choledocholithiasis and stent placement. Acute abdominal pain EXAM: CT ABDOMEN AND PELVIS WITH CONTRAST TECHNIQUE: Multidetector CT imaging of the abdomen and pelvis was performed using the standard protocol following bolus administration of intravenous  contrast. CONTRAST:  124mL ISOVUE-370 IOPAMIDOL (ISOVUE-370) INJECTION 76% COMPARISON:  CT 03/13/2017 FINDINGS: Lower chest: Lung bases are clear. Hepatobiliary: Pneumobilia present. No biliary duct dilatation. The gallbladder is decompressed. No focal hepatic lesion. There is a wall stent in the distal common bile duct which extends to the ampullary region of the duodenum. Pancreas: No pancreatic duct dilatation. Minimal peripancreatic inflammation along the tail of the pancreas (image 22, series 2). Spleen: Normal spleen Adrenals/urinary tract: Adrenal glands and kidneys are normal. The ureters and bladder normal. Stomach/Bowel: Stomach, small bowel, appendix, and cecum are normal. Several diverticular of the descending colon. Mild bowel wall thickening the sigmoid colon (image 59, series 6). No diverticulosis Vascular/Lymphatic: Abdominal aorta is normal caliber. There is no retroperitoneal or periportal lymphadenopathy. No pelvic lymphadenopathy. Reproductive: Prostate normal Other: Small amount free fluid in the RIGHT lower quadrant inferior to the cecum (image 65, series 2) Musculoskeletal: No aggressive osseous lesion. IMPRESSION: 1. Mild peripancreatic inflammation along the tail the pancreas consistent with mild pancreatitis. No organized fluid collections. 2. Common bile duct wall stent in place without evidence of biliary obstruction. 3. Gallbladder is decompressed.  Small amount pericholecystic fluid. 4. Trace amount of free fluid in the RIGHT lower quadrant. Mild bowel wall thickening through the sigmoid colon suggests mild sigmoid colitis. No diverticulosis. 5. Normal appendix. Electronically Signed   By: Suzy Bouchard M.D.   On: 05/13/2017 17:08      IMPRESSION AND PLAN:   Acute pancreatitis The patient will be admitted to medical floor. Full  liquid diet, pain control, follow-up lipase level and GI consult.  History of choledocholithiasis with biliary obstruction, status post stent  placement. Follow-up with GI consult.  All the records are reviewed and case discussed with ED provider. Management plans discussed with the patient, family and they are in agreement.  CODE STATUS: Full code  TOTAL TIME TAKING CARE OF THIS PATIENT: 53 minutes.    Demetrios Loll M.D on 05/13/2017 at 7:53 PM  Between 7am to 6pm - Pager - [phone]  After 6pm go to [email or web address] - Proofreader  [facility]  Office  [phone]  CC: Primary care physician; Patient, No Pcp Per   Note: This dictation was prepared with Dragon dictation along with smaller phrase technology. Any transcriptional errors that result from this process are unin

## 2017-05-13 NOTE — ED Provider Notes (Signed)
[facility] Emergency Department Provider Note   ____________________________________________    I have reviewed the triage vital signs and the nursing notes.   HISTORY  Chief Complaint Abdominal Pain     HPI Travis Hawkins is a 42 y.o. male who presents with epigastric abdominal pain which is moderate to severe in nature.  Sent in by his gastroenterologist who checked a lipase and found it to be over thousand.  Review of records demonstrates that the patient had choledocholithiasis treated with a biliary stent at [facility] at the beginning of December, continue to have increase in LFTs and sent to [facility] for further evaluation.  Apparently had additional stent placed there and since that time he has had epigastric pain.  Denies EtOH, no ibuprofen.  No fevers or chills.  Mild nausea, no vomiting.  Past Medical History:  Diagnosis Date  . Biliary obstruction   . Depression   . Mood disorder [facility])     Patient Active Problem List   Diagnosis Date Noted  . Cholestasis 04/03/2017  . Elevated LFTs 04/03/2017  . Jaundice, hepatocellular   . Encounter for fitting and adjustment of other gastrointestinal appliance and device   . Acute acalculous cholecystitis 03/13/2017  . Acute pancreatitis   . Transaminitis   . Calculus of bile duct without cholecystitis and without obstruction   . Other specified diseases of biliary tract   . Elevated liver enzymes   . Biliary obstruction 03/09/2017    Past Surgical History:  Procedure Laterality Date  . ENDOSCOPIC RETROGRADE CHOLANGIOPANCREATOGRAPHY (ERCP) WITH PROPOFOL N/A 03/11/2017   Procedure: ENDOSCOPIC RETROGRADE CHOLANGIOPANCREATOGRAPHY (ERCP) WITH PROPOFOL;  Surgeon: Lucilla Lame, MD;  Location: [facility] ENDOSCOPY;  Service: Endoscopy;  Laterality: N/A;  . ERCP N/A 03/19/2017   Procedure: ENDOSCOPIC RETROGRADE CHOLANGIOPANCREATOGRAPHY (ERCP);  Surgeon: Lucilla Lame, MD;  Location: [facility] ENDOSCOPY;  Service: Endoscopy;   Laterality: N/A;  . none      Prior to Admission medications   Medication Sig Start Date End Date Taking? Authorizing Provider  OLANZapine (ZYPREXA) 10 MG tablet Take 10 mg by mouth at bedtime.    [provider]     Allergies Lamotrigine  Family History  Problem Relation Age of Onset  . Cancer Maternal Aunt   . Breast cancer Maternal Grandmother   . Lung cancer Maternal Grandfather   . Diabetes Mellitus II Mother     Social History Social History   Tobacco Use  . Smoking status: Current Every Day Smoker    Packs/day: 1.00    Years: 18.00    Pack years: 18.00    Types: E-cigarettes  . Smokeless tobacco: Never Used  Substance Use Topics  . Alcohol use: No    Frequency: Never    Comment: Occasional wine about every 3-4 months  . Drug use: No    Review of Systems  Constitutional: No fever/chills Eyes: No visual changes.  ENT: No sore throat. Cardiovascular: Denies chest pain. Respiratory: Denies shortness of breath. Gastrointestinal: As above Genitourinary: Negative for dysuria. Musculoskeletal: Negative for back pain. Skin: Negative for rash. Neurological: Negative for headaches   ____________________________________________   PHYSICAL EXAM:  VITAL SIGNS: ED Triage Vitals [05/13/17 1453]  Enc Vitals Group     BP 140/87     Pulse Rate 73     Resp 20     Temp 97.7 F (36.5 C)     Temp Source Oral     SpO2 100 %     Weight 88.5 kg (  195 lb)     Height      Head Circumference      Peak Flow      Pain Score 8     Pain Loc      Pain Edu?      Excl. in [place]?     Constitutional: Alert and oriented. No acute distress.  Eyes: Conjunctivae are normal.   Nose: No congestion/rhinnorhea. Mouth/Throat: Mucous membranes are moist.    Cardiovascular: Normal rate, regular rhythm. Grossly normal heart sounds.  Good peripheral circulation. Respiratory: Normal respiratory effort.  No retractions. Lungs CTAB. Gastrointestinal: Epigastric tenderness.  No distention.  No CVA tenderness. Genitourinary: deferred Musculoskeletal:  Warm and well perfused Neurologic:  Normal speech and language. No gross focal neurologic deficits are appreciated.  Skin:  Skin is warm, dry and intact. No rash noted. Psychiatric: Mood and affect are normal. Speech and behavior are normal.  ____________________________________________   LABS (all labs ordered are listed, but only abnormal results are displayed)  Labs Reviewed  COMPREHENSIVE METABOLIC PANEL - Abnormal; Notable for the following components:      Result Value   Glucose, Bld 164 (*)    All other components within normal limits  CBC - Abnormal; Notable for the following components:   RBC 4.22 (*)    HCT 39.9 (*)    All other components within normal limits  URINALYSIS, COMPLETE (UACMP) WITH MICROSCOPIC - Abnormal; Notable for the following components:   Color, Urine STRAW (*)    APPearance CLEAR (*)    Specific Gravity, Urine 1.004 (*)    Hgb urine dipstick SMALL (*)    All other components within normal limits  LIPASE, BLOOD   ____________________________________________  EKG  None ____________________________________________  RADIOLOGY  CT abdomen pelvis pending ____________________________________________   PROCEDURES  Procedure(s) performed: No  Procedures   Critical Care performed: No ____________________________________________   INITIAL IMPRESSION / ASSESSMENT AND PLAN / ED COURSE  Pertinent labs & imaging results that were available during my care of the patient were reviewed by me and considered in my medical decision making (see chart for details).  Patient presents with epigastric abdominal pain, lipase of 1300 outpatient with a history of biliary stent.  Discussed with Dr. Marius Ditch she requests CT scan to look for pancreatic fluid collection/cysts and admission for further workup  We will asked my partner to follow-up on CT results and admit the patient      ____________________________________________   FINAL CLINICAL IMPRESSION(S) / ED DIAGNOSES  Final diagnoses:  Acute pancreatitis, unspecified complication status, unspecified pancreatitis type        Note:  This document was prepared using Dragon voice recognition software and may include unintentional dictation errors.    Lavonia Drafts, MD 05/13/17 [phone]

## 2017-05-13 NOTE — ED Triage Notes (Signed)
Pt sent over for further eval of abd pain, sent per Dr Marius Ditch, wants blood work checked.

## 2017-05-13 NOTE — ED Notes (Signed)
FN: pt was sent over for further eval of possible pancreatitis. Dr. Marius Ditch sent over.

## 2017-05-13 NOTE — ED Notes (Signed)
Patient was given apple juice per request.

## 2017-05-14 ENCOUNTER — Telehealth: Payer: Self-pay | Admitting: Gastroenterology

## 2017-05-14 DIAGNOSIS — K5900 Constipation, unspecified: Secondary | ICD-10-CM

## 2017-05-14 DIAGNOSIS — K8500 Idiopathic acute pancreatitis without necrosis or infection: Secondary | ICD-10-CM

## 2017-05-14 LAB — BASIC METABOLIC PANEL
Anion gap: 9 (ref 5–15)
BUN: 5 mg/dL — ABNORMAL LOW (ref 6–20)
CO2: 26 mmol/L (ref 22–32)
Calcium: 9 mg/dL (ref 8.9–10.3)
Chloride: 105 mmol/L (ref 101–111)
Creatinine, Ser: 0.64 mg/dL (ref 0.61–1.24)
GFR calc Af Amer: >60 mL/min (ref >60)
GFR calc non Af Amer: >60 mL/min (ref >60)
Glucose, Bld: 97 mg/dL (ref 65–99)
Potassium: 4.4 mmol/L (ref 3.5–5.1)
Sodium: 140 mmol/L (ref 135–145)

## 2017-05-14 LAB — CBC
HCT: 46 % (ref 40.0–52.0)
Hemoglobin: 15.3 g/dL (ref 13.0–18.0)
MCH: 31.8 pg (ref 26.0–34.0)
MCHC: 33.2 g/dL (ref 32.0–36.0)
MCV: 95.7 fL (ref 80.0–100.0)
Platelets: 213 10*3/uL (ref 150–440)
RBC: 4.81 MIL/uL (ref 4.40–5.90)
RDW: 13.7 % (ref 11.5–14.5)
WBC: 6.1 10*3/uL (ref 3.8–10.6)

## 2017-05-14 LAB — LIPASE, BLOOD: Lipase: 621 U/L — ABNORMAL HIGH (ref 11–51)

## 2017-05-14 MED ORDER — HYDROCODONE-ACETAMINOPHEN 5-325 MG PO TABS: 1.0000 | ORAL_TABLET | Freq: Three times a day (TID) | ORAL | 0 refills | Status: DC | PRN

## 2017-05-14 NOTE — Progress Notes (Signed)
05/14/2017 3:33 PM  Travis Hawkins to be D/C'd Home per MD order.  Discussed prescriptions and follow up appointments with the patient. Prescriptions given to patient, medication list explained in detail. Pt verbalized understanding.  Allergies as of 05/14/2017      Reactions   Lamotrigine Other (See Comments)   DILI 2018      Medication List    TAKE these medications   HYDROcodone-acetaminophen 5-325 MG tablet Commonly known as:  NORCO/VICODIN Take 1 tablet by mouth every 8 (eight) hours as needed for severe pain.   ZYPREXA 10 MG tablet Generic drug:  OLANZapine Take 10 mg by mouth at bedtime.       Vitals:   05/14/17 0510 05/14/17 1321  BP: 112/75 129/87  Pulse: (!) 51 69  Resp: 17 14  Temp: (!) 97.4 F (36.3 C) 97.9 F (36.6 C)  SpO2: 100% 100%    Skin clean, dry and intact without evidence of skin break down, no evidence of skin tears noted. IV catheter discontinued intact. Site without signs and symptoms of complications. Dressing and pressure applied. Pt denies pain at this time. No complaints noted.  An After Visit Summary was printed and given to the patient. Patient escorted via [place], and D/C home via private auto.  Dola Argyle

## 2017-05-14 NOTE — Discharge Instructions (Addendum)
Low fat diet

## 2017-05-14 NOTE — Consult Note (Signed)
Travis Antigua, MD [address] [place], [place], [place], [address] [address], [place], [place], [place], [address] Phone: [phone]  Fax: [phone]  Consultation  Referring Provider:     Dr. Darvin Neighbours Primary Care Physician:  Travis Hawkins, Travis Hawkins:  Virgel Manifold, MD        Reason for Consultation:     Pancreatitis  Date of Admission:  05/13/2017 Date of Consultation:  05/14/2017         HPI:   Travis Hawkins is a 42 y.o. male with recent admission for cholestatic jaundice thought to be due to his Lamictal use, and which resolved after stopping the medication admitted with pancreatitis.    Travis Hawkins was initially seen in November by Dr. Marius Ditch when he presented with pruritus and jaundice.  He underwent extensive workup for significantly elevated bilirubin of 7.8 (peaked at 8.9), elevated alk phos 06/03/2011, AST 55, ALT 98.  Marked intra-and extrahepatic bile duct dilation was seen on imaging.  He underwent ERCP by Dr. Allen Norris on November 12 th which showed choledocholithiasis, stone removal, biliary sphincterotomy and biliary stent placement was performed.  Symptoms persisted, and liver enzymes remain elevated, Travis Hawkins underwent repeat ERCP by Dr. Allen Norris on November 20 with stent exchange and free follow-up bile with Travis stent obstruction.  He underwent liver biopsy on November 21 due to persistently elevated liver enzymes that showed moderate cholestasis with focal mild bile ductular reaction, findings compatible with partial bile duct outflow obstruction and few eosinophils which are possibly due to cholestatic drug reaction.  He was admitted to [facility] on December 5 due to persistent jaundice.  EUS and ERCP on December 7 at [facility] showed mild biliary stricture but not severe enough to explain the degree of his LFT abnormalities.  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  And LFTs improved after the drug was stopped.  During that admission, Travis Hawkins had developed post ERCP pancreatitis with lipase of 24,000.  Travis Hawkins reports at the time of his post ERCP pancreatitis he had severe abdominal pain, was doubled over on the floor without pain.  Since then the pain has improved and now just has a nagging cramping sensation in his upper abdomen.  He is able to carry out his activities and eat a regular diet without problems.  Denies any nausea vomiting.  Reports constipation.  Was started on Creon by Dr. Marius Ditch as an outpatient on 7 January.  His lipase is elevated to the 1000s on this admission however, significantly better from his post ERCP pancreatitis at [facility].  His CT on this admission showed mild peripancreatic inflammation along the tail of the pancreas consistent with mild pancreatitis.  Travis organized fluid collections.  Common bile duct Wallstent in place without evidence of biliary obstruction.  Gallbladder is decompressed.  Small amount of pericholecystic fluid. Trace amount of free fluid in the right lower quadrant. Mild bowel wall thickening through the sigmoid colon siggests mild sigmoid colitis. Travis diverticulosis. Normal appendix.   Past Medical History:  Diagnosis Date  . Biliary obstruction   . Depression   . Mood disorder [facility]     Past Surgical History:  Procedure Laterality Date  . ENDOSCOPIC RETROGRADE CHOLANGIOPANCREATOGRAPHY (ERCP) WITH PROPOFOL N/A 03/11/2017   Procedure: ENDOSCOPIC RETROGRADE CHOLANGIOPANCREATOGRAPHY (ERCP) WITH PROPOFOL;  Surgeon: Lucilla Lame, MD;  Location: [facility] ENDOSCOPY;  Service: Endoscopy;  Laterality: N/A;  .  ERCP N/A 03/19/2017   Procedure: ENDOSCOPIC RETROGRADE CHOLANGIOPANCREATOGRAPHY (ERCP);  Surgeon: Lucilla Lame, MD;  Location: [facility] ENDOSCOPY;  Service: Endoscopy;  Laterality: N/A;  . none      Prior to Admission medications   Medication Sig Start Date End Date Taking? Authorizing Provider    OLANZapine (ZYPREXA) 10 MG tablet Take 10 mg by mouth at bedtime.   Yes [provider]    Family History  Problem Relation Age of Onset  . Cancer Maternal Aunt   . Breast cancer Maternal Grandmother   . Lung cancer Maternal Grandfather   . Diabetes Mellitus II Mother      Social History   Tobacco Use  . Smoking status: Current Every Day Smoker    Packs/day: 1.00    Years: 18.00    Pack years: 18.00    Types: E-cigarettes  . Smokeless tobacco: Never Used  Substance Use Topics  . Alcohol use: Travis    Frequency: Never    Comment: Occasional wine about every 3-4 months  . Drug use: Travis    Allergies as of 05/13/2017 - Review Complete 05/13/2017  Allergen Reaction Noted  . Lamotrigine Other (See Comments) 04/09/2017    Review of Systems:    All systems reviewed and negative except where noted in HPI.   Physical Exam:  Vital signs in last 24 hours: Vitals:   05/13/17 1850 05/13/17 2011 05/14/17 0018 05/14/17 0510  BP: 112/77 (!) 143/94  112/75  Pulse: 67 75  (!) 51  Resp: '16 19  17  ' Temp:  97.6 F (36.4 C)  (!) 97.4 F (36.3 C)  TempSrc:  Oral    SpO2: 99% 100%  100%  Weight:   194 lb 9.6 oz (88.3 kg)    Last BM Date: 05/13/17 General:   Pleasant, cooperative in NAD Head:  Normocephalic and atraumatic. Eyes:   Travis icterus.   Conjunctiva pink. PERRLA. Ears:  Normal auditory acuity. Neck:  Supple; Travis masses or thyroidomegaly Lungs: Respirations even and unlabored. Lungs clear to auscultation bilaterally.   Travis wheezes, crackles, or rhonchi.  Heart:  Regular rate and rhythm;  Without murmur, clicks, rubs or gallops Abdomen:  Soft, nondistended, nontender. Normal bowel sounds. Travis appreciable masses or hepatomegaly.  Travis rebound or guarding.  Neurologic:  Alert and oriented x3;  grossly normal neurologically. Skin:  Intact without significant lesions or rashes. Cervical Nodes:  Travis significant cervical adenopathy. Psych:  Alert and cooperative. Normal  affect.  LAB RESULTS: Recent Labs    05/13/17 1455 05/14/17 0707  WBC 8.0 6.1  HGB 13.6 15.3  HCT 39.9* 46.0  PLT 226 213   BMET Recent Labs    05/13/17 1455 05/14/17 0707  NA 139 140  K 3.7 4.4  CL 104 105  CO2 24 26  GLUCOSE 164* 97  BUN 7 5*  CREATININE 0.90 0.64  CALCIUM 9.5 9.0   LFT Recent Labs    05/13/17 1455  PROT 7.7  ALBUMIN 4.1  AST 36  ALT 47  ALKPHOS 90  BILITOT 1.1   PT/INR Travis results for input(s): LABPROT, INR in the last 72 hours.  STUDIES: Ct Abdomen Pelvis W Contrast  Result Date: 05/13/2017 CLINICAL DATA:  42 year old male with epigastric pain. Recent choledocholithiasis and stent placement. Acute abdominal pain EXAM: CT ABDOMEN AND PELVIS WITH CONTRAST TECHNIQUE: Multidetector CT imaging of the abdomen and pelvis was performed using the standard protocol following bolus administration of intravenous contrast. CONTRAST:  169m ISOVUE-370 IOPAMIDOL (ISOVUE-370) INJECTION 76%  COMPARISON:  CT 03/13/2017 FINDINGS: Lower chest: Lung bases are clear. Hepatobiliary: Pneumobilia present. Travis biliary duct dilatation. The gallbladder is decompressed. Travis focal hepatic lesion. There is a wall stent in the distal common bile duct which extends to the ampullary region of the duodenum. Pancreas: Travis pancreatic duct dilatation. Minimal peripancreatic inflammation along the tail of the pancreas (image 22, series 2). Spleen: Normal spleen Adrenals/urinary tract: Adrenal glands and kidneys are normal. The ureters and bladder normal. Stomach/Bowel: Stomach, small bowel, appendix, and cecum are normal. Several diverticular of the descending colon. Mild bowel wall thickening the sigmoid colon (image 59, series 6). Travis diverticulosis Vascular/Lymphatic: Abdominal aorta is normal caliber. There is Travis retroperitoneal or periportal lymphadenopathy. Travis pelvic lymphadenopathy. Reproductive: Prostate normal Other: Small amount free fluid in the RIGHT lower quadrant inferior to the  cecum (image 65, series 2) Musculoskeletal: Travis aggressive osseous lesion. IMPRESSION: 1. Mild peripancreatic inflammation along the tail the pancreas consistent with mild pancreatitis. Travis organized fluid collections. 2. Common bile duct wall stent in place without evidence of biliary obstruction. 3. Gallbladder is decompressed.  Small amount pericholecystic fluid. 4. Trace amount of free fluid in the RIGHT lower quadrant. Mild bowel wall thickening through the sigmoid colon suggests mild sigmoid colitis. Travis diverticulosis. 5. Normal appendix. Electronically Signed   By: Suzy Bouchard M.D.   On: 05/13/2017 17:08   CT images reviewed by me and show stool burden in the colon.    Impression / Plan:   Travis Hawkins is a 42 y.o. y/o male with recent cholestatic jaundice due to DILI from [place] in Nov 2018, with choledocholithiasis, ERCP, biliary sphincterotomy, stone removal and stent placement during that admission, with December 2018 and post ERCP pancreatitis at [facility] afte December 7th ERCP for stent exchange for mild biliary stricture  -Clinically Travis Hawkins's post ERCP pancreatitis from December 7 ERCP procedure at [facility] is continuing to improve -Travis Hawkins had severe abdominal pain after the procedure and now has mild residual abdominal discomfort in the upper abdomen -He is tolerating an oral diet without any problems -He denies any diarrhea and reports constipation and started -He does not have any elevation in his liver enzymes and Travis evidence of biliary duct obstruction or evidence of biliary stent obstruction at this time -Does not have any fever chills or evidence of cholangitis -Since symptoms are continuing to improve, I would recommend continued medical management -Although his lipase is elevated, it has been much improved from the 24,000 and was noted to be in December 2018 at [facility]. -CT shows only mild pancreatitis which is expected -If his symptoms worsen, with worsening  abdominal pain or evidence of biliary stent obstruction, or fever chills, that would be an indication for repeat ERCP for stent exchange. These symptoms do not exist at this time. -I have discussed this in detail with him and is agreeable with the plan -The plan was to remove his stent in 3 months after his last ERCP, Travis Hawkins would like this done at our facility.  This can be arranged as an outpatient. -Travis Hawkins should follow-up with Dr. Marius Ditch scheduled. -The sigmoid colon thickening noted on the CT scan is likely a nonspecific finding given that there are Travis clinical symptoms to correlate to this evening.  He does not have any blood in his stool or diarrhea.  He reports constipation instead, likely due to opiods. -I also discussed the importance of minimizing opioids.  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  As his pain gets better I have asked him to minimize and discontinue opioids over time.  He verbalized understanding.  Since his pancreatitis is mild on his CT scan, his pain should continue to improve and need for pain medication should decrease as well. - I have asked him to eat a high-fiber diet, and take MiraLAX daily for constipation.  Would request primary team to order this inpatient and discharge him on the same medication.  Review of the CT images show stool burden in his colon.  This can also contribute to his abdominal pain -He will continue Creon that was prescribed by Dr. Marius Ditch -If he develops diarrhea or blood in stool or worsening abdominal pain he is to contact us and a flex sig can be considered at that time.  In the setting of resolving pancreatitis, would avoid any elective procedures at this time.  Thank you for involving me in the care of this Travis Hawkins.      LOS: 1 day   Virgel Manifold, MD  05/14/2017, 11:45 AM

## 2017-05-14 NOTE — Telephone Encounter (Signed)
Patient called to say he was in room 220 and wondering if Dr. Marius Ditch was coming over to let him know what was going on. Please call

## 2017-05-15 ENCOUNTER — Other Ambulatory Visit: Payer: Self-pay

## 2017-05-15 NOTE — Telephone Encounter (Signed)
HQUIQNV, I just read this message.  I sent a message to Dr. Marius Ditch just now- but he's no longer in room 220.  I think he was discharged.

## 2017-05-16 ENCOUNTER — Ambulatory Visit: Payer: BLUE CROSS/BLUE SHIELD

## 2017-05-16 ENCOUNTER — Telehealth: Payer: Self-pay | Admitting: Gastroenterology

## 2017-05-16 MED ORDER — PANCRELIPASE (LIP-PROT-AMYL) 36000-114000 UNITS PO CPEP: ORAL_CAPSULE | ORAL | 0 refills | Status: DC

## 2017-05-16 NOTE — Telephone Encounter (Signed)
Patient left a voice message that he would like to talk to you. Please call

## 2017-05-16 NOTE — Telephone Encounter (Signed)
Dr. Marius Ditch,  Quantavious would like for me to provide him with a copy of the chart note that specifies his liver damage was caused by Lamictal.   Stated that you read it off his [facility] Chart.  I told him I would ask you about it.  Thanks [employer]

## 2017-05-17 NOTE — Telephone Encounter (Signed)
Called Kayla this morning to let him know I was unable to print chart notes from [place].  He said that he was able to download from [facility] but it did not download properly. I informed him that if in the future he should need anything from our office I would be happy to help. He appreciated the call back.  Thanks [employer]

## 2017-05-19 NOTE — Discharge Summary (Signed)
[place] [facility] [place] NAME: Travis Hawkins    MR#:  [id number]  DATE OF BIRTH:  [birth year]  DATE OF ADMISSION:  05/13/2017 ADMITTING PHYSICIAN: Demetrios Loll, MD  DATE OF DISCHARGE: 05/14/2017  6:14 PM  PRIMARY CARE PHYSICIAN: Patient, No Pcp Per   ADMISSION DIAGNOSIS:  Acute pancreatitis, unspecified complication status, unspecified pancreatitis type [K85.90]  DISCHARGE DIAGNOSIS:  Active Problems:   Acute pancreatitis   SECONDARY DIAGNOSIS:   Past Medical History:  Diagnosis Date  . Biliary obstruction   . Depression   . Mood disorder ([place])      ADMITTING HISTORY  HISTORY OF PRESENT ILLNESS:  Travis Hawkins  is a 42 y.o. male with a known history of biliary obstruction, depression and the mood disorder.  The patient was sent to ED from GI physician's office due to above chief complaint.  He has had epigastric abdominal pain for 1 months, which is aching, intermittent, worse at night without radiation.  The patient has a choledochal cholelithiasis, treated with biliary stent in this hospital last December and sent to [facility] for further evaluation.  The patient got additional stent [facility].  He has had abdominal pain since that time.  He was found elevated lipase in GI physician's office.  CAT scan of the abdomen showed acute pancreatitis.  HOSPITAL COURSE:   *Chronic pain from prior pancreatitis.  There was mild increase in lipase but significantly improved from the time he was in [facility].  Discussed with Dr. Bonna Gains of GI.  Suggested discharging patient home on pain medications and follow-up with Dr. Marius Ditch and [facility].  Biliary stent to be removed at a later date.  Patient has tolerated soft diet in the hospital pain much improved with oral pain medications.  Prescription given and discharged home in stable condition.  CONSULTS OBTAINED:  Treatment Team:  Virgel Manifold, MD  DRUG ALLERGIES:   Allergies  Allergen Reactions  . Lamotrigine  Other (See Comments)    DILI 2018    DISCHARGE MEDICATIONS:   Allergies as of 05/14/2017      Reactions   Lamotrigine Other (See Comments)   DILI 2018      Medication List    TAKE these medications   HYDROcodone-acetaminophen 5-325 MG tablet Commonly known as:  NORCO/VICODIN Take 1 tablet by mouth every 8 (eight) hours as needed for severe pain.   ZYPREXA 10 MG tablet Generic drug:  OLANZapine Take 10 mg by mouth at bedtime.       Today   VITAL SIGNS:  Blood pressure 129/87, pulse 69, temperature 97.9 F (36.6 C), resp. rate 14, weight 88.3 kg (194 lb 9.6 oz), SpO2 100 %.  I/O:  No intake or output data in the 24 hours ending 05/19/17 1230  PHYSICAL EXAMINATION:  Physical Exam  GENERAL:  42 y.o.-year-old patient lying in the bed with no acute distress.  LUNGS: Normal breath sounds bilaterally, no wheezing, rales,rhonchi or crepitation. No use of accessory muscles of respiration.  CARDIOVASCULAR: S1, S2 normal. No murmurs, rubs, or gallops.  ABDOMEN: Soft, non-tender, non-distended. Bowel sounds present. No organomegaly or mass.  NEUROLOGIC: Moves all 4 extremities. PSYCHIATRIC: The patient is alert and oriented x 3.  SKIN: No obvious rash, lesion, or ulcer.   DATA REVIEW:   CBC Recent Labs  Lab 05/14/17 0707  WBC 6.1  HGB 15.3  HCT 46.0  PLT 213    Chemistries  Recent Labs  Lab 05/13/17 1455 05/14/17 0707  NA 139 140  K 3.7 4.4  CL 104 105  CO2 24 26  GLUCOSE 164* 97  BUN 7 5*  CREATININE 0.90 0.64  CALCIUM 9.5 9.0  AST 36  --   ALT 47  --   ALKPHOS 90  --   BILITOT 1.1  --     Cardiac Enzymes No results for input(s): TROPONINI in the last 168 hours.  Microbiology Results  Results for orders placed or performed during the hospital encounter of 03/13/17  Blood culture (routine x 2)     Status: None   Collection Time: 03/13/17  6:45 PM  Result Value Ref Range Status   Specimen Description BLOOD LEFT AC  Final   Special Requests    Final    BOTTLES DRAWN AEROBIC AND ANAEROBIC Blood Culture results may not be optimal due to an excessive volume of blood received in culture bottles   Culture NO GROWTH 5 DAYS  Final   Report Status 03/18/2017 FINAL  Final  Blood culture (routine x 2)     Status: None   Collection Time: 03/13/17  6:45 PM  Result Value Ref Range Status   Specimen Description BLOOD LEFT FA  Final   Special Requests   Final    BOTTLES DRAWN AEROBIC AND ANAEROBIC Blood Culture adequate volume   Culture NO GROWTH 5 DAYS  Final   Report Status 03/18/2017 FINAL  Final    RADIOLOGY:  No results found.  Follow up with PCP in 1 week.  Management plans discussed with the patient, family and they are in agreement.  CODE STATUS:  Code Status History    Date Active Date Inactive Code Status Order ID Comments User Context   05/13/2017 19:56 05/14/2017 21:14 Full Code [id number]  Demetrios Loll, MD Inpatient   03/13/2017 19:00 03/20/2017 20:30 Full Code [id number]  Loletha Grayer, MD ED   03/09/2017 04:48 03/12/2017 17:16 Full Code [id number]  Saundra Shelling, MD Inpatient      TOTAL TIME TAKING CARE OF THIS PATIENT ON DAY OF DISCHARGE: more than 30 minutes.   Leia Alf Jerie Basford M.D on 05/19/2017 at 12:30 PM  Between 7am to 6pm - Pager - [phone]  After 6pm go to [email or web address] - password [email or web address] [facility]  Office  [phone]  CC: Primary care physician; Patient, No Pcp Per  Note: This dictation was prepared with Dragon dictation along with smaller phrase technology. Any transcriptional errors that result from this process are unintentional.

## 2017-05-20 ENCOUNTER — Other Ambulatory Visit: Payer: Self-pay

## 2017-05-21 ENCOUNTER — Other Ambulatory Visit: Payer: Self-pay

## 2017-05-21 ENCOUNTER — Encounter: Payer: Self-pay | Admitting: Gastroenterology

## 2017-05-21 ENCOUNTER — Ambulatory Visit: Payer: MEDICAID

## 2017-05-21 ENCOUNTER — Ambulatory Visit (INDEPENDENT_AMBULATORY_CARE_PROVIDER_SITE_OTHER): Payer: BLUE CROSS/BLUE SHIELD | Admitting: Gastroenterology

## 2017-05-21 VITALS — BP 135/92 | HR 74 | Temp 98.0°F | Ht 68.0 in | Wt 196.8 lb

## 2017-05-21 DIAGNOSIS — K858 Other acute pancreatitis without necrosis or infection: Secondary | ICD-10-CM

## 2017-05-21 NOTE — Progress Notes (Signed)
135/952 

## 2017-05-21 NOTE — Progress Notes (Signed)
Cephas Darby, MD [address]  [place]  [place], [place] [address]  Main: [phone]  Fax: [phone]    Gastroenterology Consultation  Referring Provider:     No ref. provider found Primary Care Physician:  Patient, No Pcp Per Primary Gastroenterologist:  Dr. Cephas Darby Reason for Consultation:   Cholestatic jaundice        HPI:   Travis Hawkins is a 42 y.o. 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 He underwent ERCP with biliary sphincterotomy and CBD stent placement on 03/11/17. He also had mild acute pancreatitis. However, his Bilirubin did not improve after biliary decompression and was discharged home on 03/12/17. His symptoms persisted and was readmitted on 03/13/17. His LFTs are significantly elevated including TBili/DBili and AP. Surgery and GI were consulted. He had repeat CT abdomen during second admission on 03/13/17 which revealed CBD stent in place and no biliary dilation. He was started on antibiotics. He was evaluated by surgery again and did not recommend cholecystectomy. He underwent repeat ERCP by Dr Allen Norris on 03/19/17 with stent exchange and free flow of bile with no stent obstruction. Given persistent cholestasis, he was further evaluated for intrinsic liver injury including AIH, PBC, PSC, viral hepatitis, HIV, IgG4, immunoglobulins, p-ANCA and liver biopsy. His secondary liver disease work up came back unremarkable. He was discharged home on 03/20/17 on cipro, atarax, urso. His liver biopsy which was performed after discharge revealed MODERATE CHOLESTASIS WITH FOCAL MILD BILE DUCTULAR REACTION,  findings compatible with partial bile duct outflow obstruction and few eosinophils which are possibly due to cholestatic drug reaction.   Interval  history 03/29/17:  Since discharge, I have been in touch with pt and his fiance via phone regularly as he continued to have intense itching, severe constipation and flare up of hemorrhoids. I stopped cipro, swithced from urso to cholestyramine, started him on magnesium citrate, psyllium, mirarlax, fleet enemas and he finally had a BM. He is dealing with flare up of hemorrhoids. He had repeat Labs yesterday which showed improvement in LFTs and he also felt itching is gradually getting better after switching to cholestyramine.   Today, he reports that he continues to feel nauseous, decreased appetite, altered taste, altered smell, lost 20lbs in 1 month, and ongoing pruritus. He is frustrated with ongoing symptoms, weight loss, unable to go back to work. He wants to know when his gallbladder is going to come out.   Apparently, pt said he has been dealing with symptoms for almost a month prior to his initial admission as he was waiting on his insurance. 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 He denies having BM in last 3days. He denies abdominal pain, f/c/ recent abx, NSAID use He is a smoker, denies illicit drug use, ETOH. No new medications He denies taking OTC/herbal supplements  Follow-up visit 05/06/2017: Since last visit, patient admitted to [facility] in 03/2017 for further evaluation of persistent jaundice. He underwent upper EUS which did not reveal any significant abnormality of the pancreas. He subsequently underwent ERCP with stent exchange for mild biliary stricture. He underwent MRCP which did not reveal any discrete pancreatic mass lesion. 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 His LFTs have significantly improved over time. He denies experiencing pruritus. He is no longer on bile acid sequestrants. He is discharged home on Zyprexa. Patient reported that on  the day of ERCP, postprocedure he experienced severe epigastric pain, x-ray abdomen and ruled out perforation and he was discharged home on narcotics. His epigastric pain was under control on narcotic medication. He ran out of this about 10 days ago, has been taking Aleve 2 times a day. He continues to experience mild to moderate epigastric discomfort with no relation to food. He denies fever, chills, nausea, vomiting. He also noticed a small knot in the periumbilical area which is not painful. His appetite is improved, gained about 16 pounds since last visit. He is going back to work on 05/14/2017. He denies any other GI symptoms. He is scheduled to see general surgery at [facility] for evaluation of cholecystectomy. He also has follow-up appointment for stent exchange/removal at [facility] in early March.  Work up so far negative for malignancy. 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 due to increased eosinophils, otherwise bile duct stasis due to choledocho Secondary liver disease work up is negative including HIV, Hep A, B , C, ANA, ASMA, AMA,  IgG4, immunoglobulins, p-ANCA, Ferritin is mildly elevated  Follow-up visit 05/21/2017: Since last visit, patient was admitted to [facility] secondary to ongoing epigastric pain and elevated lipase. He had CT A/P which revealed mild pancreatitis. He was admitted for 24 hours, managed conservatively with bowel rest and pain management. Is discharged on Norco. Patient is currently doing well with minimal epigastric pain and able to tolerate by mouth. He is taking MiraLAX as needed for constipation. He cannot afford Creon. His LFTs were normal during hospital stay. CT did not reveal any biliary ductal dilation. He is requesting to go back to work  NSAIDs: none  Antiplts/Anticoagulants/Anti thrombotics: none  GI Procedures: ERCP 03/11/17 - The major papilla appeared to be bulging. - The common bile duct was moderately dilated, acquired. -  Choledocholithiasis was found. Complete removal was accomplished by biliary sphincterotomy and balloon extraction. - A biliary sphincterotomy was performed. - The biliary tree was swept. - Drainage of bile was slow and would not drain. - One plastic stent was placed into the common bile duct.  ERCP  03/19/17 - One stent was removed from the biliary tree. - The biliary tree was swept and nothing was found. - One plastic stent was placed into the common bile duct. - The previously placed stent appeared to be draining well. Cytology: DIAGNOSIS:  A. BILE DUCT BRUSHING; ERCP:  - ATYPICAL, FAVOR REACTIVE, SEE COMMENT.   US guided liver biopsy 03/22/17 DIAGNOSIS:  A. LIVER; BIOPSY:  - MODERATE CHOLESTASIS WITH FOCAL MILD BILE DUCTULAR REACTION.  - SEE COMMENT.   Comment:  Sections demonstrate several intact cores of hepatic parenchyma with  adequate numbers of portal tracts present for evaluation. Mild  cholestasis is present with mild bile ductular reaction; findings  compatible with partial bile duct outflow obstruction. Bile ducts are  intact, without inflammation or loss. Scattered eosinophils are noted  within portal areas as well as in the lobule. Macrovesicular steatosis  involves 5% of the parenchyma without evidence of hepatic injury. PASD  stain highlights evidence of resolving hepatitic process. PASD  Intrahepatocyte globules are not identified. Stainable iron is not  present. Portal and peri-ductal fibrosis are not identified; trichrome  stain confirms. Stain controls worked appropriately.  The findings are compatible resolving partial bile  duct outflow  obstruction. The patient's recent choledocolithiasis is noted. The  presence of eosinophils raises the possibility of superimposed  cholestatic drug reaction. Review of medications including herbal  supplements is advised.    Past Medical History:  Diagnosis Date  . Biliary obstruction   . Depression   . Mood  disorder [facility])     Past Surgical History:  Procedure Laterality Date  . ENDOSCOPIC RETROGRADE CHOLANGIOPANCREATOGRAPHY (ERCP) WITH PROPOFOL N/A 03/11/2017   Procedure: ENDOSCOPIC RETROGRADE CHOLANGIOPANCREATOGRAPHY (ERCP) WITH PROPOFOL;  Surgeon: Lucilla Lame, MD;  Location: [facility] ENDOSCOPY;  Service: Endoscopy;  Laterality: N/A;  . ERCP N/A 03/19/2017   Procedure: ENDOSCOPIC RETROGRADE CHOLANGIOPANCREATOGRAPHY (ERCP);  Surgeon: Lucilla Lame, MD;  Location: [facility] ENDOSCOPY;  Service: Endoscopy;  Laterality: N/A;  . none       Current Outpatient Medications:  .  HYDROcodone-acetaminophen (NORCO/VICODIN) 5-325 MG tablet, Take 1 tablet by mouth every 8 (eight) hours as needed for severe pain., Disp: 20 tablet, Rfl: 0 .  lipase/protease/amylase (CREON) 36000 UNITS CPEP capsule, Take 2 capsules with each meal and 1 with each snack, Disp: 150 capsule, Rfl: 0 .  OLANZapine (ZYPREXA) 10 MG tablet, Take 10 mg by mouth., Disp: , Rfl:    Family History  Problem Relation Age of Onset  . Cancer Maternal Aunt   . Breast cancer Maternal Grandmother   . Lung cancer Maternal Grandfather   . Diabetes Mellitus II Mother      Social History   Tobacco Use  . Smoking status: Current Every Day Smoker    Packs/day: 1.00    Years: 18.00    Pack years: 18.00    Types: E-cigarettes  . Smokeless tobacco: Never Used  Substance Use Topics  . Alcohol use: No    Frequency: Never    Comment: Occasional wine about every 3-4 months  . Drug use: No    Allergies as of 05/21/2017 - Review Complete 05/21/2017  Allergen Reaction Noted  . Lamotrigine Other (See Comments) 04/09/2017    Review of Systems:    All systems reviewed and negative except where noted in HPI.   Physical Exam:  BP (!) 135/92   Pulse 74   Temp 98 F (36.7 C) (Oral)   Ht 5\' 8"  (1.727 m)   Wt 196 lb 12.8 oz (89.3 kg)   BMI 29.92 kg/m  No LMP for male patient.  General:   Alert,  Well-developed, well-nourished, pleasant and  cooperative in NAD Head:  Normocephalic and atraumatic. Eyes:  Sclera icteric.   Conjunctiva pink. Ears:  Normal auditory acuity. Nose:  No deformity, discharge, or lesions. Mouth:  No deformity or lesions,oropharynx pink & moist. Neck:  Supple; no masses or thyromegaly. Lungs:  Respirations even and unlabored.  Clear throughout to auscultation.   No wheezes, crackles, or rhonchi. No acute distress. Heart:  Regular rate and rhythm; no murmurs, clicks, rubs, or gallops. Abdomen:  Normal bowel sounds. Soft, mild tenderness in the epigastric region, a small collection of fat in the periumbilical area, nontender and non-distended without masses, hepatosplenomegaly or hernias noted.  No guarding or rebound tenderness.   Rectal: Nor performed Msk:  Symmetrical without gross deformities. Good, equal movement & strength bilaterally. Pulses:  Normal pulses noted. Extremities:  No clubbing or edema.  No cyanosis. Neurologic:  Alert and oriented x3;  grossly normal neurologically. Skin:  without significant lesions or rashes. Lymph Nodes:  No significant cervical adenopathy. Psych:  Alert and cooperative. Normal mood and affect.  Imaging Studies: Reviewed  Assessment and Plan:   Travis Hawkins is a 42 y.o. black male with h/o bipolar on zyprexa and lamotrigine with recent h/o choledocholithiasis that resulted in prolonged cholestasis, extra and intrahepatic biliary ductal dilation, s/p ERCP x 2 with biliary sphincterotomy, stone extraction and plastic biliary stent placement, mild biliary stricture, s/p liver biopsy which revealed drug-induced liver injury, recent admission to [facility] secondary to persistently elevated LFTs. He underwent ERCP with CBD stent exchange, developed post-ERCP pancreatitis, seen by transplant hepatology who suggested that he also had drug-induced liver injury secondary to Lamictal. EUS was unremarkable for any focal pancreatic lesions or peripancreatic fluid collections. CT A/P on  05/13/2017 for ongoing epigastric pain revealed mild pancreatitis, there was no evidence of biliary ductal dilation or peripancreatic fluid collections. He is here as a hospital follow-up. Clinically better  Epigastric pain: Secondary to post ERCP pancreatitis - Significantly improved - Continue Norco as needed - Okay to stop Creon  Choledocholithiasis and mild biliary stricture: status post ERCP with biliary sphincterotomy, stone extraction and biliary stent exchange on 04/05/2017 - He scheduled for follow-up with Dr. 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 James Ivanoff, MD

## 2017-06-24 ENCOUNTER — Encounter: Payer: Self-pay | Admitting: *Deleted

## 2017-06-25 ENCOUNTER — Ambulatory Visit: Payer: BLUE CROSS/BLUE SHIELD | Admitting: Anesthesiology

## 2017-06-25 ENCOUNTER — Encounter
Admission: RE | Disposition: A | Discharge status: Home / Self Care | Payer: Self-pay | Source: Ambulatory Visit | Attending: Gastroenterology

## 2017-06-25 ENCOUNTER — Ambulatory Visit: Payer: BLUE CROSS/BLUE SHIELD

## 2017-06-25 ENCOUNTER — Ambulatory Visit
Admission: RE | Admit: 2017-06-25 | Discharge: 2017-06-25 | Disposition: A | Discharge status: Home / Self Care | Payer: BLUE CROSS/BLUE SHIELD | Source: Ambulatory Visit | Attending: Gastroenterology | Admitting: Gastroenterology

## 2017-06-25 ENCOUNTER — Encounter: Payer: Self-pay | Admitting: *Deleted

## 2017-06-25 DIAGNOSIS — Z833 Family history of diabetes mellitus: Secondary | ICD-10-CM | POA: Insufficient documentation

## 2017-06-25 DIAGNOSIS — F329 Major depressive disorder, single episode, unspecified: Secondary | ICD-10-CM | POA: Diagnosis not present

## 2017-06-25 DIAGNOSIS — F39 Unspecified mood [affective] disorder: Secondary | ICD-10-CM | POA: Diagnosis not present

## 2017-06-25 DIAGNOSIS — F17290 Nicotine dependence, other tobacco product, uncomplicated: Secondary | ICD-10-CM | POA: Insufficient documentation

## 2017-06-25 DIAGNOSIS — Z9689 Presence of other specified functional implants: Secondary | ICD-10-CM | POA: Diagnosis not present

## 2017-06-25 DIAGNOSIS — Z79899 Other long term (current) drug therapy: Secondary | ICD-10-CM | POA: Insufficient documentation

## 2017-06-25 DIAGNOSIS — Z4659 Encounter for fitting and adjustment of other gastrointestinal appliance and device: Secondary | ICD-10-CM | POA: Diagnosis not present

## 2017-06-25 HISTORY — PX: ERCP: SHX5425

## 2017-06-25 SURGERY — ERCP, WITH INTERVENTION IF INDICATED
Anesthesia: General

## 2017-06-25 MED ORDER — GLYCOPYRROLATE 0.2 MG/ML IJ SOLN
INTRAMUSCULAR | Status: DC | PRN
  Administered 2017-06-25: 0.1 mg via INTRAVENOUS

## 2017-06-25 MED ORDER — MIDAZOLAM HCL 2 MG/2ML IJ SOLN
INTRAMUSCULAR | Status: AC
  Filled 2017-06-25: qty 4

## 2017-06-25 MED ORDER — PROPOFOL 500 MG/50ML IV EMUL
INTRAVENOUS | Status: DC | PRN
  Administered 2017-06-25: 100 ug/kg/min via INTRAVENOUS

## 2017-06-25 MED ORDER — LIDOCAINE HCL (PF) 2 % IJ SOLN
INTRAMUSCULAR | Status: AC
  Filled 2017-06-25: qty 10

## 2017-06-25 MED ORDER — HYDROMORPHONE HCL 1 MG/ML IJ SOLN
INTRAMUSCULAR | Status: AC
  Filled 2017-06-25: qty 1

## 2017-06-25 MED ORDER — LIDOCAINE HCL (CARDIAC) 20 MG/ML IV SOLN
INTRAVENOUS | Status: DC | PRN
  Administered 2017-06-25: 50 mg via INTRATRACHEAL

## 2017-06-25 MED ORDER — GLYCOPYRROLATE 0.2 MG/ML IJ SOLN
INTRAMUSCULAR | Status: AC
  Filled 2017-06-25: qty 1

## 2017-06-25 MED ORDER — DEXAMETHASONE SODIUM PHOSPHATE 10 MG/ML IJ SOLN
INTRAMUSCULAR | Status: AC
Start: 2017-06-25 — End: 2017-06-25
  Filled 2017-06-25: qty 1

## 2017-06-25 MED ORDER — MIDAZOLAM HCL 2 MG/2ML IJ SOLN
INTRAMUSCULAR | Status: DC | PRN
  Administered 2017-06-25: 4 mg via INTRAVENOUS

## 2017-06-25 MED ORDER — ONDANSETRON HCL 4 MG/2ML IJ SOLN
INTRAMUSCULAR | Status: AC
  Filled 2017-06-25: qty 2

## 2017-06-25 MED ORDER — ONDANSETRON HCL 4 MG/2ML IJ SOLN
INTRAMUSCULAR | Status: DC | PRN
  Administered 2017-06-25: 4 mg via INTRAVENOUS

## 2017-06-25 MED ORDER — PROPOFOL 500 MG/50ML IV EMUL
INTRAVENOUS | Status: AC
  Filled 2017-06-25: qty 50

## 2017-06-25 MED ORDER — DEXAMETHASONE SODIUM PHOSPHATE 10 MG/ML IJ SOLN
INTRAMUSCULAR | Status: DC | PRN
  Administered 2017-06-25: 10 mg via INTRAVENOUS

## 2017-06-25 MED ORDER — PROPOFOL 10 MG/ML IV BOLUS
INTRAVENOUS | Status: DC | PRN
  Administered 2017-06-25: 100 mg via INTRAVENOUS

## 2017-06-25 MED ORDER — SODIUM CHLORIDE 0.9 % IV SOLN
INTRAVENOUS | Status: DC
  Administered 2017-06-25: 1000 mL via INTRAVENOUS

## 2017-06-25 NOTE — Anesthesia Postprocedure Evaluation (Signed)
Anesthesia Post Note  Patient: Travis Hawkins  Procedure(s) Performed: ENDOSCOPIC RETROGRADE CHOLANGIOPANCREATOGRAPHY (ERCP) (N/A )  Patient location during evaluation: Endoscopy Anesthesia Type: General Level of consciousness: awake and alert, oriented and patient cooperative Pain management: satisfactory to patient Vital Signs Assessment: post-procedure vital signs reviewed and stable Respiratory status: spontaneous breathing and respiratory function stable Cardiovascular status: blood pressure returned to baseline and stable Postop Assessment: no headache, no backache, no apparent nausea or vomiting and adequate PO intake Anesthetic complications: no     Last Vitals:  Vitals:   06/25/17 0922 06/25/17 1035  BP: 132/84 122/77  Pulse: 71 78  Resp: 18 11  Temp: (!) 36.2 C (!) 35.7 C  SpO2: 100% 99%    Last Pain:  Vitals:   06/25/17 1035  TempSrc: Tympanic                 Ajai Harville H Miki Labuda

## 2017-06-25 NOTE — Transfer of Care (Signed)
Immediate Anesthesia Transfer of Care Note  Patient: Travis Hawkins  Procedure(s) Performed: ENDOSCOPIC RETROGRADE CHOLANGIOPANCREATOGRAPHY (ERCP) (N/A )  Patient Location: PACU  Anesthesia Type:General  Level of Consciousness: drowsy and patient cooperative  Airway & Oxygen Therapy: Patient Spontanous Breathing  Post-op Assessment: Report given to RN, Post -op Vital signs reviewed and stable and Patient moving all extremities X 4  Post vital signs: Reviewed and stable  Last Vitals:  Vitals:   06/25/17 0922 06/25/17 1035  BP: 132/84 122/77  Pulse: 71 78  Resp: 18 11  Temp: (!) 36.2 C (!) 35.7 C  SpO2: 100% 99%    Last Pain:  Vitals:   06/25/17 1035  TempSrc: Tympanic         Complications: No apparent anesthesia complications

## 2017-06-25 NOTE — Op Note (Signed)
[facility] Gastroenterology Patient Name: Travis Hawkins Procedure Date: 06/25/2017 10:05 AM MRN: [id number] Account #: [id number] Date of Birth: [birth year] Admit Type: Outpatient Age: 42 Room: [facility] ENDO ROOM 4 Gender: Male Note Status: Finalized Procedure:            ERCP Indications:          Stent removal Providers:            Lucilla Lame MD, MD Referring MD:         No Local Md, MD (Referring MD) Medicines:            Propofol per Anesthesia Complications:        No immediate complications. Procedure:            Pre-Anesthesia Assessment:                       - Prior to the procedure, a History and Physical was                        performed, and patient medications and allergies were                        reviewed. The patient's tolerance of previous                        anesthesia was also reviewed. The risks and benefits of                        the procedure and the sedation options and risks were                        discussed with the patient. All questions were                        answered, and informed consent was obtained. Prior                        Anticoagulants: The patient has taken no previous                        anticoagulant or antiplatelet agents. ASA Grade                        Assessment: II - A patient with mild systemic disease.                        After reviewing the risks and benefits, the patient was                        deemed in satisfactory condition to undergo the                        procedure.                       After obtaining informed consent, the scope was passed                        under direct vision. Throughout the procedure, the  patient's blood pressure, pulse, and oxygen saturations                        were monitored continuously. The ERCP was introduced                        through the mouth, and used to inject contrast into and                        used to  inject contrast into the bile duct. The ERCP                        was accomplished without difficulty. The patient                        tolerated the procedure well. Findings:      A biliary stent was visible on the scout film. One covered metal stent       originating in the common bile duct was emerging from the major papilla.       One stent was removed from the common bile duct using a rat-toothed       forceps. The bile duct was deeply cannulated with the 15 mm balloon.       Contrast was injected. I personally interpreted the bile duct images.       There was brisk flow of contrast through the ducts. Image quality was       excellent. Contrast extended to the entire biliary tree. The biliary       tree was otherwise normal. Impression:           - One stent from the common bile duct was seen in the                        major papilla.                       - One stent was removed from the common bile duct. Recommendation:       - Watch for pancreatitis, bleeding, perforation, and                        cholangitis. Procedure Code(s):    --- Professional ---                       [phone], Endoscopic retrograde cholangiopancreatography                        (ERCP); with removal of foreign body(s) or stent(s)                        from biliary/pancreatic duct(s)                       33825, Endoscopic catheterization of the biliary ductal                        system, radiological supervision and interpretation Diagnosis Code(s):    --- Professional ---                       Z46.59, Encounter for fitting and adjustment of other  gastrointestinal appliance and device                       Z96.89, Presence of other specified functional implants CPT copyright 2016 American Medical Association. All rights reserved. The codes documented in this report are preliminary and upon coder review may  be revised to meet current compliance requirements. Lucilla Lame MD,  MD 06/25/2017 10:33:18 AM This report has been signed electronically. Number of Addenda: 0 Note Initiated On: 06/25/2017 10:05 AM      [facility]

## 2017-06-25 NOTE — Anesthesia Preprocedure Evaluation (Signed)
Anesthesia Evaluation  Patient identified by MRN, date of birth, ID band Patient awake    Reviewed: Allergy & Precautions, NPO status , Patient's Chart, lab work & pertinent test results  History of Anesthesia Complications Negative for: history of anesthetic complications  Airway Mallampati: II  TM Distance: >3 FB Neck ROM: Full    Dental no notable dental hx.    Pulmonary neg sleep apnea, neg COPD, Current Smoker,    breath sounds clear to auscultation- rhonchi (-) wheezing      Cardiovascular Exercise Tolerance: Good (-) hypertension(-) CAD, (-) Past MI, (-) Cardiac Stents and (-) CABG  Rhythm:Regular Rate:Normal - Systolic murmurs and - Diastolic murmurs    Neuro/Psych PSYCHIATRIC DISORDERS Depression negative neurological ROS     GI/Hepatic negative GI ROS, Neg liver ROS,   Endo/Other  negative endocrine ROSneg diabetes  Renal/GU negative Renal ROS     Musculoskeletal negative musculoskeletal ROS (+)   Abdominal (+) - obese,   Peds  Hematology negative hematology ROS (+)   Anesthesia Other Findings Past Medical History: No date: Biliary obstruction No date: Depression No date: Mood disorder (HCC)   Reproductive/Obstetrics                             Anesthesia Physical Anesthesia Plan  ASA: II  Anesthesia Plan: General   Post-op Pain Management:    Induction: Intravenous  PONV Risk Score and Plan: 0 and Propofol infusion  Airway Management Planned: Natural Airway  Additional Equipment:   Intra-op Plan:   Post-operative Plan:   Informed Consent: I have reviewed the patients History and Physical, chart, labs and discussed the procedure including the risks, benefits and alternatives for the proposed anesthesia with the patient or authorized representative who has indicated his/her understanding and acceptance.   Dental advisory given  Plan Discussed with: CRNA and  Anesthesiologist  Anesthesia Plan Comments:         Anesthesia Quick Evaluation

## 2017-06-25 NOTE — H&P (Signed)
Lucilla Lame, MD [address] [place],  [address] Phone:[phone] Fax : [phone]  Primary Care Physician:  Patient, No Pcp Per Primary Gastroenterologist:  Dr. Allen Norris  Pre-Procedure History & Physical: HPI:  Travis Hawkins is a 42 y.o. male is here for an ERCP.   Past Medical History:  Diagnosis Date  . Biliary obstruction   . Depression   . Mood disorder [facility])     Past Surgical History:  Procedure Laterality Date  . ENDOSCOPIC RETROGRADE CHOLANGIOPANCREATOGRAPHY (ERCP) WITH PROPOFOL N/A 03/11/2017   Procedure: ENDOSCOPIC RETROGRADE CHOLANGIOPANCREATOGRAPHY (ERCP) WITH PROPOFOL;  Surgeon: Lucilla Lame, MD;  Location: [facility] ENDOSCOPY;  Service: Endoscopy;  Laterality: N/A;  . ERCP N/A 03/19/2017   Procedure: ENDOSCOPIC RETROGRADE CHOLANGIOPANCREATOGRAPHY (ERCP);  Surgeon: Lucilla Lame, MD;  Location: [facility] ENDOSCOPY;  Service: Endoscopy;  Laterality: N/A;  . none      Prior to Admission medications   Medication Sig Start Date End Date Taking? Authorizing Provider  HYDROcodone-acetaminophen (NORCO/VICODIN) 5-325 MG tablet Take 1 tablet by mouth every 8 (eight) hours as needed for severe pain. 05/14/17   Hillary Bow, MD  lipase/protease/amylase (CREON) 36000 UNITS CPEP capsule Take 2 capsules with each meal and 1 with each snack 05/16/17   Vanga, Tally Due, MD  OLANZapine (ZYPREXA) 10 MG tablet Take 10 mg by mouth. 04/11/17   [provider]    Allergies as of 05/08/2017 - Review Complete 05/06/2017  Allergen Reaction Noted  . Lamotrigine Other (See Comments) 04/09/2017    Family History  Problem Relation Age of Onset  . Cancer Maternal Aunt   . Breast cancer Maternal Grandmother   . Lung cancer Maternal Grandfather   . Diabetes Mellitus II Mother     Social History   Socioeconomic History  . Marital status: Divorced    Spouse name: Not on file  . Number of children: Not on file  . Years of education: Not on file  . Highest  education level: Not on file  Social Needs  . Financial resource strain: Not on file  . Food insecurity - worry: Not on file  . Food insecurity - inability: Not on file  . Transportation needs - medical: Not on file  . Transportation needs - non-medical: Not on file  Occupational History  . Not on file  Tobacco Use  . Smoking status: Current Every Day Smoker    Packs/day: 1.00    Years: 18.00    Pack years: 18.00    Types: E-cigarettes  . Smokeless tobacco: Never Used  Substance and Sexual Activity  . Alcohol use: No    Frequency: Never    Comment: Occasional wine about every 3-4 months  . Drug use: No  . Sexual activity: Yes    Partners: Female  Other Topics Concern  . Not on file  Social History Narrative  . Not on file    Review of Systems: See HPI, otherwise negative ROS  Physical Exam: BP 132/84   Pulse 71   Temp (!) 97.1 F (36.2 C) (Oral)   Resp 18   Ht 5\' 8"  (1.727 m)   Wt 200 lb (90.7 kg)   SpO2 100%   BMI 30.41 kg/m  General:   Alert,  pleasant and cooperative in NAD Head:  Normocephalic and atraumatic. Neck:  Supple; no masses or thyromegaly. Lungs:  Clear throughout to auscultation.    Heart:  Regular rate and rhythm. Abdomen:  Soft, nontender and nondistended. Normal bowel sounds, without guarding, and  without rebound.   Neurologic:  Alert and  oriented x4;  grossly normal neurologically.  Impression/Plan: Travis Hawkins is here for an ERCP to be performed for stent removal  Risks, benefits, limitations, and alternatives regarding  ERCP have been reviewed with the patient.  Questions have been answered.  All parties agreeable.   Lucilla Lame, MD  06/25/2017, 10:09 AM

## 2017-06-25 NOTE — Anesthesia Post-op Follow-up Note (Signed)
Anesthesia QCDR form completed.        

## 2017-06-26 ENCOUNTER — Encounter: Payer: Self-pay | Admitting: Gastroenterology

## 2017-07-15 ENCOUNTER — Other Ambulatory Visit: Payer: Self-pay

## 2017-07-15 ENCOUNTER — Other Ambulatory Visit
Admission: RE | Admit: 2017-07-15 | Discharge: 2017-07-15 | Disposition: A | Discharge status: Home / Self Care | Payer: BLUE CROSS/BLUE SHIELD | Source: Ambulatory Visit | Attending: Gastroenterology | Admitting: Gastroenterology

## 2017-07-15 DIAGNOSIS — K858 Other acute pancreatitis without necrosis or infection: Secondary | ICD-10-CM | POA: Diagnosis present

## 2017-07-15 LAB — CBC WITH DIFFERENTIAL/PLATELET
BASOS ABS: 0.1 10*3/uL (ref 0–0.1)
Basophils Relative: 1 %
EOS PCT: 1 %
Eosinophils Absolute: 0.1 10*3/uL (ref 0–0.7)
HCT: 39.7 % — ABNORMAL LOW (ref 40.0–52.0)
Hemoglobin: 13.3 g/dL (ref 13.0–18.0)
Lymphocytes Relative: 8 %
Lymphs Abs: 0.8 10*3/uL — ABNORMAL LOW (ref 1.0–3.6)
MCH: 31.2 pg (ref 26.0–34.0)
MCHC: 33.6 g/dL (ref 32.0–36.0)
MCV: 92.8 fL (ref 80.0–100.0)
MONO ABS: 0.9 10*3/uL (ref 0.2–1.0)
Monocytes Relative: 9 %
Neutro Abs: 8.4 10*3/uL — ABNORMAL HIGH (ref 1.4–6.5)
Neutrophils Relative %: 81 %
PLATELETS: 231 10*3/uL (ref 150–440)
RBC: 4.28 MIL/uL — AB (ref 4.40–5.90)
RDW: 13.9 % (ref 11.5–14.5)
WBC: 10.3 10*3/uL (ref 3.8–10.6)

## 2017-07-15 LAB — COMPREHENSIVE METABOLIC PANEL
ALBUMIN: 4.1 g/dL (ref 3.5–5.0)
ALK PHOS: 316 U/L — AB (ref 38–126)
ALT: 354 U/L — ABNORMAL HIGH (ref 17–63)
ANION GAP: 9 (ref 5–15)
AST: 112 U/L — ABNORMAL HIGH (ref 15–41)
BILIRUBIN TOTAL: 3.4 mg/dL — AB (ref 0.3–1.2)
BUN: 11 mg/dL (ref 6–20)
CALCIUM: 8.8 mg/dL — AB (ref 8.9–10.3)
CO2: 23 mmol/L (ref 22–32)
Chloride: 98 mmol/L — ABNORMAL LOW (ref 101–111)
Creatinine, Ser: 0.77 mg/dL (ref 0.61–1.24)
GFR calc Af Amer: >60 mL/min (ref >60)
GLUCOSE: 178 mg/dL — AB (ref 65–99)
Potassium: 3.7 mmol/L (ref 3.5–5.1)
Sodium: 130 mmol/L — ABNORMAL LOW (ref 135–145)
TOTAL PROTEIN: 8.4 g/dL — AB (ref 6.5–8.1)

## 2017-07-15 LAB — LIPASE, BLOOD: Lipase: 538 U/L — ABNORMAL HIGH (ref 11–51)

## 2017-07-15 NOTE — Progress Notes (Signed)
CBC, CMET, Lipase have been ordered for patient.  He will try to get them done today.  If not today in the am.  Thanks Sharyn Lull

## 2017-07-16 ENCOUNTER — Encounter: Payer: Self-pay | Admitting: Anesthesiology

## 2017-07-16 ENCOUNTER — Telehealth: Payer: Self-pay | Admitting: Gastroenterology

## 2017-07-16 ENCOUNTER — Encounter
Admission: RE | Disposition: A | Discharge status: Home / Self Care | Payer: Self-pay | Source: Ambulatory Visit | Attending: Gastroenterology

## 2017-07-16 ENCOUNTER — Other Ambulatory Visit: Payer: Self-pay

## 2017-07-16 ENCOUNTER — Ambulatory Visit: Payer: BLUE CROSS/BLUE SHIELD

## 2017-07-16 ENCOUNTER — Ambulatory Visit: Payer: BLUE CROSS/BLUE SHIELD | Admitting: Anesthesiology

## 2017-07-16 ENCOUNTER — Ambulatory Visit
Admission: RE | Admit: 2017-07-16 | Discharge: 2017-07-16 | Disposition: A | Discharge status: Home / Self Care | Payer: BLUE CROSS/BLUE SHIELD | Source: Ambulatory Visit | Attending: Gastroenterology | Admitting: Gastroenterology

## 2017-07-16 DIAGNOSIS — K2980 Duodenitis without bleeding: Secondary | ICD-10-CM | POA: Diagnosis not present

## 2017-07-16 DIAGNOSIS — R748 Abnormal levels of other serum enzymes: Secondary | ICD-10-CM

## 2017-07-16 DIAGNOSIS — K831 Obstruction of bile duct: Secondary | ICD-10-CM | POA: Diagnosis not present

## 2017-07-16 DIAGNOSIS — F329 Major depressive disorder, single episode, unspecified: Secondary | ICD-10-CM | POA: Diagnosis not present

## 2017-07-16 DIAGNOSIS — Z87891 Personal history of nicotine dependence: Secondary | ICD-10-CM | POA: Diagnosis not present

## 2017-07-16 HISTORY — PX: ERCP: SHX5425

## 2017-07-16 SURGERY — ERCP, WITH INTERVENTION IF INDICATED
Anesthesia: General

## 2017-07-16 MED ORDER — ONDANSETRON HCL 4 MG/2ML IJ SOLN: 4.0000 mg | Freq: Once | INTRAMUSCULAR | Status: DC | PRN

## 2017-07-16 MED ORDER — PROPOFOL 500 MG/50ML IV EMUL
INTRAVENOUS | Status: DC | PRN
  Administered 2017-07-16: 100 ug/kg/min via INTRAVENOUS

## 2017-07-16 MED ORDER — FENTANYL CITRATE (PF) 100 MCG/2ML IJ SOLN
INTRAMUSCULAR | Status: DC | PRN
  Administered 2017-07-16 (×2): 50 ug via INTRAVENOUS

## 2017-07-16 MED ORDER — PROPOFOL 10 MG/ML IV BOLUS
INTRAVENOUS | Status: DC | PRN
  Administered 2017-07-16: 100 mg via INTRAVENOUS
  Administered 2017-07-16: 50 mg via INTRAVENOUS
  Administered 2017-07-16: 100 mg via INTRAVENOUS

## 2017-07-16 MED ORDER — FENTANYL CITRATE (PF) 100 MCG/2ML IJ SOLN: 25.0000 ug | INTRAMUSCULAR | Status: DC | PRN

## 2017-07-16 MED ORDER — SUCCINYLCHOLINE CHLORIDE 20 MG/ML IJ SOLN
INTRAMUSCULAR | Status: DC | PRN
  Administered 2017-07-16: 100 mg via INTRAVENOUS

## 2017-07-16 MED ORDER — PHENYLEPHRINE HCL 10 MG/ML IJ SOLN
INTRAMUSCULAR | Status: DC | PRN
  Administered 2017-07-16: 200 ug via INTRAVENOUS

## 2017-07-16 MED ORDER — SODIUM CHLORIDE 0.9 % IV SOLN
INTRAVENOUS | Status: DC
  Administered 2017-07-16: 1000 mL via INTRAVENOUS

## 2017-07-16 MED ORDER — LIDOCAINE HCL (CARDIAC) 20 MG/ML IV SOLN
INTRAVENOUS | Status: DC | PRN
  Administered 2017-07-16: 100 mg via INTRAVENOUS

## 2017-07-16 NOTE — Anesthesia Post-op Follow-up Note (Signed)
Anesthesia QCDR form completed.        

## 2017-07-16 NOTE — Op Note (Signed)
[facility] Patient Name: Travis Hawkins Procedure Date: 07/16/2017 11:09 AM MRN: [id number] Account #: [id number] Date of Birth: 1975/07/07 Admit Type: Outpatient Age: 42 Room: [facility] ENDO ROOM 4 Gender: Male Note Status: Finalized Procedure:            ERCP Indications:          Elevated liver enzymes Providers:            Lucilla Lame MD, MD Medicines:            Propofol per Anesthesia, General Anesthesia Complications:        Hypoxia Procedure:            Pre-Anesthesia Assessment:                       - Prior to the procedure, a History and Physical was                        performed, and patient medications and allergies were                        reviewed. The patient's tolerance of previous                        anesthesia was also reviewed. The risks and benefits of                        the procedure and the sedation options and risks were                        discussed with the patient. All questions were                        answered, and informed consent was obtained. Prior                        Anticoagulants: The patient has taken no previous                        anticoagulant or antiplatelet agents. ASA Grade                        Assessment: II - A patient with mild systemic disease.                        After reviewing the risks and benefits, the patient was                        deemed in satisfactory condition to undergo the                        procedure.                       After obtaining informed consent, the scope was passed                        under direct vision. Throughout the procedure, the                        patient's blood pressure, pulse, and  oxygen saturations                        were monitored continuously. The Endosonoscope was                        introduced through the mouth, and used to inject                        contrast into and used to inject contrast into the bile               duct. The ERCP was accomplished without difficulty. The                        patient tolerated the procedure well. Findings:      The scout film was normal. A biliary sphincterotomy had been performed.       The sphincterotomy appeared open. The major papilla was bulging. The       bile duct was deeply cannulated with the short-nosed traction       sphincterotome. Contrast was injected. I personally interpreted the bile       duct images. There was brisk flow of contrast through the ducts. Image       quality was excellent. Contrast extended to the entire biliary tree. The       main bile duct was diffusely dilated, secondary to a stricture. A wire       was passed into the biliary tree. To discover objects, the biliary tree       was swept with a 15 mm balloon starting at the bifurcation. Nothing was       found. One 10 Fr by 7 cm plastic stent with a single external flap and a       single internal flap was placed into the common bile duct. Bile flowed       through the stent. The stent was in good position. This was biopsied       with a cold forceps for histology. Impression:           - Prior biliary endoscopic sphincterotomy appeared open.                       - The major papilla appeared to be bulging it was                        biopsied.                       - The entire main bile duct was dilated, secondary to a                        stricture.                       - The biliary tree was swept and nothing was found.                       - One plastic stent was placed into the common bile                        duct. Recommendation:       - Watch for pancreatitis, bleeding, perforation,  and                        cholangitis.                       - The patient should follow up with [facility] again. Procedure Code(s):    --- Professional ---                       [phone], Endoscopic retrograde cholangiopancreatography                        (ERCP); with placement of  endoscopic stent into biliary                        or pancreatic duct, including pre- and post-dilation                        and guide wire passage, when performed, including                        sphincterotomy, when performed, each stent                       [id number], Endoscopic retrograde cholangiopancreatography                        (ERCP); with biopsy, single or multiple                       29924, Endoscopic catheterization of the biliary ductal                        system, radiological supervision and interpretation Diagnosis Code(s):    --- Professional ---                       R74.8, Abnormal levels of other serum enzymes                       K83.1, Obstruction of bile duct CPT copyright 2016 American Medical Association. All rights reserved. The codes documented in this report are preliminary and upon coder review may  be revised to meet current compliance requirements. Lucilla Lame MD, MD 07/16/2017 12:06:59 PM This report has been signed electronically. Number of Addenda: 0 Note Initiated On: 07/16/2017 11:09 AM      [facility]

## 2017-07-16 NOTE — Telephone Encounter (Signed)
Please revise work note and just put patient was under Dr. Dorothey Baseman care. Take off the disability certuf, Please call patient when you have completed this

## 2017-07-16 NOTE — Anesthesia Postprocedure Evaluation (Signed)
Anesthesia Post Note  Patient: Travis Hawkins  Procedure(s) Performed: ENDOSCOPIC RETROGRADE CHOLANGIOPANCREATOGRAPHY (ERCP) (N/A )  Patient location during evaluation: Endoscopy Anesthesia Type: General Level of consciousness: awake and alert Pain management: pain level controlled Vital Signs Assessment: post-procedure vital signs reviewed and stable Respiratory status: spontaneous breathing, nonlabored ventilation, respiratory function stable and patient connected to nasal cannula oxygen Cardiovascular status: blood pressure returned to baseline and stable Postop Assessment: no apparent nausea or vomiting Anesthetic complications: no     Last Vitals:  Vitals:   07/16/17 1041 07/16/17 1213  BP: 97/63 130/75  Pulse: 91 (!) 103  Resp: 18 (!) 22  Temp: 37.1 C 36.5 C  SpO2: 100% 100%    Last Pain:  Vitals:   07/16/17 1213  TempSrc:   PainSc: O[place] S

## 2017-07-16 NOTE — Telephone Encounter (Signed)
Work note has been revised. Pt notified the note is ready for pick up.

## 2017-07-16 NOTE — Transfer of Care (Signed)
Immediate Anesthesia Transfer of Care Note  Patient: Travis Hawkins  Procedure(s) Performed: ENDOSCOPIC RETROGRADE CHOLANGIOPANCREATOGRAPHY (ERCP) (N/A )  Patient Location: PACU  Anesthesia Type:General  Level of Consciousness: awake and alert   Airway & Oxygen Therapy: Patient Spontanous Breathing and Patient connected to face mask oxygen  Post-op Assessment: Report given to RN and Post -op Vital signs reviewed and stable  Post vital signs: Reviewed and stable  Last Vitals:  Vitals:   07/16/17 1041 07/16/17 1213  BP: 97/63 130/75  Pulse: 91 (!) 103  Resp: 18 (!) 22  Temp: 37.1 C 36.5 C  SpO2: 100% 100%    Last Pain:  Vitals:   07/16/17 1041  TempSrc: Oral  PainSc: 2          Complications: No apparent anesthesia complications

## 2017-07-16 NOTE — Anesthesia Preprocedure Evaluation (Signed)
Anesthesia Evaluation  Patient identified by MRN, date of birth, ID band Patient awake    Reviewed: Allergy & Precautions, NPO status , Patient's Chart, lab work & pertinent test results, reviewed documented beta blocker date and time   Airway Mallampati: II  TM Distance: >3 FB     Dental  (+) Chipped   Pulmonary Current Smoker,           Cardiovascular      Neuro/Psych PSYCHIATRIC DISORDERS Depression    GI/Hepatic   Endo/Other    Renal/GU      Musculoskeletal   Abdominal   Peds  Hematology   Anesthesia Other Findings   Reproductive/Obstetrics                             Anesthesia Physical Anesthesia Plan  ASA: III  Anesthesia Plan: General   Post-op Pain Management:    Induction: Intravenous  PONV Risk Score and Plan:   Airway Management Planned:   Additional Equipment:   Intra-op Plan:   Post-operative Plan:   Informed Consent: I have reviewed the patients History and Physical, chart, labs and discussed the procedure including the risks, benefits and alternatives for the proposed anesthesia with the patient or authorized representative who has indicated his/her understanding and acceptance.       Plan Discussed with: CRNA  Anesthesia Plan Comments:         Anesthesia Quick Evaluation  

## 2017-07-16 NOTE — H&P (Signed)
Lucilla Lame, MD [facility] [address], [place], [place] [address] Phone:[phone] Fax : [phone]  Primary Care Physician:  Lin Landsman, MD Primary Gastroenterologist:  Dr. Allen Norris  Pre-Procedure History & Physical: HPI:  Travis Hawkins is a 42 y.o. male is here for an ERCP.   Past Medical History:  Diagnosis Date  . Biliary obstruction   . Depression   . Mood disorder [facility])     Past Surgical History:  Procedure Laterality Date  . ENDOSCOPIC RETROGRADE CHOLANGIOPANCREATOGRAPHY (ERCP) WITH PROPOFOL N/A 03/11/2017   Procedure: ENDOSCOPIC RETROGRADE CHOLANGIOPANCREATOGRAPHY (ERCP) WITH PROPOFOL;  Surgeon: Lucilla Lame, MD;  Location: [facility] ENDOSCOPY;  Service: Endoscopy;  Laterality: N/A;  . ERCP N/A 03/19/2017   Procedure: ENDOSCOPIC RETROGRADE CHOLANGIOPANCREATOGRAPHY (ERCP);  Surgeon: Lucilla Lame, MD;  [facility] ENDOSCOPY;  Service: Endoscopy;  Laterality: N/A;  . ERCP N/A 06/25/2017   Procedure: ENDOSCOPIC RETROGRADE CHOLANGIOPANCREATOGRAPHY (ERCP);  Surgeon: Lucilla Lame, MD;  Location: [facility] ENDOSCOPY;  Service: Endoscopy;  Laterality: N/A;  . none      Prior to Admission medications   Medication Sig Start Date End Date Taking? Authorizing Provider  HYDROcodone-acetaminophen (NORCO/VICODIN) 5-325 MG tablet Take 1 tablet by mouth every 8 (eight) hours as needed for severe pain. 05/14/17   Hillary Bow, MD  lipase/protease/amylase (CREON) 36000 UNITS CPEP capsule Take 2 capsules with each meal and 1 with each snack 05/16/17   Vanga, Tally Due, MD  OLANZapine (ZYPREXA) 10 MG tablet Take 10 mg by mouth. 04/11/17   [provider]    Allergies as of 07/16/2017 - Review Complete 07/16/2017  Allergen Reaction Noted  . Lamotrigine Other (See Comments) 04/09/2017    Family History  Problem Relation Age of Onset  . Cancer Maternal Aunt   . Breast cancer Maternal Grandmother   . Lung cancer Maternal Grandfather   . Diabetes Mellitus II Mother       Social History   Socioeconomic History  . Marital status: Divorced    Spouse name: Not on file  . Number of children: Not on file  . Years of education: Not on file  . Highest education level: Not on file  Social Needs  . Financial resource strain: Not on file  . Food insecurity - worry: Not on file  . Food insecurity - inability: Not on file  . Transportation needs - medical: Not on file  . Transportation needs - non-medical: Not on file  Occupational History  . Not on file  Tobacco Use  . Smoking status: Former Smoker    Packs/day: 1.00    Years: 18.00    Pack years: 18.00    Types: E-cigarettes  . Smokeless tobacco: Never Used  Substance and Sexual Activity  . Alcohol use: No    Frequency: Never    Comment: Occasional wine about every 3-4 months  . Drug use: No  . Sexual activity: Yes    Partners: Female  Other Topics Concern  . Not on file  Social History Narrative  . Not on file    Review of Systems: See HPI, otherwise negative ROS  Physical Exam: BP 97/63   Pulse 91   Temp 98.7 F (37.1 C) (Oral)   Resp 18   Ht 5\' 8"  (1.727 m)   Wt 200 lb (90.7 kg)   SpO2 100%   BMI 30.41 kg/m  General:   Alert,  pleasant and cooperative in NAD Head:  Normocephalic and atraumatic. Neck:  Supple; no masses or thyromegaly. Lungs:  Clear throughout to auscultation.    Heart:  Regular rate and rhythm. Abdomen:  Soft, nontender and nondistended. Normal bowel sounds, without guarding, and without rebound.   Neurologic:  Alert and  oriented x4;  grossly normal neurologically.  Impression/Plan: Travis Hawkins is here for an ERCP to be performed for increased LFT's  Risks, benefits, limitations, and alternatives regarding  ERCP have been reviewed with the patient.  Questions have been answered.  All parties agreeable.   Lucilla Lame, MD  07/16/2017, 11:09 AM

## 2017-07-16 NOTE — Anesthesia Procedure Notes (Signed)
Procedure Name: Intubation Date/Time: 07/16/2017 11:41 AM Performed by: Philbert Riser, CRNA Pre-anesthesia Checklist: Patient identified, Emergency Drugs available, Suction available and Patient being monitored Patient Re-evaluated:Patient Re-evaluated prior to induction Oxygen Delivery Method: Circle system utilized Preoxygenation: Pre-oxygenation with 100% oxygen Induction Type: IV induction Ventilation: Mask ventilation without difficulty and Oral airway inserted - appropriate to patient size Laryngoscope Size: McGraph and 4 Grade View: Grade I Tube type: Oral Tube size: 7.5 mm Number of attempts: 1 Secured at: 22 cm Tube secured with: Tape Dental Injury: Teeth and Oropharynx as per pre-operative assessment

## 2017-07-17 ENCOUNTER — Encounter: Payer: Self-pay | Admitting: Gastroenterology

## 2017-07-17 LAB — SURGICAL PATHOLOGY

## 2017-07-18 ENCOUNTER — Telehealth: Payer: Self-pay | Admitting: Gastroenterology

## 2017-07-18 ENCOUNTER — Emergency Department
Admission: EM | Admit: 2017-07-18 | Discharge: 2017-07-18 | Disposition: A | Discharge status: Home / Self Care | Payer: BLUE CROSS/BLUE SHIELD | Attending: Emergency Medicine | Admitting: Emergency Medicine

## 2017-07-18 ENCOUNTER — Other Ambulatory Visit: Payer: Self-pay

## 2017-07-18 ENCOUNTER — Emergency Department: Payer: BLUE CROSS/BLUE SHIELD

## 2017-07-18 ENCOUNTER — Encounter: Payer: Self-pay | Admitting: Emergency Medicine

## 2017-07-18 DIAGNOSIS — Z87891 Personal history of nicotine dependence: Secondary | ICD-10-CM | POA: Diagnosis not present

## 2017-07-18 DIAGNOSIS — Z79899 Other long term (current) drug therapy: Secondary | ICD-10-CM | POA: Insufficient documentation

## 2017-07-18 DIAGNOSIS — R1011 Right upper quadrant pain: Secondary | ICD-10-CM | POA: Diagnosis present

## 2017-07-18 LAB — COMPREHENSIVE METABOLIC PANEL
ALK PHOS: 293 U/L — AB (ref 38–126)
ALT: 141 U/L — AB (ref 17–63)
ANION GAP: 8 (ref 5–15)
AST: 39 U/L (ref 15–41)
Albumin: 3.8 g/dL (ref 3.5–5.0)
BILIRUBIN TOTAL: 1.1 mg/dL (ref 0.3–1.2)
BUN: 8 mg/dL (ref 6–20)
CALCIUM: 8.9 mg/dL (ref 8.9–10.3)
CO2: 25 mmol/L (ref 22–32)
CREATININE: 0.6 mg/dL — AB (ref 0.61–1.24)
Chloride: 104 mmol/L (ref 101–111)
GFR calc Af Amer: >60 mL/min (ref >60)
GFR calc non Af Amer: >60 mL/min (ref >60)
GLUCOSE: 275 mg/dL — AB (ref 65–99)
Potassium: 4.1 mmol/L (ref 3.5–5.1)
Sodium: 137 mmol/L (ref 135–145)
TOTAL PROTEIN: 7.6 g/dL (ref 6.5–8.1)

## 2017-07-18 LAB — URINALYSIS, COMPLETE (UACMP) WITH MICROSCOPIC
Bacteria, UA: NONE SEEN
Bilirubin Urine: NEGATIVE
GLUCOSE, UA: 50 mg/dL — AB
Hgb urine dipstick: NEGATIVE
KETONES UR: NEGATIVE mg/dL
Leukocytes, UA: NEGATIVE
NITRITE: NEGATIVE
PH: 6 (ref 5.0–8.0)
PROTEIN: NEGATIVE mg/dL
Specific Gravity, Urine: 1.009 (ref 1.005–1.030)
Squamous Epithelial / LPF: NONE SEEN
WBC, UA: NONE SEEN WBC/hpf (ref 0–5)

## 2017-07-18 LAB — CBC
HCT: 38.4 % — ABNORMAL LOW (ref 40.0–52.0)
Hemoglobin: 12.6 g/dL — ABNORMAL LOW (ref 13.0–18.0)
MCH: 31 pg (ref 26.0–34.0)
MCHC: 32.9 g/dL (ref 32.0–36.0)
MCV: 94.2 fL (ref 80.0–100.0)
Platelets: 254 10*3/uL (ref 150–440)
RBC: 4.07 MIL/uL — ABNORMAL LOW (ref 4.40–5.90)
RDW: 14 % (ref 11.5–14.5)
WBC: 6.1 10*3/uL (ref 3.8–10.6)

## 2017-07-18 LAB — LIPASE, BLOOD: Lipase: 389 U/L — ABNORMAL HIGH (ref 11–51)

## 2017-07-18 MED ORDER — HYDROCODONE-ACETAMINOPHEN 5-325 MG PO TABS: 1.0000 | ORAL_TABLET | Freq: Four times a day (QID) | ORAL | 0 refills | Status: DC | PRN

## 2017-07-18 MED ORDER — MORPHINE SULFATE (PF) 4 MG/ML IV SOLN
4.0000 mg | Freq: Once | INTRAVENOUS | Status: AC
  Administered 2017-07-18: 4 mg via INTRAVENOUS
  Filled 2017-07-18: qty 1

## 2017-07-18 MED ORDER — ONDANSETRON HCL 4 MG/2ML IJ SOLN
4.0000 mg | Freq: Once | INTRAMUSCULAR | Status: AC
  Administered 2017-07-18: 4 mg via INTRAVENOUS
  Filled 2017-07-18: qty 2

## 2017-07-18 MED ORDER — SODIUM CHLORIDE 0.9 % IV BOLUS (SEPSIS)
1000.0000 mL | Freq: Once | INTRAVENOUS | Status: AC
  Administered 2017-07-18: 1000 mL via INTRAVENOUS

## 2017-07-18 MED ORDER — ONDANSETRON HCL 4 MG PO TABS: 4.0000 mg | ORAL_TABLET | Freq: Three times a day (TID) | ORAL | 0 refills | Status: AC | PRN

## 2017-07-18 NOTE — Telephone Encounter (Signed)
Patient called and stated in was in severe pain. Per Ginger, he was advised to go to the ER.

## 2017-07-18 NOTE — ED Triage Notes (Signed)
Pt had liver stent removed 3 weeks ago, states pain began again yesterday, increasing pain today and Dr told pt to come here with possible pancreatitis. Is to have his gall bladder removed April 12.

## 2017-07-18 NOTE — Telephone Encounter (Signed)
Pt is calling he is at the ER he wants to know if the ER contacted Dr. Allen Norris and he wants to know what is wrong with his body and he said he caint miss any more work he needs an income if someone could please call him  cb [phone]

## 2017-07-18 NOTE — Discharge Instructions (Signed)
Return to the emergency department for new, worsening, or persistent severe abdominal pain, vomiting, fevers, weakness, or any other new or worsening symptoms that concern you.  You should follow-up with a gastroenterologist, either with Dr. Marijean Bravo. Marius Ditch or with [facility] gastroenterology as you may prefer.  We have provided the contact information for [facility] gastroenterology.

## 2017-07-18 NOTE — ED Provider Notes (Addendum)
[facility] Emergency Department Provider Note ____________________________________________   None    (approximate)  I have reviewed the triage vital signs and the nursing notes.   HISTORY  Chief Complaint Abdominal Pain    HPI Travis Hawkins is a 42 y.o. male with past medical history as noted below including recurrent biliary obstruction status post ERCP and biliary stenting 2 days ago who presents with worsening the right upper quadrant abdominal pain over the last day, gradual onset, associated with generalized weakness and fatigue, but not associated with nausea, vomiting, fever, or change in bowel movements.  Patient states that 2 days ago after the procedure was done and yesterday his pain was under control but it got acutely worse last night into this morning.  He called the office of Dr. Allen Norris who performed the procedure, and was told to come to the emergency department.   Past Medical History:  Diagnosis Date  . Biliary obstruction   . Depression   . Mood disorder [facility])     Patient Active Problem List   Diagnosis Date Noted  . Obstruction of bile duct   . Encounter for fit/adjst of GI appliance and device   . Presence of other specified functional implants   . Cholestasis 04/03/2017  . Elevated LFTs 04/03/2017  . Jaundice, hepatocellular   . Encounter for fitting and adjustment of other gastrointestinal appliance and device   . Acute acalculous cholecystitis 03/13/2017  . Acute pancreatitis   . Transaminitis   . Calculus of bile duct without cholecystitis and without obstruction   . Other specified diseases of biliary tract   . Elevated liver enzymes   . Biliary obstruction 03/09/2017    Past Surgical History:  Procedure Laterality Date  . ENDOSCOPIC RETROGRADE CHOLANGIOPANCREATOGRAPHY (ERCP) WITH PROPOFOL N/A 03/11/2017   Procedure: ENDOSCOPIC RETROGRADE CHOLANGIOPANCREATOGRAPHY (ERCP) WITH PROPOFOL;  Surgeon: Lucilla Lame, MD;   Location: [facility] ENDOSCOPY;  Service: Endoscopy;  Laterality: N/A;  . ERCP N/A 03/19/2017   Procedure: ENDOSCOPIC RETROGRADE CHOLANGIOPANCREATOGRAPHY (ERCP);  Surgeon: Lucilla Lame, MD;  Location: [facility] ENDOSCOPY;  Service: Endoscopy;  Laterality: N/A;  . ERCP N/A 06/25/2017   Procedure: ENDOSCOPIC RETROGRADE CHOLANGIOPANCREATOGRAPHY (ERCP);  Surgeon: Lucilla Lame, MD;  Location: [facility] ENDOSCOPY;  Service: Endoscopy;  Laterality: N/A;  . ERCP N/A 07/16/2017   Procedure: ENDOSCOPIC RETROGRADE CHOLANGIOPANCREATOGRAPHY (ERCP);  Surgeon: Lucilla Lame, MD;  Location: [facility] ENDOSCOPY;  Service: Endoscopy;  Laterality: N/A;  . none      Prior to Admission medications   Medication Sig Start Date End Date Taking? Authorizing Provider  OLANZapine (ZYPREXA) 10 MG tablet Take 10 mg by mouth. 04/11/17  Yes [provider]  HYDROcodone-acetaminophen (NORCO/VICODIN) 5-325 MG tablet Take 1 tablet by mouth every 6 (six) hours as needed for moderate pain. 07/18/17   Arta Silence, MD  lipase/protease/amylase (CREON) 36000 UNITS CPEP capsule Take 2 capsules with each meal and 1 with each snack Patient not taking: Reported on 07/18/2017 05/16/17   Lin Landsman, MD  ondansetron (ZOFRAN) 4 MG tablet Take 1 tablet (4 mg total) by mouth every 8 (eight) hours as needed for up to 7 days for nausea or vomiting. 07/18/17 07/25/17  Arta Silence, MD    Allergies Lamotrigine  Family History  Problem Relation Age of Onset  . Cancer Maternal Aunt   . Breast cancer Maternal Grandmother   . Lung cancer Maternal Grandfather   . Diabetes Mellitus II Mother     Social History Social History   Tobacco Use  .  Smoking status: Former Smoker    Packs/day: 1.00    Years: 18.00    Pack years: 18.00    Types: E-cigarettes  . Smokeless tobacco: Never Used  Substance Use Topics  . Alcohol use: No    Frequency: Never    Comment: Occasional wine about every 3-4 months  . Drug use: No    Review of  Systems  Constitutional: No fever/chills. Eyes: No jaundice. ENT: No sore throat. Cardiovascular: Denies chest pain. Respiratory: Denies shortness of breath. Gastrointestinal: No positive for abdominal pain.  Genitourinary: Negative for flank pain.  Musculoskeletal: Negative for back pain. Skin: Negative for rash. Neurological: Negative for headache.   ____________________________________________   PHYSICAL EXAM:  VITAL SIGNS: ED Triage Vitals [07/18/17 1014]  Enc Vitals Group     BP 124/73     Pulse Rate 67     Resp 20     Temp 98.3 F (36.8 C)     Temp Source Oral     SpO2 100 %     Weight 200 lb (90.7 kg)     Height _0  (1.727 m)     Head Circumference      Peak Flow      Pain Score 9     Pain Loc      Pain Edu?      Excl. in [place]?     Constitutional: Alert and oriented. Well appearing and in no acute distress. Eyes: Conjunctivae are normal.  No scleral icterus Head: Atraumatic. Nose: No congestion/rhinnorhea. Mouth/Throat: Mucous membranes are moist.   Neck: Normal range of motion.  Cardiovascular:  Good peripheral circulation. Respiratory: Normal respiratory effort.   Gastrointestinal: Soft with mild right upper quadrant tenderness. No distention.  Genitourinary: No flank tenderness. Musculoskeletal:  Extremities warm and well perfused.  Neurologic:  Normal speech and language. No gross focal neurologic deficits are appreciated.  Skin:  Skin is warm and dry. No rash noted. Psychiatric: Mood and affect are normal. Speech and behavior are normal.  ____________________________________________   LABS (all labs ordered are listed, but only abnormal results are displayed)  Labs Reviewed  LIPASE, BLOOD - Abnormal; Notable for the following components:      Result Value   Lipase 389 (*)    All other components within normal limits  COMPREHENSIVE METABOLIC PANEL - Abnormal; Notable for the following components:   Glucose, Bld 275 (*)    Creatinine, Ser  0.60 (*)    ALT 141 (*)    Alkaline Phosphatase 293 (*)    All other components within normal limits  CBC - Abnormal; Notable for the following components:   RBC 4.07 (*)    Hemoglobin 12.6 (*)    HCT 38.4 (*)    All other components within normal limits  URINALYSIS, COMPLETE (UACMP) WITH MICROSCOPIC - Abnormal; Notable for the following components:   Color, Urine YELLOW (*)    APPearance CLEAR (*)    Glucose, UA 50 (*)    All other components within normal limits   ____________________________________________  EKG  ED ECG REPORT I, Arta Silence, the attending physician, personally viewed and interpreted this ECG.  Date: 07/18/2017 EKG Time: 1019 Rate: 72 Rhythm: normal sinus rhythm QRS Axis: normal Intervals: normal ST/T Wave abnormalities: normal Narrative Interpretation: no evidence of acute ischemia  ____________________________________________  RADIOLOGY  CXR: No focal infiltrate ____________________________________________   PROCEDURES  Procedure(s) performed: No  Procedures  Critical Care performed: No ____________________________________________   INITIAL IMPRESSION / ASSESSMENT AND PLAN /  ED COURSE  Pertinent labs & imaging results that were available during my care of the patient were reviewed by me and considered in my medical decision making (see chart for details).  42 year old male with past medical history as noted above including recurrent biliary obstruction status post ERCP and biliary stenting 2 days ago presents with worsening right upper quadrant abdominal pain over the last day.  Patient states that it been under control after the procedure but then worsened.  He states that he was supposed to go back to work today but felt that he could not.  He was told to come into the emergency department by Dr. Allen Norris who performed the procedure.  I reviewed the past medical records in Epic and confirmed the history of the ERCP by Dr. Allen Norris 2 days  ago with biliary stenting.  On exam, the patient is relatively well-appearing, vital signs are normal, and the remainder the exam is as described above with some right upper quadrant abdominal tenderness.  Initial labs reveal elevated alk phos and lipase, however these are both improved from labs obtained the day prior to the procedure.  Overall presentation is most consistent with postprocedural pancreatitis versus other postprocedural pain.  We will give analgesia, fluids, and I will consult GI for further recommendations.  Clinical Course as of Jul 18 2053  Thu Jul 18, 2017  1641 Comprehensive metabolic panel(!) [SS]    Clinical Course User Index [SS] Arta Silence, MD   ----------------------------------------- 4:39 PM on 07/18/2017 -----------------------------------------  I consulted Dr. Allen Norris from gastroenterology who performed the procedure on the patient 2 days ago.  We discussed the patient's clinical picture and he is lab workup.  Dr. Allen Norris mentioned that there was an episode during the procedure where the patient had worsening of his respiratory status and needed to be intubated so he suggested to obtain a chest x-ray to rule out right lower lobe pneumonia or atelectasis.  He advised that otherwise, there was no evidence based on the clinical picture for any acute complication, and given that the labs are trending in the right direction the patient is safe for discharge home if his pain is controlled.  Chest x-ray was obtained and is negative.  The patient reports significant relief in his pain, and feels comfortable to go home.  He is tolerating p.o.  The patient expressed a wish to follow-up at gastroenterology at [facility], so I gave him this information in the discharge paperwork.  Return precautions given, and the patient expresses understanding.  ____________________________________________   FINAL CLINICAL IMPRESSION(S) / ED DIAGNOSES  Final diagnoses:  Right upper  quadrant abdominal pain      NEW MEDICATIONS STARTED DURING THIS VISIT:  Discharge Medication List as of 07/18/2017  4:39 PM    START taking these medications   Details  ondansetron (ZOFRAN) 4 MG tablet Take 1 tablet (4 mg total) by mouth every 8 (eight) hours as needed for up to 7 days for nausea or vomiting., Starting Thu 07/18/2017, Until Thu 07/25/2017, Print         Note:  This document was prepared using Dragon voice recognition software and may include unintentional dictation errors.     Arta Silence, MD 07/18/17 1641    Arta Silence, MD 07/18/17 2055    Arta Silence, MD 08/05/17 2249

## 2017-07-18 NOTE — ED Notes (Signed)
Patient transported to X-ray 

## 2017-07-19 ENCOUNTER — Telehealth: Payer: Self-pay | Admitting: Gastroenterology

## 2017-07-19 NOTE — Progress Notes (Signed)
Patient has been informed of the above message.  Informed him his referral has been sent to [facility].  He said he's just glad he does not have cancer.

## 2017-07-19 NOTE — Telephone Encounter (Signed)
Dr. Allen Norris received a call from [facility] ED physician to discuss this pt and his care.

## 2017-07-19 NOTE — Telephone Encounter (Signed)
Pt called to speak with Sharyn Lull

## 2017-07-22 DIAGNOSIS — F319 Bipolar disorder, unspecified: Secondary | ICD-10-CM | POA: Insufficient documentation

## 2017-08-12 ENCOUNTER — Encounter: Payer: Self-pay | Admitting: Emergency Medicine

## 2017-08-12 ENCOUNTER — Other Ambulatory Visit: Payer: Self-pay

## 2017-08-12 DIAGNOSIS — R1013 Epigastric pain: Secondary | ICD-10-CM | POA: Diagnosis not present

## 2017-08-12 DIAGNOSIS — Z888 Allergy status to other drugs, medicaments and biological substances status: Secondary | ICD-10-CM

## 2017-08-12 DIAGNOSIS — F319 Bipolar disorder, unspecified: Secondary | ICD-10-CM | POA: Diagnosis present

## 2017-08-12 DIAGNOSIS — Z9049 Acquired absence of other specified parts of digestive tract: Secondary | ICD-10-CM

## 2017-08-12 DIAGNOSIS — K861 Other chronic pancreatitis: Secondary | ICD-10-CM | POA: Diagnosis present

## 2017-08-12 DIAGNOSIS — Z87891 Personal history of nicotine dependence: Secondary | ICD-10-CM

## 2017-08-12 DIAGNOSIS — Z9689 Presence of other specified functional implants: Secondary | ICD-10-CM | POA: Diagnosis present

## 2017-08-12 DIAGNOSIS — K859 Acute pancreatitis without necrosis or infection, unspecified: Principal | ICD-10-CM | POA: Diagnosis present

## 2017-08-12 LAB — COMPREHENSIVE METABOLIC PANEL
ALT: 26 U/L (ref 17–63)
AST: 28 U/L (ref 15–41)
Albumin: 3.8 g/dL (ref 3.5–5.0)
Alkaline Phosphatase: 153 U/L — ABNORMAL HIGH (ref 38–126)
Anion gap: 9 (ref 5–15)
BUN: 6 mg/dL (ref 6–20)
CHLORIDE: 102 mmol/L (ref 101–111)
CO2: 25 mmol/L (ref 22–32)
Calcium: 9.1 mg/dL (ref 8.9–10.3)
Creatinine, Ser: 0.71 mg/dL (ref 0.61–1.24)
GFR calc Af Amer: >60 mL/min (ref >60)
Glucose, Bld: 190 mg/dL — ABNORMAL HIGH (ref 65–99)
Potassium: 3.9 mmol/L (ref 3.5–5.1)
SODIUM: 136 mmol/L (ref 135–145)
Total Bilirubin: 0.3 mg/dL (ref 0.3–1.2)
Total Protein: 8.3 g/dL — ABNORMAL HIGH (ref 6.5–8.1)

## 2017-08-12 LAB — URINALYSIS, COMPLETE (UACMP) WITH MICROSCOPIC
Bilirubin Urine: NEGATIVE
Glucose, UA: NEGATIVE mg/dL
Hgb urine dipstick: NEGATIVE
KETONES UR: NEGATIVE mg/dL
LEUKOCYTES UA: NEGATIVE
Nitrite: NEGATIVE
PROTEIN: NEGATIVE mg/dL
SQUAMOUS EPITHELIAL / LPF: NONE SEEN
Specific Gravity, Urine: 1.012 (ref 1.005–1.030)
pH: 6 (ref 5.0–8.0)

## 2017-08-12 LAB — CBC
HEMATOCRIT: 36 % — AB (ref 40.0–52.0)
Hemoglobin: 12.4 g/dL — ABNORMAL LOW (ref 13.0–18.0)
MCH: 31.3 pg (ref 26.0–34.0)
MCHC: 34.3 g/dL (ref 32.0–36.0)
MCV: 91.2 fL (ref 80.0–100.0)
Platelets: 380 10*3/uL (ref 150–440)
RBC: 3.95 MIL/uL — ABNORMAL LOW (ref 4.40–5.90)
RDW: 13 % (ref 11.5–14.5)
WBC: 13.9 10*3/uL — AB (ref 3.8–10.6)

## 2017-08-12 LAB — LIPASE, BLOOD: LIPASE: 302 U/L — AB (ref 11–51)

## 2017-08-12 NOTE — ED Notes (Signed)
Ems pt to lobby , abd pain , recent gallbladder removed, hx of Pancreatitis

## 2017-08-12 NOTE — ED Triage Notes (Signed)
Pt to triage via w/c with no distress noted; pt reports generalized abd pain with hx of same; recently d/c from [facility] for cholecysteomy and stent placement; st "nothing seems to help the pain go away"; st has f/u appt tomorrow

## 2017-08-13 ENCOUNTER — Emergency Department: Payer: BLUE CROSS/BLUE SHIELD

## 2017-08-13 ENCOUNTER — Inpatient Hospital Stay
Admission: EM | Admit: 2017-08-13 | Discharge: 2017-08-17 | DRG: 440 | Disposition: A | Discharge status: Home / Self Care | Payer: BLUE CROSS/BLUE SHIELD | Attending: Internal Medicine | Admitting: Internal Medicine

## 2017-08-13 ENCOUNTER — Encounter: Payer: Self-pay | Admitting: Radiology

## 2017-08-13 ENCOUNTER — Other Ambulatory Visit: Payer: Self-pay

## 2017-08-13 DIAGNOSIS — R1013 Epigastric pain: Secondary | ICD-10-CM

## 2017-08-13 DIAGNOSIS — K859 Acute pancreatitis without necrosis or infection, unspecified: Secondary | ICD-10-CM

## 2017-08-13 DIAGNOSIS — Z9049 Acquired absence of other specified parts of digestive tract: Secondary | ICD-10-CM | POA: Diagnosis not present

## 2017-08-13 DIAGNOSIS — Z9689 Presence of other specified functional implants: Secondary | ICD-10-CM | POA: Diagnosis present

## 2017-08-13 DIAGNOSIS — Z888 Allergy status to other drugs, medicaments and biological substances status: Secondary | ICD-10-CM | POA: Diagnosis not present

## 2017-08-13 DIAGNOSIS — K851 Biliary acute pancreatitis without necrosis or infection: Secondary | ICD-10-CM | POA: Diagnosis not present

## 2017-08-13 DIAGNOSIS — R52 Pain, unspecified: Secondary | ICD-10-CM

## 2017-08-13 DIAGNOSIS — Z87891 Personal history of nicotine dependence: Secondary | ICD-10-CM | POA: Diagnosis not present

## 2017-08-13 DIAGNOSIS — F319 Bipolar disorder, unspecified: Secondary | ICD-10-CM | POA: Diagnosis present

## 2017-08-13 DIAGNOSIS — K861 Other chronic pancreatitis: Secondary | ICD-10-CM | POA: Diagnosis present

## 2017-08-13 LAB — TRIGLYCERIDES: TRIGLYCERIDES: 451 mg/dL — AB (ref <150)

## 2017-08-13 MED ORDER — LORAZEPAM 2 MG/ML IJ SOLN
0.5000 mg | Freq: Once | INTRAMUSCULAR | Status: AC
  Administered 2017-08-13: 0.5 mg via INTRAVENOUS
  Filled 2017-08-13: qty 1

## 2017-08-13 MED ORDER — HYDROCODONE-ACETAMINOPHEN 5-325 MG PO TABS: 1.0000 | ORAL_TABLET | ORAL | Status: DC | PRN

## 2017-08-13 MED ORDER — LORATADINE 10 MG PO TABS
10.0000 mg | ORAL_TABLET | Freq: Every day | ORAL | Status: DC
  Administered 2017-08-13 – 2017-08-17 (×5): 10 mg via ORAL
  Filled 2017-08-13 (×5): qty 1

## 2017-08-13 MED ORDER — MORPHINE SULFATE (PF) 2 MG/ML IV SOLN
2.0000 mg | INTRAVENOUS | Status: DC | PRN
  Administered 2017-08-13 (×3): 2 mg via INTRAVENOUS
  Filled 2017-08-13 (×3): qty 1

## 2017-08-13 MED ORDER — FENTANYL CITRATE (PF) 100 MCG/2ML IJ SOLN
50.0000 ug | Freq: Once | INTRAMUSCULAR | Status: AC
  Administered 2017-08-13: 50 ug via INTRAVENOUS
  Filled 2017-08-13: qty 2

## 2017-08-13 MED ORDER — PANCRELIPASE (LIP-PROT-AMYL) 12000-38000 UNITS PO CPEP
36000.0000 [IU] | ORAL_CAPSULE | Freq: Three times a day (TID) | ORAL | Status: DC
  Administered 2017-08-14 – 2017-08-17 (×8): 36000 [IU] via ORAL
  Filled 2017-08-13 (×8): qty 3

## 2017-08-13 MED ORDER — HEPARIN SODIUM (PORCINE) 5000 UNIT/ML IJ SOLN
5000.0000 [IU] | Freq: Three times a day (TID) | INTRAMUSCULAR | Status: DC
  Administered 2017-08-16: 14:00:00 5000 [IU] via SUBCUTANEOUS
  Filled 2017-08-13 (×5): qty 1

## 2017-08-13 MED ORDER — ONDANSETRON HCL 4 MG/2ML IJ SOLN
4.0000 mg | Freq: Once | INTRAMUSCULAR | Status: AC
  Administered 2017-08-13: 4 mg via INTRAVENOUS
  Filled 2017-08-13: qty 2

## 2017-08-13 MED ORDER — SODIUM CHLORIDE 0.9 % IV SOLN
INTRAVENOUS | Status: DC
  Administered 2017-08-13 – 2017-08-16 (×9): via INTRAVENOUS

## 2017-08-13 MED ORDER — ONDANSETRON HCL 4 MG/2ML IJ SOLN: 4.0000 mg | Freq: Four times a day (QID) | INTRAMUSCULAR | Status: DC | PRN

## 2017-08-13 MED ORDER — ACETAMINOPHEN 650 MG RE SUPP: 650.0000 mg | Freq: Four times a day (QID) | RECTAL | Status: DC | PRN

## 2017-08-13 MED ORDER — IOPAMIDOL (ISOVUE-300) INJECTION 61%
30.0000 mL | Freq: Once | INTRAVENOUS | Status: AC
  Administered 2017-08-13: 30 mL via ORAL

## 2017-08-13 MED ORDER — HYDROMORPHONE HCL 1 MG/ML IJ SOLN
1.0000 mg | Freq: Once | INTRAMUSCULAR | Status: AC
  Administered 2017-08-13: 1 mg via INTRAVENOUS
  Filled 2017-08-13: qty 1

## 2017-08-13 MED ORDER — SODIUM CHLORIDE 0.9 % IV BOLUS
1000.0000 mL | Freq: Once | INTRAVENOUS | Status: AC
  Administered 2017-08-13: 1000 mL via INTRAVENOUS

## 2017-08-13 MED ORDER — BISACODYL 5 MG PO TBEC: 5.0000 mg | DELAYED_RELEASE_TABLET | Freq: Every day | ORAL | Status: DC | PRN

## 2017-08-13 MED ORDER — OLANZAPINE 10 MG PO TABS
10.0000 mg | ORAL_TABLET | Freq: Every day | ORAL | Status: DC
  Filled 2017-08-13: qty 1

## 2017-08-13 MED ORDER — HYDROCODONE-ACETAMINOPHEN 5-325 MG PO TABS: 1.0000 | ORAL_TABLET | Freq: Four times a day (QID) | ORAL | Status: DC | PRN

## 2017-08-13 MED ORDER — DOCUSATE SODIUM 100 MG PO CAPS
100.0000 mg | ORAL_CAPSULE | Freq: Two times a day (BID) | ORAL | Status: DC
  Administered 2017-08-13 – 2017-08-17 (×9): 100 mg via ORAL
  Filled 2017-08-13 (×11): qty 1

## 2017-08-13 MED ORDER — IOHEXOL 300 MG/ML  SOLN
100.0000 mL | Freq: Once | INTRAMUSCULAR | Status: AC | PRN
  Administered 2017-08-13: 100 mL via INTRAVENOUS

## 2017-08-13 MED ORDER — OLANZAPINE 10 MG PO TABS
10.0000 mg | ORAL_TABLET | Freq: Every day | ORAL | Status: DC
  Administered 2017-08-13 – 2017-08-16 (×4): 10 mg via ORAL
  Filled 2017-08-13 (×5): qty 1

## 2017-08-13 MED ORDER — ONDANSETRON HCL 4 MG PO TABS: 4.0000 mg | ORAL_TABLET | Freq: Four times a day (QID) | ORAL | Status: DC | PRN

## 2017-08-13 MED ORDER — ACETAMINOPHEN 325 MG PO TABS: 650.0000 mg | ORAL_TABLET | Freq: Four times a day (QID) | ORAL | Status: DC | PRN

## 2017-08-13 NOTE — ED Provider Notes (Signed)
[facility] Emergency Department Provider Note   ____________________________________________   First MD Initiated Contact with Patient 08/13/17 [phone]     (approximate)  I have reviewed the triage vital signs and the nursing notes.   HISTORY  Chief Complaint Abdominal Pain    HPI Travis Hawkins is a 42 y.o. male who presents to the ED from home via EMS with a chief complaint of epigastric abdominal pain and nausea.  Patient has a complicated medical history including recurrent biliary obstruction status post ERCP and biliary stenting.  Most recently had laparoscopic cholecystectomy on 4/1 at [facility].  He was readmitted several days later for intractable abdominal pain.  Tells me he received another biliary stent during his stay at [facility].  Had labs done by his PCP last week which demonstrated elevated lipase.  He was instructed to increase his hydration and to be rechecked today.  States he was hurting so bad he could not make it to his doctor's appointment.  Tells me oral Dilaudid tablets and tramadol do not help his pain.  Over the years he has tried Vicodin and Percocet and nothing really gives him significant relief.  Denies fever, chills, chest pain, shortness of breath, vomiting, diarrhea.  Denies recent travel or trauma.   Past Medical History:  Diagnosis Date  . Biliary obstruction   . Depression   . Mood disorder [facility])     Patient Active Problem List   Diagnosis Date Noted  . Obstruction of bile duct   . Encounter for fit/adjst of GI appliance and device   . Presence of other specified functional implants   . Cholestasis 04/03/2017  . Elevated LFTs 04/03/2017  . Jaundice, hepatocellular   . Encounter for fitting and adjustment of other gastrointestinal appliance and device   . Acute acalculous cholecystitis 03/13/2017  . Acute pancreatitis   . Transaminitis   . Calculus of bile duct without cholecystitis and without obstruction   . Other specified  diseases of biliary tract   . Elevated liver enzymes   . Biliary obstruction 03/09/2017    Past Surgical History:  Procedure Laterality Date  . CHOLECYSTECTOMY    . ENDOSCOPIC RETROGRADE CHOLANGIOPANCREATOGRAPHY (ERCP) WITH PROPOFOL N/A 03/11/2017   Procedure: ENDOSCOPIC RETROGRADE CHOLANGIOPANCREATOGRAPHY (ERCP) WITH PROPOFOL;  Surgeon: Lucilla Lame, MD;  Location: [facility] ENDOSCOPY;  Service: Endoscopy;  Laterality: N/A;  . ERCP N/A 03/19/2017   Procedure: ENDOSCOPIC RETROGRADE CHOLANGIOPANCREATOGRAPHY (ERCP);  Surgeon: Lucilla Lame, MD;  Location: [facility] ENDOSCOPY;  Service: Endoscopy;  Laterality: N/A;  . ERCP N/A 06/25/2017   Procedure: ENDOSCOPIC RETROGRADE CHOLANGIOPANCREATOGRAPHY (ERCP);  Surgeon: Lucilla Lame, MD;  Location: [facility] ENDOSCOPY;  Service: Endoscopy;  Laterality: N/A;  . ERCP N/A 07/16/2017   Procedure: ENDOSCOPIC RETROGRADE CHOLANGIOPANCREATOGRAPHY (ERCP);  Surgeon: Lucilla Lame, MD;  Location: [facility] ENDOSCOPY;  Service: Endoscopy;  Laterality: N/A;  . none      Prior to Admission medications   Medication Sig Start Date End Date Taking? Authorizing Provider  HYDROcodone-acetaminophen (NORCO/VICODIN) 5-325 MG tablet Take 1 tablet by mouth every 6 (six) hours as needed for moderate pain. 07/18/17   Arta Silence, MD  lipase/protease/amylase (CREON) 36000 UNITS CPEP capsule Take 2 capsules with each meal and 1 with each snack Patient not taking: Reported on 07/18/2017 05/16/17   Lin Landsman, MD  OLANZapine (ZYPREXA) 10 MG tablet Take 10 mg by mouth. 04/11/17   [provider]    Allergies Lamotrigine  Family History  Problem Relation Age of Onset  . Cancer  Maternal Aunt   . Breast cancer Maternal Grandmother   . Lung cancer Maternal Grandfather   . Diabetes Mellitus II Mother     Social History Social History   Tobacco Use  . Smoking status: Former Smoker    Packs/day: 1.00    Years: 18.00    Pack years: 18.00    Types: E-cigarettes  .  Smokeless tobacco: Never Used  Substance Use Topics  . Alcohol use: No    Frequency: Never    Comment: Occasional wine about every 3-4 months  . Drug use: No    Review of Systems  Constitutional: No fever/chills. Eyes: No visual changes. ENT: No sore throat. Cardiovascular: Denies chest pain. Respiratory: Denies shortness of breath. Gastrointestinal: Positive for epigastric abdominal pain and nausea, no vomiting.  No diarrhea.  No constipation. Genitourinary: Negative for dysuria. Musculoskeletal: Negative for back pain. Skin: Negative for rash. Neurological: Negative for headaches, focal weakness or numbness.   ____________________________________________   PHYSICAL EXAM:  VITAL SIGNS: ED Triage Vitals [08/12/17 2118]  Enc Vitals Group     BP 120/76     Pulse Rate 82     Resp 18     Temp 98.4 F (36.9 C)     Temp Source Oral     SpO2 99 %     Weight 200 lb (90.7 kg)     Height 5\' 8"  (1.727 m)     Head Circumference      Peak Flow      Pain Score 10     Pain Loc      Pain Edu?      Excl. in [place]?     Constitutional: Alert and oriented. Well appearing and in no acute distress. Eyes: Conjunctivae are normal. PERRL. EOMI. Head: Atraumatic. Nose: No congestion/rhinnorhea. Mouth/Throat: Mucous membranes are moist.  Oropharynx non-erythematous. Neck: No stridor.   Cardiovascular: Normal rate, regular rhythm. Grossly normal heart sounds.  Good peripheral circulation. Respiratory: Normal respiratory effort.  No retractions. Lungs CTAB. Gastrointestinal: Soft and mildly tender to palpation epigastrium without rebound or guarding. No distention. No abdominal bruits. No CVA tenderness. Musculoskeletal: No lower extremity tenderness nor edema.  No joint effusions. Neurologic:  Normal speech and language. No gross focal neurologic deficits are appreciated. No gait instability. Skin:  Skin is warm, dry and intact. No rash noted. Psychiatric: Mood and affect are normal.  Speech and behavior are normal.  ____________________________________________   LABS (all labs ordered are listed, but only abnormal results are displayed)  Labs Reviewed  LIPASE, BLOOD - Abnormal; Notable for the following components:      Result Value   Lipase 302 (*)    All other components within normal limits  COMPREHENSIVE METABOLIC PANEL - Abnormal; Notable for the following components:   Glucose, Bld 190 (*)    Total Protein 8.3 (*)    Alkaline Phosphatase 153 (*)    All other components within normal limits  CBC - Abnormal; Notable for the following components:   WBC 13.9 (*)    RBC 3.95 (*)    Hemoglobin 12.4 (*)    HCT 36.0 (*)    All other components within normal limits  URINALYSIS, COMPLETE (UACMP) WITH MICROSCOPIC - Abnormal; Notable for the following components:   Color, Urine YELLOW (*)    APPearance CLEAR (*)    Bacteria, UA RARE (*)    All other components within normal limits  TRIGLYCERIDES   ____________________________________________  EKG  None ____________________________________________  RADIOLOGY  ED  MD interpretation: Findings consistent with acute pancreatitis  Official radiology report(s): Dg Chest 2 View  Result Date: 08/13/2017 CLINICAL DATA:  Generalized abdominal pain. Status post recent cholecystectomy with stent placement. EXAM: CHEST - 2 VIEW COMPARISON:  Chest radiograph July 18, 2017 FINDINGS: Cardiomediastinal silhouette is normal. No pleural effusions or focal consolidations. Trachea projects midline and there is no pneumothorax. Soft tissue planes and included osseous structures are non-suspicious. Surgical clips in the included right abdomen compatible with cholecystectomy. IMPRESSION: Negative. Electronically Signed   By: Elon Alas M.D.   On: 08/13/2017 04:28   Ct Abdomen Pelvis W Contrast  Result Date: 08/13/2017 CLINICAL DATA:  Acute onset of generalized abdominal pain. Recent cholecystectomy. EXAM: CT ABDOMEN AND  PELVIS WITH CONTRAST TECHNIQUE: Multidetector CT imaging of the abdomen and pelvis was performed using the standard protocol following bolus administration of intravenous contrast. CONTRAST:  1103mL OMNIPAQUE IOHEXOL 300 MG/ML  SOLN COMPARISON:  CT of the abdomen and pelvis from 05/13/2017 FINDINGS: Lower chest: The visualized lung bases are grossly clear. The visualized portions of the mediastinum are unremarkable. Hepatobiliary: Pneumobilia is noted. The liver is otherwise unremarkable. The patient is status post cholecystectomy. Trace fluid is noted at the gallbladder fossa. There is no evidence of bile leak. A common bile duct stent is again noted. Pancreas: There is new diffuse edema tracking about the entirety of the pancreas, most prominent at the head of the pancreas, compatible with acute pancreatitis. Mild devascularization is noted at the head of the pancreas. There is no evidence of pseudocyst formation at this time. Spleen: The spleen is unremarkable in appearance. Adrenals/Urinary Tract: The adrenal glands are unremarkable in appearance. The kidneys are within normal limits. There is no evidence of hydronephrosis. No renal or ureteral stones are identified. No perinephric stranding is seen. Stomach/Bowel: The stomach is unremarkable in appearance. The small bowel is within normal limits. The appendix is normal in caliber, without evidence of appendicitis. The colon is unremarkable in appearance. Vascular/Lymphatic: The abdominal aorta is unremarkable in appearance. The inferior vena cava is grossly unremarkable. No retroperitoneal lymphadenopathy is seen. No pelvic sidewall lymphadenopathy is identified. Reproductive: The bladder is mildly distended and grossly unremarkable. The prostate is normal in size. Other: No additional soft tissue abnormalities are seen. Musculoskeletal: No acute osseous abnormalities are identified. The visualized musculature is unremarkable in appearance. IMPRESSION: 1. Acute  pancreatitis involving the entirety of the pancreas. Mild devascularization at the head of the pancreas, without evidence for pseudocyst formation at this time. 2. Pneumobilia noted. No evidence of bile leak. Trace postoperative fluid at the gallbladder fossa, status post recent cholecystectomy. Common bile duct stent again noted. Electronically Signed   By: Garald Balding M.D.   On: 08/13/2017 06:01    ____________________________________________   PROCEDURES  Procedure(s) performed: None  Procedures  Critical Care performed: No  ____________________________________________   INITIAL IMPRESSION / ASSESSMENT AND PLAN / ED COURSE  As part of my medical decision making, I reviewed the following data within the [place] notes reviewed and incorporated, Labs reviewed, Old chart reviewed, Radiograph reviewed  and Notes from prior ED visits   42 year old male with recurrent pancreatitis, biliary pressure extraction and recent laparoscopic cholecystectomy who presents with epigastric abdominal pain and nausea. Differential diagnosis includes, but is not limited to, biliary disease, dislodged or nonworking biliary stent, intrathoracic causes for epigastric abdominal pain including ACS, gastritis, duodenitis, pancreatitis, small bowel or large bowel obstruction, abdominal aortic aneurysm, hernia, and gastritis.  Lipase is  trending down from prior labs obtained in the ED.  Mild leukocytosis noted.  Will add sedative in addition to analgesia to help with symptomatic relief of pain.  Will initiate IV fluid resuscitation and proceed with CT abdomen/pelvis to evaluate for postoperative complications and assess biliary stent placement.   Clinical Course as of Aug 14 611  Tue Aug 13, 2017  0608 Still complains of intractable pain.  Updated patient of CT imaging results.  Will discuss with hospitalist to evaluate patient in the emergency department for admission.   [JS]      Clinical Course User Index [JS] Paulette Blanch, MD     ____________________________________________   FINAL CLINICAL IMPRESSION(S) / ED DIAGNOSES  Final diagnoses:  Epigastric pain  Acute pancreatitis, unspecified complication status, unspecified pancreatitis type  Intractable pain     ED Discharge Orders    None       Note:  This document was prepared using Dragon voice recognition software and may include unintentional dictation errors.    Paulette Blanch, MD 08/13/17 [phone]

## 2017-08-13 NOTE — ED Notes (Signed)
CT called. Pt finished the contrast.

## 2017-08-13 NOTE — ED Notes (Signed)
Received report from [facility], care assumed. Pt awaiting admission.

## 2017-08-13 NOTE — H&P (Addendum)
[place] [facility] [place] NAME: Travis Hawkins    MR#:  [id number]  DATE OF BIRTH:  [birth year]  DATE OF ADMISSION:  08/13/2017  PRIMARY CARE PHYSICIAN: Donnamarie Rossetti, PA-C   REQUESTING/REFERRING PHYSICIAN: Paulette Blanch, MD  CHIEF COMPLAINT:   Chief Complaint  Patient presents with  . Abdominal Pain    HISTORY OF PRESENT ILLNESS:  Travis Hawkins  is a 42 y.o. male with a known history of Depression being admitted for acute pancreatitis. He hwas having epigastric abdominal pain and nausea.  Patient has a complicated medical history including recurrent biliary obstruction status post ERCP and biliary stenting.  Most recently had laparoscopic cholecystectomy on 4/1 at [facility]. He was readmitted (4/6-4/10) several days later for intractable abdominal pain. Had labs done by his PCP last week on Friday which demonstrated elevated lipase. He was hurting so bad he could not make it to his doctor's appointment and got to ED as pain was uncontrolled. PAST MEDICAL HISTORY:   Past Medical History:  Diagnosis Date  . Biliary obstruction   . Depression   . Mood disorder ([place])    PAST SURGICAL HISTORY:   Past Surgical History:  Procedure Laterality Date  . CHOLECYSTECTOMY    . ENDOSCOPIC RETROGRADE CHOLANGIOPANCREATOGRAPHY (ERCP) WITH PROPOFOL N/A 03/11/2017   Procedure: ENDOSCOPIC RETROGRADE CHOLANGIOPANCREATOGRAPHY (ERCP) WITH PROPOFOL;  Surgeon: Lucilla Lame, MD;  Location: [facility] ENDOSCOPY;  Service: Endoscopy;  Laterality: N/A;  . ERCP N/A 03/19/2017   Procedure: ENDOSCOPIC RETROGRADE CHOLANGIOPANCREATOGRAPHY (ERCP);  Surgeon: Lucilla Lame, MD;  Location: [facility] ENDOSCOPY;  Service: Endoscopy;  Laterality: N/A;  . ERCP N/A 06/25/2017   Procedure: ENDOSCOPIC RETROGRADE CHOLANGIOPANCREATOGRAPHY (ERCP);  Surgeon: Lucilla Lame, MD;  Location: [facility] ENDOSCOPY;  Service: Endoscopy;  Laterality: N/A;  . ERCP N/A 07/16/2017   Procedure: ENDOSCOPIC RETROGRADE  CHOLANGIOPANCREATOGRAPHY (ERCP);  Surgeon: Lucilla Lame, MD;  Location: [facility] ENDOSCOPY;  Service: Endoscopy;  Laterality: N/A;  . none      SOCIAL HISTORY:   Social History   Tobacco Use  . Smoking status: Former Smoker    Packs/day: 1.00    Years: 18.00    Pack years: 18.00    Types: E-cigarettes  . Smokeless tobacco: Never Used  Substance Use Topics  . Alcohol use: No    Frequency: Never    Comment: Occasional wine about every 3-4 months    FAMILY HISTORY:   Family History  Problem Relation Age of Onset  . Cancer Maternal Aunt   . Breast cancer Maternal Grandmother   . Lung cancer Maternal Grandfather   . Diabetes Mellitus II Mother     DRUG ALLERGIES:   Allergies  Allergen Reactions  . Lamotrigine Other (See Comments)    DILI 2018    REVIEW OF SYSTEMS:   Review of Systems  Constitutional: Negative for chills, fever and weight loss.  HENT: Negative for nosebleeds and sore throat.   Eyes: Negative for blurred vision.  Respiratory: Negative for cough, shortness of breath and wheezing.   Cardiovascular: Negative for chest pain, orthopnea, leg swelling and PND.  Gastrointestinal: Positive for abdominal pain and nausea. Negative for constipation, diarrhea, heartburn and vomiting.  Genitourinary: Negative for dysuria and urgency.  Musculoskeletal: Negative for back pain.  Skin: Negative for rash.  Neurological: Negative for dizziness, speech change, focal weakness and headaches.  Endo/Heme/Allergies: Does not bruise/bleed easily.  Psychiatric/Behavioral: Negative for depression.   MEDICATIONS AT HOME:   Prior to Admission medications   Medication Sig  Start Date End Date Taking? Authorizing Provider  OLANZapine (ZYPREXA) 10 MG tablet Take 10 mg by mouth at bedtime.  04/11/17  Yes [provider]  HYDROcodone-acetaminophen (NORCO/VICODIN) 5-325 MG tablet Take 1 tablet by mouth every 6 (six) hours as needed for moderate pain. 07/18/17   Arta Silence, MD  lipase/protease/amylase (CREON) 36000 UNITS CPEP capsule Take 2 capsules with each meal and 1 with each snack Patient not taking: Reported on 07/18/2017 05/16/17   Lin Landsman, MD  traMADol (ULTRAM) 50 MG tablet Take 1 tablet by mouth every 8 (eight) hours as needed. 08/10/17   [provider]      VITAL SIGNS:  Blood pressure 124/89, pulse 92, temperature 98.2 F (36.8 C), temperature source Oral, resp. rate 18, height 5\' 8"  (1.727 m), weight 90.7 kg (200 lb), SpO2 100 %.  PHYSICAL EXAMINATION:  Physical Exam  GENERAL:  42 y.o.-year-old patient lying in the bed with no acute distress.  EYES: Pupils equal, round, reactive to light and accommodation. No scleral icterus. Extraocular muscles intact.  HEENT: Head atraumatic, normocephalic. Oropharynx and nasopharynx clear.  NECK:  Supple, no jugular venous distention. No thyroid enlargement, no tenderness.  LUNGS: Normal breath sounds bilaterally, no wheezing, rales,rhonchi or crepitation. No use of accessory muscles of respiration.  CARDIOVASCULAR: S1, S2 normal. No murmurs, rubs, or gallops.  ABDOMEN: Soft, nontender, nondistended. Bowel sounds present. No organomegaly or mass.  EXTREMITIES: No pedal edema, cyanosis, or clubbing.  NEUROLOGIC: Cranial nerves II through XII are intact. Muscle strength 5/5 in all extremities. Sensation intact. Gait not checked.  PSYCHIATRIC: The patient is alert and oriented x 3.  SKIN: No obvious rash, lesion, or ulcer.   LABORATORY PANEL:   CBC Recent Labs  Lab 08/12/17 2120  WBC 13.9*  HGB 12.4*  HCT 36.0*  PLT 380   ------------------------------------------------------------------------------------------------------------------  Chemistries  Recent Labs  Lab 08/12/17 2120  NA 136  K 3.9  CL 102  CO2 25  GLUCOSE 190*  BUN 6  CREATININE 0.71  CALCIUM 9.1  AST 28  ALT 26  ALKPHOS 153*  BILITOT 0.3    ------------------------------------------------------------------------------------------------------------------  Cardiac Enzymes No results for input(s): TROPONINI in the last 168 hours. ------------------------------------------------------------------------------------------------------------------  RADIOLOGY:  Dg Chest 2 View  Result Date: 08/13/2017 CLINICAL DATA:  Generalized abdominal pain. Status post recent cholecystectomy with stent placement. EXAM: CHEST - 2 VIEW COMPARISON:  Chest radiograph July 18, 2017 FINDINGS: Cardiomediastinal silhouette is normal. No pleural effusions or focal consolidations. Trachea projects midline and there is no pneumothorax. Soft tissue planes and included osseous structures are non-suspicious. Surgical clips in the included right abdomen compatible with cholecystectomy. IMPRESSION: Negative. Electronically Signed   By: Elon Alas M.D.   On: 08/13/2017 04:28   Ct Abdomen Pelvis W Contrast  Result Date: 08/13/2017 CLINICAL DATA:  Acute onset of generalized abdominal pain. Recent cholecystectomy. EXAM: CT ABDOMEN AND PELVIS WITH CONTRAST TECHNIQUE: Multidetector CT imaging of the abdomen and pelvis was performed using the standard protocol following bolus administration of intravenous contrast. CONTRAST:  127mL OMNIPAQUE IOHEXOL 300 MG/ML  SOLN COMPARISON:  CT of the abdomen and pelvis from 05/13/2017 FINDINGS: Lower chest: The visualized lung bases are grossly clear. The visualized portions of the mediastinum are unremarkable. Hepatobiliary: Pneumobilia is noted. The liver is otherwise unremarkable. The patient is status post cholecystectomy. Trace fluid is noted at the gallbladder fossa. There is no evidence of bile leak. A common bile duct stent is again noted. Pancreas: There is new diffuse  edema tracking about the entirety of the pancreas, most prominent at the head of the pancreas, compatible with acute pancreatitis. Mild devascularization is  noted at the head of the pancreas. There is no evidence of pseudocyst formation at this time. Spleen: The spleen is unremarkable in appearance. Adrenals/Urinary Tract: The adrenal glands are unremarkable in appearance. The kidneys are within normal limits. There is no evidence of hydronephrosis. No renal or ureteral stones are identified. No perinephric stranding is seen. Stomach/Bowel: The stomach is unremarkable in appearance. The small bowel is within normal limits. The appendix is normal in caliber, without evidence of appendicitis. The colon is unremarkable in appearance. Vascular/Lymphatic: The abdominal aorta is unremarkable in appearance. The inferior vena cava is grossly unremarkable. No retroperitoneal lymphadenopathy is seen. No pelvic sidewall lymphadenopathy is identified. Reproductive: The bladder is mildly distended and grossly unremarkable. The prostate is normal in size. Other: No additional soft tissue abnormalities are seen. Musculoskeletal: No acute osseous abnormalities are identified. The visualized musculature is unremarkable in appearance. IMPRESSION: 1. Acute pancreatitis involving the entirety of the pancreas. Mild devascularization at the head of the pancreas, without evidence for pseudocyst formation at this time. 2. Pneumobilia noted. No evidence of bile leak. Trace postoperative fluid at the gallbladder fossa, status post recent cholecystectomy. Common bile duct stent again noted. Electronically Signed   By: Garald Balding M.D.   On: 08/13/2017 06:01   IMPRESSION AND PLAN:  42 y.o. 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 Patient recently admitted 07/20/17 and  discharged home 08/02/2017. Notably he has had multiple prior admissions outside of the [place] system at [facility] or through [facility] between 02/2017 and now. Being admitted for now Acute on chronic pancreatitis    * Acute on chronic recurrent Pancreatitis: pain 10/10 #h/o recurrent biliary stricture s/p stent #h/o cholecystectomy (07/29/17) - c/s GI (has been seen by Dr Purnell Shoemaker in past) - agreesive IVF - pain meds prn - Pancreatic enzymes - NPO except meds and sips of water - advance as tolerated  * Depression/Bipolar disorder - continue zyprexa  * Abd pain - continue pain meds and mgmt of pancreatitis  * Leukocytosis - likely due to pain/stress - monitor      All the records are reviewed and case discussed with ED provider. Management plans discussed with the patient, nursing and they are in agreement.  CODE STATUS: FULL CODE  TOTAL TIME TAKING CARE OF THIS PATIENT: 45 minutes.    Max Sane M.D on 08/13/2017 at 1:37 PM  Between 7am to 6pm - Pager - [phone]  After 6pm go to [email or web address] - [employer] [facility]  Office  [phone]  CC: Primary care physician; Whitaker, Rolanda Jay, PA-C   Note: This dictation was prepared with Dragon dictation along with smaller phrase technology. Any transcriptional errors that result from this process are unintentional.

## 2017-08-13 NOTE — ED Notes (Signed)
Pt requesting fluids po, I explained diet order of NPO except sips with meds.  Pt states he would like the dr called.  Spoke with dr Manuella Ghazi regarding diet, verified NPO except sips with meds.  Advised pt of same.

## 2017-08-13 NOTE — ED Notes (Signed)
Called lab and added triglycerides to blood already in lab.

## 2017-08-13 NOTE — ED Notes (Signed)
Pt went to X-ray.   

## 2017-08-13 NOTE — ED Notes (Addendum)
Pt continues to rest. 

## 2017-08-14 DIAGNOSIS — K851 Biliary acute pancreatitis without necrosis or infection: Secondary | ICD-10-CM

## 2017-08-14 LAB — COMPREHENSIVE METABOLIC PANEL
ALK PHOS: 114 U/L (ref 38–126)
ALT: 22 U/L (ref 17–63)
AST: 17 U/L (ref 15–41)
Albumin: 3.3 g/dL — ABNORMAL LOW (ref 3.5–5.0)
Anion gap: 6 (ref 5–15)
BUN: 7 mg/dL (ref 6–20)
CALCIUM: 8.6 mg/dL — AB (ref 8.9–10.3)
CHLORIDE: 106 mmol/L (ref 101–111)
CO2: 26 mmol/L (ref 22–32)
CREATININE: 0.69 mg/dL (ref 0.61–1.24)
Glucose, Bld: 98 mg/dL (ref 65–99)
Potassium: 4.1 mmol/L (ref 3.5–5.1)
Sodium: 138 mmol/L (ref 135–145)
TOTAL PROTEIN: 6.8 g/dL (ref 6.5–8.1)
Total Bilirubin: 0.7 mg/dL (ref 0.3–1.2)

## 2017-08-14 LAB — CBC
HEMATOCRIT: 34.5 % — AB (ref 40.0–52.0)
Hemoglobin: 11.7 g/dL — ABNORMAL LOW (ref 13.0–18.0)
MCH: 31.3 pg (ref 26.0–34.0)
MCHC: 34.1 g/dL (ref 32.0–36.0)
MCV: 91.7 fL (ref 80.0–100.0)
Platelets: 324 10*3/uL (ref 150–440)
RBC: 3.76 MIL/uL — ABNORMAL LOW (ref 4.40–5.90)
RDW: 13.1 % (ref 11.5–14.5)
WBC: 8.6 10*3/uL (ref 3.8–10.6)

## 2017-08-14 LAB — GLUCOSE, CAPILLARY: GLUCOSE-CAPILLARY: 102 mg/dL — AB (ref 65–99)

## 2017-08-14 LAB — LIPASE, BLOOD: LIPASE: 159 U/L — AB (ref 11–51)

## 2017-08-14 LAB — AMYLASE: Amylase: 145 U/L — ABNORMAL HIGH (ref 28–100)

## 2017-08-14 MED ORDER — HYDROMORPHONE HCL 2 MG PO TABS
1.0000 mg | ORAL_TABLET | ORAL | Status: DC | PRN
  Administered 2017-08-15 (×2): 1 mg via ORAL
  Filled 2017-08-14 (×2): qty 1

## 2017-08-14 MED ORDER — HYDROMORPHONE HCL 1 MG/ML IJ SOLN
0.5000 mg | Freq: Once | INTRAMUSCULAR | Status: AC
  Administered 2017-08-14: 21:00:00 0.5 mg via INTRAVENOUS
  Filled 2017-08-14: qty 1

## 2017-08-14 MED ORDER — HYDROCOD POLST-CPM POLST ER 10-8 MG/5ML PO SUER: 5.0000 mL | Freq: Two times a day (BID) | ORAL | Status: DC

## 2017-08-14 MED ORDER — PREMIER PROTEIN SHAKE
11.0000 [oz_av] | Freq: Two times a day (BID) | ORAL | Status: DC
  Administered 2017-08-14 – 2017-08-16 (×3): 11 [oz_av] via ORAL

## 2017-08-14 MED ORDER — MORPHINE SULFATE (PF) 2 MG/ML IV SOLN: 2.0000 mg | Freq: Every day | INTRAVENOUS | Status: DC | PRN

## 2017-08-14 MED ORDER — NICOTINE 14 MG/24HR TD PT24
14.0000 mg | MEDICATED_PATCH | Freq: Every day | TRANSDERMAL | Status: DC
  Administered 2017-08-14 – 2017-08-16 (×3): 14 mg via TRANSDERMAL
  Filled 2017-08-14 (×4): qty 1

## 2017-08-14 MED ORDER — HYDROCODONE-ACETAMINOPHEN 5-325 MG PO TABS
1.0000 | ORAL_TABLET | Freq: Four times a day (QID) | ORAL | Status: DC | PRN
  Administered 2017-08-14 (×3): 1 via ORAL
  Filled 2017-08-14 (×3): qty 1

## 2017-08-14 NOTE — Progress Notes (Addendum)
[place] [facility] [place] NAME: Travis Hawkins    MR#:  [id number]  DATE OF BIRTH:  May 07, 1975  SUBJECTIVE:  CHIEF COMPLAINT:   Chief Complaint  Patient presents with  . Abdominal Pain  hungry, requesting diet, didn't use much morphine REVIEW OF SYSTEMS:  Review of Systems  Constitutional: Negative for chills, fever and weight loss.  HENT: Negative for nosebleeds and sore throat.   Eyes: Negative for blurred vision.  Respiratory: Negative for cough, shortness of breath and wheezing.   Cardiovascular: Negative for chest pain, orthopnea, leg swelling and PND.  Gastrointestinal: Positive for abdominal pain. Negative for constipation, diarrhea, heartburn, nausea and vomiting.  Genitourinary: Negative for dysuria and urgency.  Musculoskeletal: Negative for back pain.  Skin: Negative for rash.  Neurological: Negative for dizziness, speech change, focal weakness and headaches.  Endo/Heme/Allergies: Does not bruise/bleed easily.  Psychiatric/Behavioral: Negative for depression.    DRUG ALLERGIES:   Allergies  Allergen Reactions  . Lamotrigine Other (See Comments)    DILI 2018   VITALS:  Blood pressure 119/84, pulse 66, temperature 98.2 F (36.8 C), temperature source Oral, resp. rate 18, height 5\' 8"  (1.727 m), weight 90.7 kg (200 lb), SpO2 100 %. PHYSICAL EXAMINATION:  Physical Exam  Constitutional: He is oriented to person, place, and time.  HENT:  Head: Normocephalic and atraumatic.  Eyes: Pupils are equal, round, and reactive to light. Conjunctivae and EOM are normal.  Neck: Normal range of motion. Neck supple. No tracheal deviation present. No thyromegaly present.  Cardiovascular: Normal rate, regular rhythm and normal heart sounds.  Pulmonary/Chest: Effort normal and breath sounds normal. No respiratory distress. He has no wheezes. He exhibits no tenderness.  Abdominal: Soft. Bowel sounds are normal. He exhibits no distension. There  is no tenderness.  Musculoskeletal: Normal range of motion.  Neurological: He is alert and oriented to person, place, and time. No cranial nerve deficit.  Skin: Skin is warm and dry. No rash noted.   LABORATORY PANEL:  Male CBC Recent Labs  Lab 08/14/17 0604  WBC 8.6  HGB 11.7*  HCT 34.5*  PLT 324   ------------------------------------------------------------------------------------------------------------------ Chemistries  Recent Labs  Lab 08/14/17 0604  NA 138  K 4.1  CL 106  CO2 26  GLUCOSE 98  BUN 7  CREATININE 0.69  CALCIUM 8.6*  AST 17  ALT 22  ALKPHOS 114  BILITOT 0.7   RADIOLOGY:  No results found. ASSESSMENT AND PLAN:   42 y.o. 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 Patient recently admitted 07/20/17 and discharged home 08/02/2017. Notably he has had multiple prior admissions outside of the [place] system at [facility] or through [facility] between 02/2017 and now. Being admitted for now Acute on chronic pancreatitis   * Acute on chronic recurrent Pancreatitis: pain 10/10 #h/o recurrent biliary stricture s/p stent #h/o cholecystectomy (07/29/17) - slowly improving, lipase 302->159 - pain meds prn, stop morphine and try po norco - Pancreatic enzymes - start CLD - advance as tolerated  * Depression/Bipolar disorder - continue zyprexa  * Abd pain - continue pain meds and mgmt of pancreatitis  * Leukocytosis - likely due to pain/stress - monitor       All the records are reviewed  and case discussed with Care Management/Social Worker. Management plans discussed with the patient, family and they are in agreement.  CODE STATUS: Full  Code  TOTAL TIME TAKING CARE OF THIS PATIENT: 35 minutes.   More than 50% of the time was spent in counseling/coordination of care: YES  POSSIBLE D/C IN 1 DAYS, DEPENDING ON CLINICAL CONDITION.   Max Sane M.D on 08/14/2017 at 11:13 PM  Between 7am to 6pm - Pager - [phone]  After 6pm go to [email or web address] - [employer] [facility]  Office  [phone]  CC: Primary care physician; Whitaker, Rolanda Jay, PA-C  Note: This dictation was prepared with Dragon dictation along with smaller phrase technology. Any transcriptional errors that result from this process are unintentional.

## 2017-08-14 NOTE — Consult Note (Signed)
Travis Antigua, MD [address], [place], [place], [place], [address] [address], [place], [place], [place], [address] Phone: [phone]  Fax: [phone]  Consultation  Referring Provider:     Dr. Manuella Ghazi Primary Care Physician:  Donnamarie Rossetti, PA-C Reason for Consultation:     Abdominal pain Primary gastroenterologist: Dr. Marius Ditch  Date of Admission:  08/13/2017 Date of Consultation:  08/14/2017         HPI:   Travis Hawkins is a 42 y.o. male with complicated past medical history presents with sharp abdominal pain.  10/10, bilateral upper quadrant and midepigastric, ongoing for 2-3 weeks.  Associated with nausea but no vomiting.  No radiation.  No altered bowel habits, hematochezia or melena. He is sitting comfortably in bed during my exam, and has no pain at this time. He states the pain was present at the time of his last hospital admission at [facility] on March 23rd 2019, and has not resolved since then. He states this pain is not new and has continues since that hospital admission and discharge.   He has history of cholestatic jaundice, attributed to Lamictal use, diagnosed eventually via liver biopsy.  Transaminases improved after stopping the medication.  Please see Dr. Verlin Grills previous extensive notes in regard to his extensive history.  He has had multiple ERCPs, with biliary sphincterotomy, and stone extraction from the bile duct, stent placement and replacement for biliary duct strictures, with post ERCP pancreatitis.  Most recent ERCP was at [facility] on March 27th nd a biliary stent was placed.  He also had laparoscopic cholecystectomy on April 1.  As per the discharge summary:  "S/p ERCP 3/27 with main bile duct stent placed for stricture, suspected to be related to ongoing gallbladder disease/inflammation. Cytology showed rare atypia in background of reactive changes/inflammation. Surgery consulted following ERCP and surgical oncology became involved after cytology  results returned. Post-ERCP patient developed recurrent abdominal pain, felt to possibly be another episode of pancreatitis however noted to have new leukocytosis so CT a/p obtained as well to evaluate for intraabdominal process. Constipation also considered to be a potential contributor. CT showed new small hepatic lesions, possibly microabscesses, acute pancreatitis with similar gastropancreatic fluid collection. BCx drawn and started on pipe-tazo empirically. Diet advanced cautiously in setting of pancreatitis as tolerated. Received PRN oxycodone and hydromorphone for pain. Underwent cholecystectomy on 07/29/17. Following this, pt continued on 7 days of antibiotic therapy which will end 08/05/17.  Pt continued to have intermittent abdominal pain thought to be related to resolving pancreatitis which did not worsen with PO intake. Pain gradually improved enough to be tolerable with PO medications only and pt discharged on 08/02/17 on scheduled APAP with short course of prn dilaudid and prn ibuprofen."  Past Medical History:  Diagnosis Date  . Biliary obstruction   . Depression   . Mood disorder [facility])     Past Surgical History:  Procedure Laterality Date  . CHOLECYSTECTOMY    . ENDOSCOPIC RETROGRADE CHOLANGIOPANCREATOGRAPHY (ERCP) WITH PROPOFOL N/A 03/11/2017   Procedure: ENDOSCOPIC RETROGRADE CHOLANGIOPANCREATOGRAPHY (ERCP) WITH PROPOFOL;  Surgeon: Lucilla Lame, MD;  Location: [facility] ENDOSCOPY;  Service: Endoscopy;  Laterality: N/A;  . ERCP N/A 03/19/2017   Procedure: ENDOSCOPIC RETROGRADE CHOLANGIOPANCREATOGRAPHY (ERCP);  Surgeon: Lucilla Lame, MD;  Location: [facility] ENDOSCOPY;  Service: Endoscopy;  Laterality: N/A;  . ERCP N/A 06/25/2017   Procedure: ENDOSCOPIC RETROGRADE CHOLANGIOPANCREATOGRAPHY (ERCP);  Surgeon: Lucilla Lame, MD;  Location: [facility] ENDOSCOPY;  Service: Endoscopy;  Laterality: N/A;  . ERCP N/A  07/16/2017   Procedure: ENDOSCOPIC RETROGRADE CHOLANGIOPANCREATOGRAPHY (ERCP);  Surgeon: Lucilla Lame, MD;  Location: [facility] ENDOSCOPY;  Service: Endoscopy;  Laterality: N/A;  . none      Prior to Admission medications   Medication Sig Start Date End Date Taking? Authorizing Provider  OLANZapine (ZYPREXA) 10 MG tablet Take 10 mg by mouth at bedtime.  04/11/17  Yes [provider]  HYDROcodone-acetaminophen (NORCO/VICODIN) 5-325 MG tablet Take 1 tablet by mouth every 6 (six) hours as needed for moderate pain. 07/18/17   Arta Silence, MD  lipase/protease/amylase (CREON) 36000 UNITS CPEP capsule Take 2 capsules with each meal and 1 with each snack Patient not taking: Reported on 07/18/2017 05/16/17   Lin Landsman, MD  traMADol (ULTRAM) 50 MG tablet Take 1 tablet by mouth every 8 (eight) hours as needed. 08/10/17   [provider]    Family History  Problem Relation Age of Onset  . Cancer Maternal Aunt   . Breast cancer Maternal Grandmother   . Lung cancer Maternal Grandfather   . Diabetes Mellitus II Mother      Social History   Tobacco Use  . Smoking status: Former Smoker    Packs/day: 1.00    Years: 18.00    Pack years: 18.00    Types: E-cigarettes  . Smokeless tobacco: Never Used  Substance Use Topics  . Alcohol use: No    Frequency: Never    Comment: Occasional wine about every 3-4 months  . Drug use: No    Allergies as of 08/12/2017 - Review Complete 08/12/2017  Allergen Reaction Noted  . Lamotrigine Other (See Comments) 04/09/2017    Review of Systems:    All systems reviewed and negative except where noted in HPI.   Physical Exam:  Vital signs in last 24 hours: Vitals:   08/13/17 1225 08/13/17 2128 08/14/17 0431 08/14/17 1300  BP: 124/89 120/87 108/73 122/79  Pulse: 92 68 61 69  Resp: 18 17 18 18   Temp: 98.2 F (36.8 C) 98.3 F (36.8 C) 98.2 F (36.8 C) 98.2 F (36.8 C)  TempSrc: Oral Oral Oral Oral  SpO2: 100% 100% 99% 100%  Weight:      Height:       Last BM Date: 08/12/17 General:   Pleasant, cooperative in  NAD Head:  Normocephalic and atraumatic. Eyes:   No icterus.   Conjunctiva pink. PERRLA. Ears:  Normal auditory acuity. Neck:  Supple; no masses or thyroidomegaly Lungs: Respirations even and unlabored. Lungs clear to auscultation bilaterally.   No wheezes, crackles, or rhonchi.  Abdomen:  Soft, nondistended, nontender. Normal bowel sounds. No appreciable masses or hepatomegaly.  No rebound or guarding.  Neurologic:  Alert and oriented x3;  grossly normal neurologically. Skin:  Intact without significant lesions or rashes. Cervical Nodes:  No significant cervical adenopathy. Psych:  Alert and cooperative. Normal affect.  LAB RESULTS: Recent Labs    08/12/17 2120 08/14/17 0604  WBC 13.9* 8.6  HGB 12.4* 11.7*  HCT 36.0* 34.5*  PLT 380 324   BMET Recent Labs    08/12/17 2120 08/14/17 0604  NA 136 138  K 3.9 4.1  CL 102 106  CO2 25 26  GLUCOSE 190* 98  BUN 6 7  CREATININE 0.71 0.69  CALCIUM 9.1 8.6*   LFT Recent Labs    08/14/17 0604  PROT 6.8  ALBUMIN 3.3*  AST 17  ALT 22  ALKPHOS 114  BILITOT 0.7   PT/INR No results for input(s): LABPROT, INR in  the last 72 hours.  STUDIES: Dg Chest 2 View  Result Date: 08/13/2017 CLINICAL DATA:  Generalized abdominal pain. Status post recent cholecystectomy with stent placement. EXAM: CHEST - 2 VIEW COMPARISON:  Chest radiograph July 18, 2017 FINDINGS: Cardiomediastinal silhouette is normal. No pleural effusions or focal consolidations. Trachea projects midline and there is no pneumothorax. Soft tissue planes and included osseous structures are non-suspicious. Surgical clips in the included right abdomen compatible with cholecystectomy. IMPRESSION: Negative. Electronically Signed   By: Elon Alas M.D.   On: 08/13/2017 04:28   Ct Abdomen Pelvis W Contrast  Result Date: 08/13/2017 CLINICAL DATA:  Acute onset of generalized abdominal pain. Recent cholecystectomy. EXAM: CT ABDOMEN AND PELVIS WITH CONTRAST TECHNIQUE:  Multidetector CT imaging of the abdomen and pelvis was performed using the standard protocol following bolus administration of intravenous contrast. CONTRAST:  150mL OMNIPAQUE IOHEXOL 300 MG/ML  SOLN COMPARISON:  CT of the abdomen and pelvis from 05/13/2017 FINDINGS: Lower chest: The visualized lung bases are grossly clear. The visualized portions of the mediastinum are unremarkable. Hepatobiliary: Pneumobilia is noted. The liver is otherwise unremarkable. The patient is status post cholecystectomy. Trace fluid is noted at the gallbladder fossa. There is no evidence of bile leak. A common bile duct stent is again noted. Pancreas: There is new diffuse edema tracking about the entirety of the pancreas, most prominent at the head of the pancreas, compatible with acute pancreatitis. Mild devascularization is noted at the head of the pancreas. There is no evidence of pseudocyst formation at this time. Spleen: The spleen is unremarkable in appearance. Adrenals/Urinary Tract: The adrenal glands are unremarkable in appearance. The kidneys are within normal limits. There is no evidence of hydronephrosis. No renal or ureteral stones are identified. No perinephric stranding is seen. Stomach/Bowel: The stomach is unremarkable in appearance. The small bowel is within normal limits. The appendix is normal in caliber, without evidence of appendicitis. The colon is unremarkable in appearance. Vascular/Lymphatic: The abdominal aorta is unremarkable in appearance. The inferior vena cava is grossly unremarkable. No retroperitoneal lymphadenopathy is seen. No pelvic sidewall lymphadenopathy is identified. Reproductive: The bladder is mildly distended and grossly unremarkable. The prostate is normal in size. Other: No additional soft tissue abnormalities are seen. Musculoskeletal: No acute osseous abnormalities are identified. The visualized musculature is unremarkable in appearance. IMPRESSION: 1. Acute pancreatitis involving the  entirety of the pancreas. Mild devascularization at the head of the pancreas, without evidence for pseudocyst formation at this time. 2. Pneumobilia noted. No evidence of bile leak. Trace postoperative fluid at the gallbladder fossa, status post recent cholecystectomy. Common bile duct stent again noted. Electronically Signed   By: Garald Balding M.D.   On: 08/13/2017 06:01      Impression / Plan:   CHOICE KLEINSASSER is a 42 y.o. y/o male with history of biliary strictures, requiring biliary stent placement replacement multiple times, with latest biliary stent placed on March 27, by [facility], laparoscopic cholecystectomy on April 1, with history of post ERCP pancreatitis, cholestatic jaundice from Lamictal use admitted with abdominal pain  CT on this admission shows acute pancreatitis involving the entirety of the pancreas.  No pseudocyst formation. His pain has been ongoing since his [place] admission, and is likely due to ERCP pancreatitis since that admission and not new pain as reported by the patient. (previous extensive notes and imaging reports and ERCP reports reviewed)  Patient's liver enzymes are completely normal on this admission Lipase is elevated to 159, however, improved from before which  is also consistent with resolving post ERCP pancreatitis  No fever or elevated white count which is reassuring Normal liver enzymes are reassuring  Continue management for pancreatitis Pain control Advance diet as tolerated Creon continue home dose TID and with snacks No indication for ERCP at this time Follow up with [facility] as scheduled. Stent removal planned by them.  Patient also states they have discussed bilary diversion surgery if symptoms reoccur after stent removal.   Thank you for involving me in the care of this patient.      LOS: 1 day   Virgel Manifold, MD  08/14/2017, 3:53 PM

## 2017-08-14 NOTE — Progress Notes (Signed)
Initial Nutrition Assessment  DOCUMENTATION CODES:   Not applicable  INTERVENTION:   Premier Protein BID, each supplement provides 160 kcal and 30 grams of protein.   NUTRITION DIAGNOSIS:   Inadequate oral intake related to acute illness as evidenced by other (comment)(pt on full liquid diet).  GOAL:   Patient will meet greater than or equal to 90% of their needs  MONITOR:   PO intake, Supplement acceptance, Diet advancement, I & O's, Labs, Weight trends  REASON FOR ASSESSMENT:   Malnutrition Screening Tool   ASSESSMENT:   42 y.o. male who presents to the ED from home via EMS with a chief complaint of epigastric abdominal pain and nausea.  Patient has a complicated medical history including recurrent biliary obstruction status post ERCP and biliary stenting.  Most recently had laparoscopic cholecystectomy on 4/1 at [facility].  He was readmitted several days later for intractable abdominal pain.   Met with pt in room today. Pt reports he tolerated his clear liquid diet this morning with 100% consumed and no nausea/vomiting. Noted pt was advanced to full liquid diet today for lunch. RD will order vanilla premier protein BID to help pt meet estimated calorie and protein needs.  Pt reports his last BM on 4/15 pta. Noted pt was given colace this morning for constipation.  Pt reports consuming foods recently such as alfredo sauce, which resulted in abd pain and a hospital visit. Pt reports not understanding what foods he should and should not eat. Dietetic Intern educated pt on adhering to a low-fat diet for the next few months d/t cholecystectomy and dx of acute pancreatitis. Pt verbalized understanding. Pt asked for handouts outlining foods he should and should not eat. RD will provide a handout at follow-up.  RD will continue to monitor.  Medications reviewed and include: docusate sodium, CREON 36,000 units, nicotine patch, olanzapine, NaCl infusion 75 mL/hr,  hydrocodone-acetaminophen  Labs reviewed: K 4.1 wnl, amylase 145(H), lipase 159(H), WBC 8.6 wnl  NUTRITION - FOCUSED PHYSICAL EXAM:    Most Recent Value  Orbital Region  No depletion  Upper Arm Region  No depletion  Thoracic and Lumbar Region  No depletion  Buccal Region  No depletion  Temple Region  No depletion  Clavicle Bone Region  No depletion  Clavicle and Acromion Bone Region  No depletion  Scapular Bone Region  No depletion  Dorsal Hand  No depletion  Patellar Region  No depletion  Anterior Thigh Region  No depletion  Posterior Calf Region  No depletion  Edema (RD Assessment)  None  Hair  Reviewed  Eyes  Reviewed  Mouth  Reviewed  Skin  Reviewed  Nails  Reviewed     Diet Order:  Diet full liquid Room service appropriate? Yes; Fluid consistency: Thin  EDUCATION NEEDS:   Education needs have been addressed  Skin:  Skin Assessment: Reviewed RN Assessment  Last BM:  08/12/17(per pt report)  Height:   Ht Readings from Last 1 Encounters:  08/12/17 _0  (1.727 m)    Weight:   Wt Readings from Last 1 Encounters:  08/12/17 200 lb (90.7 kg)    Ideal Body Weight:  70 kg  BMI:  Body mass index is 30.41 kg/m.  Estimated Nutritional Needs:   Kcal:  2000-2300 kcals/day (MSJ using IBW x 1.3-1.5)  Protein:  91-105 g/day (IBW x 1.3-1.5)  Fluid:  >2 L/day  Alfonse Ras, [place] Dietetic Intern [phone]

## 2017-08-15 LAB — COMPREHENSIVE METABOLIC PANEL
ALBUMIN: 3.6 g/dL (ref 3.5–5.0)
ALT: 21 U/L (ref 17–63)
AST: 20 U/L (ref 15–41)
Alkaline Phosphatase: 126 U/L (ref 38–126)
Anion gap: 8 (ref 5–15)
BUN: 7 mg/dL (ref 6–20)
CHLORIDE: 105 mmol/L (ref 101–111)
CO2: 24 mmol/L (ref 22–32)
CREATININE: 0.76 mg/dL (ref 0.61–1.24)
Calcium: 8.9 mg/dL (ref 8.9–10.3)
GFR calc Af Amer: >60 mL/min (ref >60)
Glucose, Bld: 123 mg/dL — ABNORMAL HIGH (ref 65–99)
POTASSIUM: 4 mmol/L (ref 3.5–5.1)
SODIUM: 137 mmol/L (ref 135–145)
Total Bilirubin: 0.6 mg/dL (ref 0.3–1.2)
Total Protein: 7.6 g/dL (ref 6.5–8.1)

## 2017-08-15 LAB — CBC
HCT: 35.7 % — ABNORMAL LOW (ref 40.0–52.0)
Hemoglobin: 12.7 g/dL — ABNORMAL LOW (ref 13.0–18.0)
MCH: 32.3 pg (ref 26.0–34.0)
MCHC: 35.5 g/dL (ref 32.0–36.0)
MCV: 91.1 fL (ref 80.0–100.0)
Platelets: 300 10*3/uL (ref 150–440)
RBC: 3.92 MIL/uL — AB (ref 4.40–5.90)
RDW: 13 % (ref 11.5–14.5)
WBC: 7.8 10*3/uL (ref 3.8–10.6)

## 2017-08-15 LAB — LIPASE, BLOOD: LIPASE: 179 U/L — AB (ref 11–51)

## 2017-08-15 LAB — AMYLASE: Amylase: 149 U/L — ABNORMAL HIGH (ref 28–100)

## 2017-08-15 LAB — GLUCOSE, CAPILLARY: Glucose-Capillary: 171 mg/dL — ABNORMAL HIGH (ref 65–99)

## 2017-08-15 MED ORDER — HYDROMORPHONE HCL 2 MG PO TABS: 2.0000 mg | ORAL_TABLET | ORAL | Status: DC | PRN

## 2017-08-15 MED ORDER — HYDROMORPHONE HCL 1 MG/ML IJ SOLN
0.5000 mg | Freq: Once | INTRAMUSCULAR | Status: AC
  Administered 2017-08-15: 0.5 mg via INTRAVENOUS
  Filled 2017-08-15: qty 1

## 2017-08-15 MED ORDER — HYDROMORPHONE HCL 2 MG PO TABS
1.0000 mg | ORAL_TABLET | Freq: Once | ORAL | Status: AC
  Administered 2017-08-15: 1 mg via ORAL
  Filled 2017-08-15: qty 1

## 2017-08-15 MED ORDER — OXYCODONE HCL ER 10 MG PO T12A
10.0000 mg | EXTENDED_RELEASE_TABLET | Freq: Two times a day (BID) | ORAL | Status: DC
  Administered 2017-08-15 – 2017-08-17 (×5): 10 mg via ORAL
  Filled 2017-08-15 (×5): qty 1

## 2017-08-15 MED ORDER — HYDROMORPHONE HCL 2 MG PO TABS
1.0000 mg | ORAL_TABLET | ORAL | Status: DC | PRN
  Administered 2017-08-15 – 2017-08-16 (×3): 1 mg via ORAL
  Filled 2017-08-15 (×3): qty 1

## 2017-08-15 MED ORDER — SENNA 8.6 MG PO TABS
1.0000 | ORAL_TABLET | Freq: Every day | ORAL | Status: DC
  Administered 2017-08-15 – 2017-08-16 (×2): 8.6 mg via ORAL
  Filled 2017-08-15 (×2): qty 1

## 2017-08-15 MED ORDER — HYDROMORPHONE HCL 2 MG PO TABS: 1.0000 mg | ORAL_TABLET | Freq: Once | ORAL | Status: DC

## 2017-08-15 MED ORDER — HYDROCODONE-ACETAMINOPHEN 5-325 MG PO TABS: 1.0000 | ORAL_TABLET | ORAL | Status: DC | PRN

## 2017-08-15 NOTE — Progress Notes (Signed)
[place] [facility] [place] NAME: Travis Hawkins    MR#:  [id number]  DATE OF BIRTH:  1976-03-19  SUBJECTIVE:  CHIEF COMPLAINT:   Chief Complaint  Patient presents with  . Abdominal Pain  pt is reporting 7/10 abd pain, in tears, frustrated with pain REVIEW OF SYSTEMS:  Review of Systems  Constitutional: Negative for chills, fever and weight loss.  HENT: Negative for nosebleeds and sore throat.   Eyes: Negative for blurred vision.  Respiratory: Negative for cough, shortness of breath and wheezing.   Cardiovascular: Negative for chest pain, orthopnea, leg swelling and PND.  Gastrointestinal: Positive for abdominal pain. Negative for constipation, diarrhea, heartburn, nausea and vomiting.  Genitourinary: Negative for dysuria and urgency.  Musculoskeletal: Negative for back pain.  Skin: Negative for rash.  Neurological: Negative for dizziness, speech change, focal weakness and headaches.  Endo/Heme/Allergies: Does not bruise/bleed easily.  Psychiatric/Behavioral: Negative for depression.    DRUG ALLERGIES:   Allergies  Allergen Reactions  . Lamotrigine Other (See Comments)    DILI 2018   VITALS:  Blood pressure 114/77, pulse 61, temperature 98.2 F (36.8 C), temperature source Oral, resp. rate 18, height 5\' 8"  (1.727 m), weight 80.5 kg (177 lb 7.5 oz), SpO2 100 %. PHYSICAL EXAMINATION:  Physical Exam  Constitutional: He is oriented to person, place, and time.  HENT:  Head: Normocephalic and atraumatic.  Eyes: Pupils are equal, round, and reactive to light. Conjunctivae and EOM are normal.  Neck: Normal range of motion. Neck supple. No tracheal deviation present. No thyromegaly present.  Cardiovascular: Normal rate, regular rhythm and normal heart sounds.  Pulmonary/Chest: Effort normal and breath sounds normal. No respiratory distress. He has no wheezes. He exhibits no tenderness.  Abdominal: Soft. Bowel sounds are normal. He exhibits no  distension. There is no tenderness.  Musculoskeletal: Normal range of motion.  Neurological: He is alert and oriented to person, place, and time. No cranial nerve deficit.  Skin: Skin is warm and dry. No rash noted.   LABORATORY PANEL:  Male CBC Recent Labs  Lab 08/15/17 0601  WBC 7.8  HGB 12.7*  HCT 35.7*  PLT 300   ------------------------------------------------------------------------------------------------------------------ Chemistries  Recent Labs  Lab 08/15/17 0601  NA 137  K 4.0  CL 105  CO2 24  GLUCOSE 123*  BUN 7  CREATININE 0.76  CALCIUM 8.9  AST 20  ALT 21  ALKPHOS 126  BILITOT 0.6   RADIOLOGY:  No results found. ASSESSMENT AND PLAN:   42 y.o. 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 Patient recently admitted 07/20/17 and discharged home 08/02/2017. Notably he has had multiple prior admissions outside of the [facility] system at [facility] or through [facility] between 02/2017 and now. Being admitted for now Acute on chronic pancreatitis   * Acute on chronic recurrent Pancreatitis: pain 17/10 #h/o recurrent biliary stricture s/p stent #h/o cholecystectomy (07/29/17) - slowly improving, lipase 302->159 -->179 -Plan is to discontinue IV pain medicines and switch him to p.o. patient is agreeable Started on OxyContin every 12 hours and will continue Norco and Dilaudid as needed for moderate-to-severe pain -discussed in detail with the patient he is agreeable  pancreatic enzymes-Creon - start CLD - advance as tolerated  *  Depression/Bipolar disorder - continue zyprexa  * Abd pain - continue pain meds and mgmt of pancreatitis  * Leukocytosis -  likely due to pain/stress - monitor       All the records are reviewed and case discussed with Care Management/Social Worker. Management plans discussed with the patient, family and they are in agreement.  CODE STATUS: Full Code  TOTAL TIME TAKING CARE OF THIS PATIENT: 35 minutes.   More than 50% of the time was spent in counseling/coordination of care: YES  POSSIBLE D/C IN 1 DAYS, DEPENDING ON CLINICAL CONDITION.   Nicholes Mango M.D on 08/15/2017 at 8:16 AM  Between 7am to 6pm - Pager - [phone]  After 6pm go to [email or web address] - Technical brewer [facility]  Office  [phone]  CC: Primary care physician; Whitaker, Rolanda Jay, PA-C  Note: This dictation was prepared with Dragon dictation along with smaller phrase technology. Any transcriptional errors that result from this process are unintentional.

## 2017-08-16 DIAGNOSIS — K859 Acute pancreatitis without necrosis or infection, unspecified: Principal | ICD-10-CM

## 2017-08-16 LAB — COMPREHENSIVE METABOLIC PANEL
ALBUMIN: 3.5 g/dL (ref 3.5–5.0)
ALT: 20 U/L (ref 17–63)
AST: 20 U/L (ref 15–41)
Alkaline Phosphatase: 119 U/L (ref 38–126)
Anion gap: 8 (ref 5–15)
BUN: 7 mg/dL (ref 6–20)
CHLORIDE: 105 mmol/L (ref 101–111)
CO2: 25 mmol/L (ref 22–32)
CREATININE: 0.73 mg/dL (ref 0.61–1.24)
Calcium: 8.9 mg/dL (ref 8.9–10.3)
GFR calc Af Amer: >60 mL/min (ref >60)
GFR calc non Af Amer: >60 mL/min (ref >60)
Glucose, Bld: 148 mg/dL — ABNORMAL HIGH (ref 65–99)
POTASSIUM: 3.9 mmol/L (ref 3.5–5.1)
SODIUM: 138 mmol/L (ref 135–145)
Total Bilirubin: 0.5 mg/dL (ref 0.3–1.2)
Total Protein: 7.4 g/dL (ref 6.5–8.1)

## 2017-08-16 LAB — LIPASE, BLOOD: Lipase: 225 U/L — ABNORMAL HIGH (ref 11–51)

## 2017-08-16 LAB — GLUCOSE, CAPILLARY: Glucose-Capillary: 119 mg/dL — ABNORMAL HIGH (ref 65–99)

## 2017-08-16 NOTE — Progress Notes (Signed)
    Cephas Darby, MD [address]  [place]  [place], [place] [address]  Main: [phone]  Fax: [phone] Pager: [phone]   Subjective: Abdominal pain is improving. Tolerating clear liquid diet. Requesting for full liquid diet. Family is bedside.   Objective: Vital signs in last 24 hours: Vitals:   08/15/17 0603 08/15/17 1303 08/15/17 1937 08/16/17 0340  BP: 114/77 127/80 126/85 97/65  Pulse: 61 76 72 63  Resp:  (!) 22 18 14   Temp:  97.6 F (36.4 C) (!) 97.5 F (36.4 C) 98.6 F (37 C)  TempSrc:  Oral Oral Oral  SpO2: 100% 100% 100% 99%  Weight: 177 lb 7.5 oz (80.5 kg)   183 lb (83 kg)  Height:       Weight change: 5 lb 8.5 oz (2.508 kg)  Intake/Output Summary (Last 24 hours) at 08/16/2017 2029 Last data filed at 08/16/2017 1901 Gross per 24 hour  Intake 2643.75 ml  Output -  Net 2643.75 ml     Exam: Heart:: Regular rate and rhythm or S1S2 present Lungs: clear to auscultation Abdomen: soft, mild epigastric tenderness, normal bowel sounds   Lab Results: CBC Latest Ref Rng & Units 08/15/2017 08/14/2017 08/12/2017  WBC 3.8 - 10.6 K/uL 7.8 8.6 13.9(H)  Hemoglobin 13.0 - 18.0 g/dL 12.7(L) 11.7(L) 12.4(L)  Hematocrit 40.0 - 52.0 % 35.7(L) 34.5(L) 36.0(L)  Platelets 150 - 440 K/uL 300 324 380   CMP Latest Ref Rng & Units 08/16/2017 08/15/2017 08/14/2017  Glucose 65 - 99 mg/dL 148(H) 123(H) 98  BUN 6 - 20 mg/dL 7 7 7   Creatinine 0.61 - 1.24 mg/dL 0.73 0.76 0.69  Sodium 135 - 145 mmol/L 138 137 138  Potassium 3.5 - 5.1 mmol/L 3.9 4.0 4.1  Chloride 101 - 111 mmol/L 105 105 106  CO2 22 - 32 mmol/L 25 24 26   Calcium 8.9 - 10.3 mg/dL 8.9 8.9 8.6(L)  Total Protein 6.5 - 8.1 g/dL 7.4 7.6 6.8  Total Bilirubin 0.3 - 1.2 mg/dL 0.5 0.6 0.7  Alkaline Phos 38 - 126 U/L 119 126 114  AST 15 - 41 U/L 20 20 17   ALT 17 - 63 U/L 20 21 22     Micro Results: No results found for this or any previous visit (from the past 240 hour(s)). Studies/Results: No results  found. Medications: I have reviewed the patient's current medications. Scheduled Meds: . docusate sodium  100 mg Oral BID  . heparin  5,000 Units Subcutaneous Q8H  . lipase/protease/amylase  36,000 Units Oral TID AC  . loratadine  10 mg Oral Daily  . nicotine  14 mg Transdermal Daily  . OLANZapine  10 mg Oral QHS  . oxyCODONE  10 mg Oral Q12H  . protein supplement shake  11 oz Oral BID BM  . senna  1 tablet Oral QHS   Continuous Infusions: . sodium chloride 100 mL/hr at 08/16/17 1421   PRN Meds:.acetaminophen **OR** acetaminophen, bisacodyl, HYDROcodone-acetaminophen, HYDROmorphone, ondansetron **OR** ondansetron (ZOFRAN) IV   Assessment: Active Problems:   Acute pancreatitis  Recurrent pancreatitis, biliary stricture with biliary stent placement Admitted with mild pancreatitis There is no evidence of bile duct obstruction  Plan: Advance diet as tolerated Pain management Follow-up with advanced endoscopist and general surgery at [facility], already established care with them. He has appointment next Tuesday  Please call GI back with questions/or concerns   LOS: 3 days   Abdifatah Colquhoun 08/16/2017, 8:29 PM

## 2017-08-16 NOTE — Progress Notes (Signed)
[place] at [place] NAME: Travis Hawkins    MR#:  [id number]  DATE OF BIRTH:  06-21-1975  SUBJECTIVE:  CHIEF COMPLAINT:   Chief Complaint  Patient presents with  . Abdominal Pain  pt is reporting 2-3 /10 abd pain, reporting current pain medications are helping, want to try some clear liquid diet REVIEW OF SYSTEMS:  Review of Systems  Constitutional: Negative for chills, fever and weight loss.  HENT: Negative for nosebleeds and sore throat.   Eyes: Negative for blurred vision.  Respiratory: Negative for cough, shortness of breath and wheezing.   Cardiovascular: Negative for chest pain, orthopnea, leg swelling and PND.  Gastrointestinal: Positive for abdominal pain. Negative for constipation, diarrhea, heartburn, nausea and vomiting.  Genitourinary: Negative for dysuria and urgency.  Musculoskeletal: Negative for back pain.  Skin: Negative for rash.  Neurological: Negative for dizziness, speech change, focal weakness and headaches.  Endo/Heme/Allergies: Does not bruise/bleed easily.  Psychiatric/Behavioral: Negative for depression.    DRUG ALLERGIES:   Allergies  Allergen Reactions  . Lamotrigine Other (See Comments)    DILI 2018   VITALS:  Blood pressure 97/65, pulse 63, temperature 98.6 F (37 C), temperature source Oral, resp. rate 14, height 5\' 8"  (1.727 m), weight 83 kg (183 lb), SpO2 99 %. PHYSICAL EXAMINATION:  Physical Exam  Constitutional: He is oriented to person, place, and time.  HENT:  Head: Normocephalic and atraumatic.  Eyes: Pupils are equal, round, and reactive to light. Conjunctivae and EOM are normal.  Neck: Normal range of motion. Neck supple. No tracheal deviation present. No thyromegaly present.  Cardiovascular: Normal rate, regular rhythm and normal heart sounds.  Pulmonary/Chest: Effort normal and breath sounds normal. No respiratory distress. He has no wheezes. He exhibits no tenderness.  Abdominal:  Soft. Bowel sounds are normal. He exhibits no distension. There is no tenderness.  Musculoskeletal: Normal range of motion.  Neurological: He is alert and oriented to person, place, and time. No cranial nerve deficit.  Skin: Skin is warm and dry. No rash noted.   LABORATORY PANEL:  Male CBC Recent Labs  Lab 08/15/17 0601  WBC 7.8  HGB 12.7*  HCT 35.7*  PLT 300   ------------------------------------------------------------------------------------------------------------------ Chemistries  Recent Labs  Lab 08/16/17 0338  NA 138  K 3.9  CL 105  CO2 25  GLUCOSE 148*  BUN 7  CREATININE 0.73  CALCIUM 8.9  AST 20  ALT 20  ALKPHOS 119  BILITOT 0.5   RADIOLOGY:  No results found. ASSESSMENT AND PLAN:   42 y.o. 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 Patient recently admitted 07/20/17 and discharged home 08/02/2017. Notably he has had multiple prior admissions outside of the [facility] system at [facility] or through [facility] between 02/2017 and now. Being admitted for now Acute on chronic pancreatitis   * Acute on chronic recurrent Pancreatitis: pain  2-3 /10 #h/o recurrent biliary stricture s/p stent #h/o cholecystectomy (07/29/17) - slowly improving, lipase 302->159 -->179-->225  Started on OxyContin every 12 hours and will continue Norco and Dilaudid as needed for moderate-to-severe pain -discussed in detail with the patient he is agreeable  pancreatic enzymes-Creon - start CLD - advance as tolerated -Clear liquid diet -Discussed with GI Dr.  Vanga  * Depression/Bipolar disorder - continue zyprexa  * Abd pain - continue pain meds and mgmt of pancreatitis  *  Leukocytosis - likely due to pain/stress - monitor       All the records are reviewed and case discussed with Care Management/Social Worker. Management plans discussed with the patient, family and they are in agreement.  CODE STATUS: Full Code  TOTAL TIME TAKING CARE OF THIS PATIENT: 33 minutes.   More than 50% of the time was spent in counseling/coordination of care: YES  POSSIBLE D/C IN 1 DAYS, DEPENDING ON CLINICAL CONDITION.   Nicholes Mango M.D on 08/16/2017 at 12:39 PM  Between 7am to 6pm - Pager - [phone]  After 6pm go to [email or web address] - [employer] [facility]  Office  [phone]  CC: Primary care physician; Whitaker, Rolanda Jay, PA-C  Note: This dictation was prepared with Dragon dictation along with smaller phrase technology. Any transcriptional errors that result from this process are unintentional.

## 2017-08-17 LAB — GLUCOSE, CAPILLARY: Glucose-Capillary: 124 mg/dL — ABNORMAL HIGH (ref 65–99)

## 2017-08-17 LAB — LIPASE, BLOOD: Lipase: 162 U/L — ABNORMAL HIGH (ref 11–51)

## 2017-08-17 MED ORDER — BISACODYL 5 MG PO TBEC: 5.0000 mg | DELAYED_RELEASE_TABLET | Freq: Every day | ORAL | 0 refills | Status: DC | PRN

## 2017-08-17 MED ORDER — ACETAMINOPHEN 325 MG PO TABS: 650.0000 mg | ORAL_TABLET | Freq: Four times a day (QID) | ORAL | Status: DC | PRN

## 2017-08-17 MED ORDER — NICOTINE 14 MG/24HR TD PT24: 14.0000 mg | MEDICATED_PATCH | Freq: Every day | TRANSDERMAL | 0 refills | Status: DC

## 2017-08-17 MED ORDER — LORATADINE 10 MG PO TABS: 10.0000 mg | ORAL_TABLET | Freq: Every day | ORAL | 0 refills | Status: DC

## 2017-08-17 MED ORDER — OXYCODONE HCL ER 10 MG PO T12A: 10.0000 mg | EXTENDED_RELEASE_TABLET | Freq: Two times a day (BID) | ORAL | 0 refills | Status: DC

## 2017-08-17 MED ORDER — HYDROCODONE-ACETAMINOPHEN 5-325 MG PO TABS: 1.0000 | ORAL_TABLET | ORAL | 0 refills | Status: DC | PRN

## 2017-08-17 MED ORDER — PREMIER PROTEIN SHAKE: 11.0000 [oz_av] | Freq: Two times a day (BID) | ORAL | 0 refills | Status: DC

## 2017-08-17 MED ORDER — PANCRELIPASE (LIP-PROT-AMYL) 36000-114000 UNITS PO CPEP: 36000.0000 [IU] | ORAL_CAPSULE | Freq: Three times a day (TID) | ORAL | 0 refills | Status: AC

## 2017-08-17 MED ORDER — SENNA 8.6 MG PO TABS
1.0000 | ORAL_TABLET | Freq: Every day | ORAL | 0 refills | Status: DC
Start: 2017-08-17 — End: 2018-05-22

## 2017-08-17 NOTE — Discharge Instructions (Signed)
Follow-up with primary care physician in 5 to 7 days Follow-up at the [facility] as scheduled on Tuesday

## 2017-08-17 NOTE — Progress Notes (Signed)
Pt D/C to home. VSS. IV removed intact. Education completed. All questions answered.

## 2017-08-17 NOTE — Discharge Summary (Signed)
[place] at [place] NAME: Travis Hawkins    MR#:  [id number]  DATE OF BIRTH:  02-03-1976  DATE OF ADMISSION:  08/13/2017 ADMITTING PHYSICIAN: Max Sane, MD  DATE OF DISCHARGE: 08/17/17  PRIMARY CARE PHYSICIAN: Donnamarie Rossetti, PA-C    ADMISSION DIAGNOSIS:  Epigastric pain [R10.13] Intractable pain [R52] Acute pancreatitis, unspecified complication status, unspecified pancreatitis type [K85.90]  DISCHARGE DIAGNOSIS:  ACUTE  On chronic recurrent pancreatitis   SECONDARY DIAGNOSIS:   Past Medical History:  Diagnosis Date  . Biliary obstruction   . Depression   . Mood disorder [facility])     HOSPITAL COURSE:   HPI  Travis Hawkins  is a 42 y.o. male with a known history of Depression being admitted for acute pancreatitis. He hwas having epigastric abdominal pain and nausea. Patient has a complicated medical history including recurrent biliary obstruction status post ERCP and biliary stenting. Most recently had laparoscopic cholecystectomy on 4/1 at [facility]. He was readmitted (4/6-4/10) several days later for intractable abdominal pain. Had labs done by his PCP last week on Friday which demonstrated elevated lipase. He was hurting so bad he could not make it to his doctor's appointment and got to ED as pain was uncontrolled.  * Acuteon chronic recurrentPancreatitis:pain 2- /10 #h/o recurrent biliary stricture s/p stent #h/o cholecystectomy (07/29/17) - slowly improving, lipase 302->159 -->179-->225 --162 Pain is very well controlled with OxyContin and Norco as needed Outpatient follow-up with [facility] as scheduled on Tuesday pancreatic enzymes-Creon - start CLD - advance as tolerated -Tolerating soft diet-low-fat diet, seen by dietitian -Discussed with GI Dr. Marius Ditch  * Depression/Bipolar disorder - continue zyprexa  * Abd pain - continue pain meds and mgmt of pancreatitis  * Leukocytosis - likely due to  pain/stress -Afebrile  Discharge patient  DISCHARGE CONDITIONS:  stable  CONSULTS OBTAINED:  Treatment Team:  Virgel Manifold, MD   PROCEDURES  None   DRUG ALLERGIES:   Allergies  Allergen Reactions  . Lamotrigine Other (See Comments)    DILI 2018    DISCHARGE MEDICATIONS:   Allergies as of 08/17/2017      Reactions   Lamotrigine Other (See Comments)   DILI 2018      Medication List    STOP taking these medications   traMADol 50 MG tablet Commonly known as:  ULTRAM     TAKE these medications   acetaminophen 325 MG tablet Commonly known as:  TYLENOL Take 2 tablets (650 mg total) by mouth every 6 (six) hours as needed for mild pain (or Fever >/= 101).   bisacodyl 5 MG EC tablet Commonly known as:  DULCOLAX Take 1 tablet (5 mg total) by mouth daily as needed for moderate constipation.   HYDROcodone-acetaminophen 5-325 MG tablet Commonly known as:  NORCO/VICODIN Take 1 tablet by mouth every 4 (four) hours as needed for moderate pain. What changed:  when to take this   lipase/protease/amylase 36000 UNITS Cpep capsule Commonly known as:  CREON Take 1 capsule (36,000 Units total) by mouth 3 (three) times daily before meals for 90 doses. What changed:    how much to take  how to take this  when to take this  additional instructions   loratadine 10 MG tablet Commonly known as:  CLARITIN Take 1 tablet (10 mg total) by mouth daily. Start taking on:  08/18/2017   nicotine 14 mg/24hr patch Commonly known as:  NICODERM CQ - dosed in mg/24 hours Place 1 patch (14  mg total) onto the skin daily. Start taking on:  08/18/2017   OLANZapine 10 MG tablet Commonly known as:  ZYPREXA Take 10 mg by mouth at bedtime.   oxyCODONE 10 mg 12 hr tablet Commonly known as:  OXYCONTIN Take 1 tablet (10 mg total) by mouth every 12 (twelve) hours.   protein supplement shake Liqd Commonly known as:  PREMIER PROTEIN Take 325 mLs (11 oz total) by mouth 2 (two) times  daily between meals.   senna 8.6 MG Tabs tablet Commonly known as:  SENOKOT Take 1 tablet (8.6 mg total) by mouth at bedtime.        DISCHARGE INSTRUCTIONS:   Follow-up with primary care physician in 5 to 7 days Follow-up at the [facility] as scheduled on Tuesday  DIET:  Low fat, Low cholesterol diet  DISCHARGE CONDITION:  Fair  ACTIVITY:  Activity as tolerated  OXYGEN:  Home Oxygen: No.   Oxygen Delivery: room air  DISCHARGE LOCATION:  home   If you experience worsening of your admission symptoms, develop shortness of breath, life threatening emergency, suicidal or homicidal thoughts you must seek medical attention immediately by calling 911 or calling your MD immediately  if symptoms less severe.  You Must read complete instructions/literature along with all the possible adverse reactions/side effects for all the Medicines you take and that have been prescribed to you. Take any new Medicines after you have completely understood and accpet all the possible adverse reactions/side effects.   Please note  You were cared for by a hospitalist during your hospital stay. If you have any questions about your discharge medications or the care you received while you were in the hospital after you are discharged, you can call the unit and asked to speak with the hospitalist on call if the hospitalist that took care of you is not available. Once you are discharged, your primary care physician will handle any further medical issues. Please note that NO REFILLS for any discharge medications will be authorized once you are discharged, as it is imperative that you return to your primary care physician (or establish a relationship with a primary care physician if you do not have one) for your aftercare needs so that they can reassess your need for medications and monitor your lab values.     Today  Chief Complaint  Patient presents with  . Abdominal Pain   Patient is resting comfortably.   Tolerating diet, abdominal pain significantly improved at around 0- 2 out of 10, wants to go home.  Has an appointment with [facility] facility on Tuesday   ROS:  CONSTITUTIONAL: Denies fevers, chills. Denies any fatigue, weakness.  EYES: Denies blurry vision, double vision, eye pain. EARS, NOSE, THROAT: Denies tinnitus, ear pain, hearing loss. RESPIRATORY: Denies cough, wheeze, shortness of breath.  CARDIOVASCULAR: Denies chest pain, palpitations, edema.  GASTROINTESTINAL: Denies nausea, vomiting, diarrhea, abdominal pain. Denies bright red blood per rectum. GENITOURINARY: Denies dysuria, hematuria. ENDOCRINE: Denies nocturia or thyroid problems. HEMATOLOGIC AND LYMPHATIC: Denies easy bruising or bleeding. SKIN: Denies rash or lesion. MUSCULOSKELETAL: Denies pain in neck, back, shoulder, knees, hips or arthritic symptoms.  NEUROLOGIC: Denies paralysis, paresthesias.  PSYCHIATRIC: Denies anxiety or depressive symptoms.   VITAL SIGNS:  Blood pressure 119/73, pulse 79, temperature 98.1 F (36.7 C), temperature source Oral, resp. rate 14, height 5\' 8"  (1.727 m), weight 80.8 kg (178 lb 2.1 oz), SpO2 100 %.  I/O:    Intake/Output Summary (Last 24 hours) at 08/17/2017 1253 Last data filed at  08/17/2017 1027 Gross per 24 hour  Intake 4668.75 ml  Output -  Net 4668.75 ml    PHYSICAL EXAMINATION:  GENERAL:  42 y.o.-year-old patient lying in the bed with no acute distress.  EYES: Pupils equal, round, reactive to light and accommodation. No scleral icterus. Extraocular muscles intact.  HEENT: Head atraumatic, normocephalic. Oropharynx and nasopharynx clear.  NECK:  Supple, no jugular venous distention. No thyroid enlargement, no tenderness.  LUNGS: Normal breath sounds bilaterally, no wheezing, rales,rhonchi or crepitation. No use of accessory muscles of respiration.  CARDIOVASCULAR: S1, S2 normal. No murmurs, rubs, or gallops.  ABDOMEN: Soft, non-tender, non-distended. Bowel sounds present. No  organomegaly or mass.  EXTREMITIES: No pedal edema, cyanosis, or clubbing.  NEUROLOGIC: Cranial nerves II through XII are intact. Muscle strength 5/5 in all extremities. Sensation intact. Gait not checked.  PSYCHIATRIC: The patient is alert and oriented x 3.  SKIN: No obvious rash, lesion, or ulcer.   DATA REVIEW:   CBC Recent Labs  Lab 08/15/17 0601  WBC 7.8  HGB 12.7*  HCT 35.7*  PLT 300    Chemistries  Recent Labs  Lab 08/16/17 0338  NA 138  K 3.9  CL 105  CO2 25  GLUCOSE 148*  BUN 7  CREATININE 0.73  CALCIUM 8.9  AST 20  ALT 20  ALKPHOS 119  BILITOT 0.5    Cardiac Enzymes No results for input(s): TROPONINI in the last 168 hours.  Microbiology Results  Results for orders placed or performed during the hospital encounter of 03/13/17  Blood culture (routine x 2)     Status: None   Collection Time: 03/13/17  6:45 PM  Result Value Ref Range Status   Specimen Description BLOOD LEFT AC  Final   Special Requests   Final    BOTTLES DRAWN AEROBIC AND ANAEROBIC Blood Culture results may not be optimal due to an excessive volume of blood received in culture bottles   Culture NO GROWTH 5 DAYS  Final   Report Status 03/18/2017 FINAL  Final  Blood culture (routine x 2)     Status: None   Collection Time: 03/13/17  6:45 PM  Result Value Ref Range Status   Specimen Description BLOOD LEFT FA  Final   Special Requests   Final    BOTTLES DRAWN AEROBIC AND ANAEROBIC Blood Culture adequate volume   Culture NO GROWTH 5 DAYS  Final   Report Status 03/18/2017 FINAL  Final    RADIOLOGY:  No results found.  EKG:   Orders placed or performed during the hospital encounter of 07/18/17  . EKG 12-Lead  . EKG 12-Lead  . ED EKG  . ED EKG  . EKG      Management plans discussed with the patient, family and they are in agreement.  CODE STATUS:     Code Status Orders  (From admission, onward)        Start     Ordered   08/13/17 1202  Full code  Continuous      08/13/17 1201    Code Status History    Date Active Date Inactive Code Status Order ID Comments User Context   05/13/2017 1956 05/14/2017 2114 Full Code [id number]  Demetrios Loll, MD Inpatient   03/13/2017 1900 03/20/2017 2030 Full Code [id number]  Loletha Grayer, MD ED   03/09/2017 0448 03/12/2017 1716 Full Code [id number]  Saundra Shelling, MD Inpatient      TOTAL TIME TAKING CARE OF THIS PATIENT: 45  minutes.  Note: This dictation was prepared with Dragon dictation along with smaller phrase technology. Any transcriptional errors that result from this process are unintentional.   @[facility] @  on 08/17/2017 at 12:53 PM  Between 7am to 6pm - Pager - [phone]  After 6pm go to [email or web address] - password [email or web address] [facility]  [facility]  Office  [phone]  CC: Primary care physician; Whitaker, [employer], PA-C

## 2017-08-17 NOTE — Plan of Care (Signed)
  Problem: Education: Goal: Knowledge of General Education information will improve Outcome: Progressing   Problem: Education: Goal: Knowledge of Pancreatitis treatment and prevention will improve Outcome: Progressing   Problem: Nutritional: Goal: Ability to achieve adequate nutritional intake will improve Outcome: Progressing   Problem: Clinical Measurements: Goal: Complications related to the disease process, condition or treatment will be avoided or minimized Outcome: Progressing

## 2017-08-17 NOTE — Progress Notes (Signed)
Patient's OxyContin needs prior authorization I have called [facility] patient's ID number is [id number], discussed with  Luciano Cutter regarding prior authorization, she has stated that all PA's are gone tonight and not working until Monday and patient has to pay from his pocket if he needs this medicine tonight and they will reimburse it later.  Patient was called and notified regarding my conversation with the [employer]  Total time spent- 34 min

## 2017-08-20 ENCOUNTER — Telehealth: Payer: Self-pay

## 2017-08-20 NOTE — Telephone Encounter (Signed)
Flagged on EMMI report for not having a follow up scheduled.  1st attempt to reach patient made 08/20/17 at 3:40pm, however unavailable.  Voicemail left encouraging a callback.  Will attempt at later time.

## 2017-08-21 NOTE — Telephone Encounter (Signed)
Reached patient upon 2nd attempt.  He mentioned he has a follow up appointment scheduled at his PCP tomorrow (08/22/17).  No further questions or concerns at this time regarding his discharge.  I thanked him for his time and informed him that he would receive one more automated call in the next few days checking on him a final time.

## 2017-09-05 DIAGNOSIS — C250 Malignant neoplasm of head of pancreas: Secondary | ICD-10-CM | POA: Diagnosis present

## 2017-11-28 DIAGNOSIS — Z1379 Encounter for other screening for genetic and chromosomal anomalies: Secondary | ICD-10-CM | POA: Insufficient documentation

## 2018-01-02 DIAGNOSIS — F312 Bipolar disorder, current episode manic severe with psychotic features: Secondary | ICD-10-CM | POA: Insufficient documentation

## 2018-04-21 DIAGNOSIS — K920 Hematemesis: Secondary | ICD-10-CM | POA: Insufficient documentation

## 2018-05-14 ENCOUNTER — Emergency Department
Admission: EM | Admit: 2018-05-14 | Discharge: 2018-05-19 | Disposition: A | Discharge status: Other Acute Inpatient Hospital | Payer: Medicaid Other | Attending: Emergency Medicine | Admitting: Emergency Medicine

## 2018-05-14 ENCOUNTER — Other Ambulatory Visit: Payer: Self-pay

## 2018-05-14 ENCOUNTER — Ambulatory Visit: Payer: Medicaid Other | Admitting: Psychiatry

## 2018-05-14 DIAGNOSIS — Z87891 Personal history of nicotine dependence: Secondary | ICD-10-CM | POA: Insufficient documentation

## 2018-05-14 DIAGNOSIS — E119 Type 2 diabetes mellitus without complications: Secondary | ICD-10-CM | POA: Insufficient documentation

## 2018-05-14 DIAGNOSIS — F3113 Bipolar disorder, current episode manic without psychotic features, severe: Secondary | ICD-10-CM | POA: Diagnosis not present

## 2018-05-14 DIAGNOSIS — C259 Malignant neoplasm of pancreas, unspecified: Secondary | ICD-10-CM | POA: Insufficient documentation

## 2018-05-14 DIAGNOSIS — C250 Malignant neoplasm of head of pancreas: Secondary | ICD-10-CM | POA: Diagnosis present

## 2018-05-14 DIAGNOSIS — F319 Bipolar disorder, unspecified: Secondary | ICD-10-CM

## 2018-05-14 DIAGNOSIS — K861 Other chronic pancreatitis: Secondary | ICD-10-CM

## 2018-05-14 DIAGNOSIS — Z794 Long term (current) use of insulin: Secondary | ICD-10-CM | POA: Insufficient documentation

## 2018-05-14 DIAGNOSIS — Z046 Encounter for general psychiatric examination, requested by authority: Secondary | ICD-10-CM | POA: Diagnosis present

## 2018-05-14 DIAGNOSIS — Z79899 Other long term (current) drug therapy: Secondary | ICD-10-CM | POA: Insufficient documentation

## 2018-05-14 DIAGNOSIS — F23 Brief psychotic disorder: Secondary | ICD-10-CM

## 2018-05-14 LAB — COMPREHENSIVE METABOLIC PANEL
ALBUMIN: 4.4 g/dL (ref 3.5–5.0)
ALT: 215 U/L — ABNORMAL HIGH (ref 0–44)
AST: 110 U/L — ABNORMAL HIGH (ref 15–41)
Alkaline Phosphatase: 325 U/L — ABNORMAL HIGH (ref 38–126)
Anion gap: 8 (ref 5–15)
BILIRUBIN TOTAL: 0.6 mg/dL (ref 0.3–1.2)
BUN: <5 mg/dL — ABNORMAL LOW (ref 6–20)
CALCIUM: 9.7 mg/dL (ref 8.9–10.3)
CO2: 26 mmol/L (ref 22–32)
Chloride: 104 mmol/L (ref 98–111)
Creatinine, Ser: 0.68 mg/dL (ref 0.61–1.24)
GFR calc non Af Amer: >60 mL/min (ref >60)
GLUCOSE: 174 mg/dL — AB (ref 70–99)
POTASSIUM: 3.6 mmol/L (ref 3.5–5.1)
SODIUM: 138 mmol/L (ref 135–145)
TOTAL PROTEIN: 8.2 g/dL — AB (ref 6.5–8.1)

## 2018-05-14 LAB — URINE DRUG SCREEN, QUALITATIVE (ARMC ONLY)
Amphetamines, Ur Screen: NOT DETECTED
BENZODIAZEPINE, UR SCRN: NOT DETECTED
Barbiturates, Ur Screen: NOT DETECTED
Cannabinoid 50 Ng, Ur ~~LOC~~: POSITIVE — AB
Cocaine Metabolite,Ur ~~LOC~~: NOT DETECTED
MDMA (Ecstasy)Ur Screen: NOT DETECTED
METHADONE SCREEN, URINE: NOT DETECTED
OPIATE, UR SCREEN: NOT DETECTED
Phencyclidine (PCP) Ur S: NOT DETECTED
Tricyclic, Ur Screen: NOT DETECTED

## 2018-05-14 LAB — CBC
HCT: 34.1 % — ABNORMAL LOW (ref 39.0–52.0)
Hemoglobin: 11 g/dL — ABNORMAL LOW (ref 13.0–17.0)
MCH: 31.6 pg (ref 26.0–34.0)
MCHC: 32.3 g/dL (ref 30.0–36.0)
MCV: 98 fL (ref 80.0–100.0)
Platelets: 147 10*3/uL — ABNORMAL LOW (ref 150–400)
RBC: 3.48 MIL/uL — ABNORMAL LOW (ref 4.22–5.81)
RDW: 13.8 % (ref 11.5–15.5)
WBC: 7 10*3/uL (ref 4.0–10.5)
nRBC: 0 % (ref 0.0–0.2)

## 2018-05-14 LAB — ETHANOL: Alcohol, Ethyl (B): <10 mg/dL (ref <10)

## 2018-05-14 LAB — GLUCOSE, CAPILLARY: Glucose-Capillary: 241 mg/dL — ABNORMAL HIGH (ref 70–99)

## 2018-05-14 LAB — ACETAMINOPHEN LEVEL: Acetaminophen (Tylenol), Serum: <10 ug/mL — ABNORMAL LOW (ref 10–30)

## 2018-05-14 LAB — SALICYLATE LEVEL: Salicylate Lvl: <7 mg/dL (ref 2.8–30.0)

## 2018-05-14 MED ORDER — INSULIN ASPART PROT & ASPART (70-30 MIX) 100 UNIT/ML ~~LOC~~ SUSP
8.0000 [IU] | Freq: Three times a day (TID) | SUBCUTANEOUS | Status: DC
  Administered 2018-05-15: 8 [IU] via SUBCUTANEOUS
  Filled 2018-05-14 (×2): qty 10

## 2018-05-14 MED ORDER — SENNA 8.6 MG PO TABS
1.0000 | ORAL_TABLET | Freq: Every day | ORAL | Status: DC
  Administered 2018-05-14 – 2018-05-18 (×5): 8.6 mg via ORAL
  Filled 2018-05-14 (×6): qty 1

## 2018-05-14 MED ORDER — OLANZAPINE 5 MG PO TABS
15.0000 mg | ORAL_TABLET | Freq: Every day | ORAL | Status: DC
  Administered 2018-05-14 – 2018-05-18 (×5): 15 mg via ORAL
  Filled 2018-05-14 (×5): qty 1

## 2018-05-14 MED ORDER — CLARITHROMYCIN 500 MG PO TABS
500.0000 mg | ORAL_TABLET | Freq: Two times a day (BID) | ORAL | Status: DC
  Administered 2018-05-14 – 2018-05-19 (×10): 500 mg via ORAL
  Filled 2018-05-14 (×12): qty 1

## 2018-05-14 MED ORDER — AMOXICILLIN 500 MG PO CAPS
1000.0000 mg | ORAL_CAPSULE | Freq: Two times a day (BID) | ORAL | Status: DC
  Administered 2018-05-14 – 2018-05-19 (×10): 1000 mg via ORAL
  Filled 2018-05-14 (×12): qty 2

## 2018-05-14 MED ORDER — HYDROCODONE-ACETAMINOPHEN 5-325 MG PO TABS: 1.0000 | ORAL_TABLET | ORAL | Status: DC | PRN

## 2018-05-14 MED ORDER — NICOTINE 14 MG/24HR TD PT24
14.0000 mg | MEDICATED_PATCH | Freq: Every day | TRANSDERMAL | Status: DC
  Administered 2018-05-15 – 2018-05-19 (×5): 14 mg via TRANSDERMAL
  Filled 2018-05-14 (×5): qty 1

## 2018-05-14 MED ORDER — PREMIER PROTEIN SHAKE
11.0000 [oz_av] | Freq: Two times a day (BID) | ORAL | Status: DC
  Administered 2018-05-16: 11 [oz_av] via ORAL

## 2018-05-14 MED ORDER — BISACODYL 5 MG PO TBEC
5.0000 mg | DELAYED_RELEASE_TABLET | Freq: Every day | ORAL | Status: DC | PRN
  Filled 2018-05-14: qty 1

## 2018-05-14 MED ORDER — INSULIN ASPART 100 UNIT/ML ~~LOC~~ SOLN
0.0000 [IU] | Freq: Three times a day (TID) | SUBCUTANEOUS | Status: DC
  Administered 2018-05-15: 3 [IU] via SUBCUTANEOUS
  Administered 2018-05-16: 1 [IU] via SUBCUTANEOUS
  Administered 2018-05-16 – 2018-05-17 (×2): 2 [IU] via SUBCUTANEOUS
  Administered 2018-05-17: 1 [IU] via SUBCUTANEOUS
  Administered 2018-05-18 – 2018-05-19 (×3): 2 [IU] via SUBCUTANEOUS
  Administered 2018-05-19: 1 [IU] via SUBCUTANEOUS
  Filled 2018-05-14 (×8): qty 1

## 2018-05-14 MED ORDER — ZIPRASIDONE MESYLATE 20 MG IM SOLR
20.0000 mg | Freq: Once | INTRAMUSCULAR | Status: AC
  Administered 2018-05-14: 20 mg via INTRAMUSCULAR
  Filled 2018-05-14: qty 20

## 2018-05-14 MED ORDER — PANCRELIPASE (LIP-PROT-AMYL) 12000-38000 UNITS PO CPEP
24000.0000 [IU] | ORAL_CAPSULE | Freq: Three times a day (TID) | ORAL | Status: DC
  Administered 2018-05-14 – 2018-05-16 (×6): 24000 [IU] via ORAL
  Filled 2018-05-14 (×9): qty 2

## 2018-05-14 MED ORDER — ACETAMINOPHEN 325 MG PO TABS
650.0000 mg | ORAL_TABLET | Freq: Four times a day (QID) | ORAL | Status: DC | PRN
  Administered 2018-05-14: 650 mg via ORAL
  Filled 2018-05-14: qty 2

## 2018-05-14 MED ORDER — LORAZEPAM 0.5 MG PO TABS
0.5000 mg | ORAL_TABLET | Freq: Every evening | ORAL | Status: DC | PRN
  Filled 2018-05-14: qty 1

## 2018-05-14 MED ORDER — LORATADINE 10 MG PO TABS
10.0000 mg | ORAL_TABLET | Freq: Every day | ORAL | Status: DC
  Administered 2018-05-16 – 2018-05-19 (×4): 10 mg via ORAL
  Filled 2018-05-14 (×5): qty 1

## 2018-05-14 NOTE — BH Assessment (Signed)
Unable to complete TTS assessment; pt received an injection to ensure safety.  Will make another attempt to complete assessment when pt is calmer and willing to participate.

## 2018-05-14 NOTE — ED Notes (Signed)
Pt. Introduced to unit.  Pt. Advised of cameras in unit and 15 min. Checks.  Pt. Is calm and cooperative.  Pt. Requested and was given drink and snack.

## 2018-05-14 NOTE — ED Notes (Signed)
IVC/ Consult completed/ Plan to admit to BMU

## 2018-05-14 NOTE — ED Notes (Signed)
Pt states that his wife harasses him calling him stupid and crazy, states it is his wife's fault that he sat in prison for 3 months. Pt states he had a restraining order against his wife but due to being in jail was unable to appear in court and the order got dropped. Pt states this morning he was trying to get his wife IVC'ed this morning d/t her stating that she is Eve and 43 years old.

## 2018-05-14 NOTE — ED Notes (Signed)
[place] patient cousin called to check on patient.  Brayton Layman was told pt. Is sleeping and is calm.  Brayton Layman wanted to be called if patient is transferred. [phone].

## 2018-05-14 NOTE — ED Notes (Signed)
Pt ambulatory to restroom

## 2018-05-14 NOTE — ED Provider Notes (Signed)
-----------------------------------------   11:59 PM on 05/14/2018 -----------------------------------------   Blood pressure (!) 128/94, pulse 84, temperature 97.9 F (36.6 C), temperature source Oral, resp. rate 16, height 1.727 m (5\' 8" ), weight 65.8 kg, SpO2 99 %.  The patient is calm and cooperative at this time.  There have been no acute events since the last update.  He has been medically cleared since his initial evaluation in the emergency department.  Dr. Leverne Humbles with the behavioral medicine service evaluated the patient and put in admission orders had about 6:15 PM this evening.  The patient is still awaiting a bed downstairs.      Hinda Kehr, MD 05/15/18 0000

## 2018-05-14 NOTE — ED Notes (Signed)
Pt is currently in the hallway threatening to "give y'all a reason to admit me". Pt informed that security is present. Pt is currently yelling and saying he doesn't care what this RN does to him, pt stating he has cancer and bipolar and there's nothing we can do to him because we have already ruined his whole day. Pt quiet at this point, still pacing near doorway.

## 2018-05-14 NOTE — ED Notes (Signed)
Pt states "y'all stupid, y'all fuckin stupid."

## 2018-05-14 NOTE — ED Notes (Signed)
Behavioral Health Rounding:  Pt sleeping: yes Pt alert: n/a Behavior appropriate: yes Nutrition/fluids offered: no Toileting and hygiene offered: no Sitter Present: q15 minute rounding Law enforcement present: yes

## 2018-05-14 NOTE — ED Notes (Signed)
Report to include situation, background, assessment and recommendations from RN Amy. Patient sleeping, respirations regular and unlabored. Q15 minute rounds and security camera observation to continue.

## 2018-05-14 NOTE — ED Provider Notes (Signed)
[facility] Emergency Department Provider Note  ____________________________________________  Time seen: Approximately 3:40 PM  I have reviewed the triage vital signs and the nursing notes.   HISTORY  Chief Complaint Psychiatric Evaluation  Level 5 Caveat: Portions of the History and Physical including HPI and review of systems are unable to be completely obtained due to patient being a poor historian    HPI Travis Hawkins is a 43 y.o. male with a history of diabetes and pancreatic cancer who comes to the ED under involuntary commitment due to disturbance with his wife.  He denies SI HI or hallucinations, but IVC paperwork indicates the patient was belligerent, threatening to kill his wife, and seem to be having racing/fleeting thoughts.      Past Medical History:  Diagnosis Date  . Biliary obstruction   . Depression   . Mood disorder [facility])      Patient Active Problem List   Diagnosis Date Noted  . Obstruction of bile duct   . Encounter for fit/adjst of GI appliance and device   . Presence of other specified functional implants   . Cholestasis 04/03/2017  . Elevated LFTs 04/03/2017  . Jaundice, hepatocellular   . Encounter for fitting and adjustment of other gastrointestinal appliance and device   . Acute acalculous cholecystitis 03/13/2017  . Acute pancreatitis   . Transaminitis   . Calculus of bile duct without cholecystitis and without obstruction   . Other specified diseases of biliary tract   . Elevated liver enzymes   . Biliary obstruction 03/09/2017     Past Surgical History:  Procedure Laterality Date  . CHOLECYSTECTOMY    . ENDOSCOPIC RETROGRADE CHOLANGIOPANCREATOGRAPHY (ERCP) WITH PROPOFOL N/A 03/11/2017   Procedure: ENDOSCOPIC RETROGRADE CHOLANGIOPANCREATOGRAPHY (ERCP) WITH PROPOFOL;  Surgeon: Lucilla Lame, MD;  Location: [facility] ENDOSCOPY;  Service: Endoscopy;  Laterality: N/A;  . ERCP N/A 03/19/2017   Procedure: ENDOSCOPIC  RETROGRADE CHOLANGIOPANCREATOGRAPHY (ERCP);  Surgeon: Lucilla Lame, MD;  Location: [facility] ENDOSCOPY;  Service: Endoscopy;  Laterality: N/A;  . ERCP N/A 06/25/2017   Procedure: ENDOSCOPIC RETROGRADE CHOLANGIOPANCREATOGRAPHY (ERCP);  Surgeon: Lucilla Lame, MD;  Location: [facility] ENDOSCOPY;  Service: Endoscopy;  Laterality: N/A;  . ERCP N/A 07/16/2017   Procedure: ENDOSCOPIC RETROGRADE CHOLANGIOPANCREATOGRAPHY (ERCP);  Surgeon: Lucilla Lame, MD;  Location: [facility] ENDOSCOPY;  Service: Endoscopy;  Laterality: N/A;  . none       Prior to Admission medications   Medication Sig Start Date End Date Taking? Authorizing Provider  acetaminophen (TYLENOL) 325 MG tablet Take 2 tablets (650 mg total) by mouth every 6 (six) hours as needed for mild pain (or Fever >/= 101). 08/17/17   Gouru, Illene Silver, MD  bisacodyl (DULCOLAX) 5 MG EC tablet Take 1 tablet (5 mg total) by mouth daily as needed for moderate constipation. 08/17/17   Nicholes Mango, MD  HYDROcodone-acetaminophen (NORCO/VICODIN) 5-325 MG tablet Take 1 tablet by mouth every 4 (four) hours as needed for moderate pain. 08/17/17   Nicholes Mango, MD  loratadine (CLARITIN) 10 MG tablet Take 1 tablet (10 mg total) by mouth daily. 08/18/17   Gouru, Illene Silver, MD  nicotine (NICODERM CQ - DOSED IN MG/24 HOURS) 14 mg/24hr patch Place 1 patch (14 mg total) onto the skin daily. 08/18/17   Gouru, Illene Silver, MD  OLANZapine (ZYPREXA) 10 MG tablet Take 10 mg by mouth at bedtime.  04/11/17   [provider]  oxyCODONE (OXYCONTIN) 10 mg 12 hr tablet Take 1 tablet (10 mg total) by mouth every 12 (twelve) hours. 08/17/17  Nicholes Mango, MD  protein supplement shake (PREMIER PROTEIN) LIQD Take 325 mLs (11 oz total) by mouth 2 (two) times daily between meals. 08/17/17   Nicholes Mango, MD  senna (SENOKOT) 8.6 MG TABS tablet Take 1 tablet (8.6 mg total) by mouth at bedtime. 08/17/17   Nicholes Mango, MD     Allergies Lamotrigine   Family History  Problem Relation Age of Onset  . Cancer  Maternal Aunt   . Breast cancer Maternal Grandmother   . Lung cancer Maternal Grandfather   . Diabetes Mellitus II Mother     Social History Social History   Tobacco Use  . Smoking status: Former Smoker    Packs/day: 1.00    Years: 18.00    Pack years: 18.00    Types: E-cigarettes  . Smokeless tobacco: Never Used  Substance Use Topics  . Alcohol use: No    Frequency: Never    Comment: Occasional wine about every 3-4 months  . Drug use: No    Review of Systems  Constitutional:   No fever or chills.  ENT:   No sore throat. No rhinorrhea. Cardiovascular:   No chest pain or syncope. Respiratory:   No dyspnea or cough. Gastrointestinal:   Negative for abdominal pain, vomiting and diarrhea.  Musculoskeletal:   Chronic back pain. All other systems reviewed and are negative except as documented above in ROS and HPI.  ____________________________________________   PHYSICAL EXAM:  VITAL SIGNS: ED Triage Vitals  Enc Vitals Group     BP 05/14/18 1312 (!) 128/94     Pulse Rate 05/14/18 1312 84     Resp 05/14/18 1312 16     Temp 05/14/18 1312 97.9 F (36.6 C)     Temp Source 05/14/18 1312 Oral     SpO2 05/14/18 1312 99 %     Weight 05/14/18 1313 145 lb (65.8 kg)     Height 05/14/18 1313 5\' 8"  (1.727 m)     Head Circumference --      Peak Flow --      Pain Score 05/14/18 1316 9     Pain Loc --      Pain Edu? --      Excl. in College? --     Vital signs reviewed, nursing assessments reviewed.   Constitutional:   Alert and oriented. Non-toxic appearance. Eyes:   Conjunctivae are normal. EOMI. ENT      Head:   Normocephalic and atraumatic.       Neck:   No meningismus. Full ROM. Hematological/Lymphatic/Immunilogical:   No cervical lymphadenopathy. Cardiovascular:   RRR. Symmetric bilateral radial and DP pulses.  No murmurs. Cap refill less than 2 seconds. Respiratory:   Normal respiratory effort without tachypnea/retractions. Breath sounds are clear and equal bilaterally.  No wheezes/rales/rhonchi. Gastrointestinal:   Soft and nontender. Non distended. There is no CVA tenderness.  No rebound, rigidity, or guarding.  Musculoskeletal:   Normal range of motion in all extremities. No joint effusions.  No lower extremity tenderness.  No edema. Neurologic:   Normal speech and language.  Motor grossly intact. No acute focal neurologic deficits are appreciated.  Skin:    Skin is warm, dry and intact.  No significant wounds ____________________________________________    LABS (pertinent positives/negatives) (all labs ordered are listed, but only abnormal results are displayed) Labs Reviewed  COMPREHENSIVE METABOLIC PANEL - Abnormal; Notable for the following components:      Result Value   Glucose, Bld 174 (*)    BUN <5 (*)  Total Protein 8.2 (*)    AST 110 (*)    ALT 215 (*)    Alkaline Phosphatase 325 (*)    All other components within normal limits  ACETAMINOPHEN LEVEL - Abnormal; Notable for the following components:   Acetaminophen (Tylenol), Serum <10 (*)    All other components within normal limits  CBC - Abnormal; Notable for the following components:   RBC 3.48 (*)    Hemoglobin 11.0 (*)    HCT 34.1 (*)    Platelets 147 (*)    All other components within normal limits  URINE DRUG SCREEN, QUALITATIVE ([facility] ONLY) - Abnormal; Notable for the following components:   Cannabinoid 50 Ng, Ur [place] POSITIVE (*)    All other components within normal limits  ETHANOL  SALICYLATE LEVEL   ____________________________________________   EKG    ____________________________________________    RADIOLOGY  No results found.  ____________________________________________   PROCEDURES Procedures  ____________________________________________    CLINICAL IMPRESSION / ASSESSMENT AND PLAN / ED COURSE  Pertinent labs & imaging results that were available during my care of the patient were reviewed by me and considered in my medical decision making  (see chart for details).    Patient presents under involuntary commitment, symptoms of acute psychosis/mania.  Currently calm.  Continue IVC pending psych consult.  Medically stable.      ____________________________________________   FINAL CLINICAL IMPRESSION(S) / ED DIAGNOSES    Final diagnoses:  Acute psychosis [facility])     ED Discharge Orders    None      Portions of this note were generated with dragon dictation software. Dictation errors may occur despite best attempts at proofreading.   Carrie Mew, MD 05/14/18 [phone]

## 2018-05-14 NOTE — ED Triage Notes (Signed)
He arrives today via [facility] from [facility]   Pt reports  "I am doing okay - my wife had the sheriff come to the house today and IVC'd me - I don't know why - I have been at [facility] for four hours and they have done nothing but sent me over here."  He denies SI/HI  He denies AH/VH  Blaming his wife for being here  His mother recently passed away

## 2018-05-14 NOTE — ED Notes (Signed)
Hourly rounding reveals patient in room. No complaints, stable, in no acute distress. Q15 minute rounds and monitoring via Security Cameras to continue. 

## 2018-05-14 NOTE — Consult Note (Signed)
[place] Psychiatry Consult   Reason for Consult: Bipolar mania Referring Physician: Dr. Archie Balboa Patient Identification: ABDALLA NARAMORE MRN:  [id number] Principal Diagnosis: <principal problem not specified> Diagnosis:  Active Problems:   * No active hospital problems. *   Total Time spent with patient: 45 minutes  Subjective: "I am not supposed to be here, I was trying to IVC my wife.  I have to get out I have a record company and they show tonight."  HPI: BRONC BROSSEAU is a 43 y.o. male with a history of bipolar 1 disorder and aggression with mania.  Patient states he is "tormented by his wife every day of his life.  He reports that he tried to put her under involuntary commitment for telling him that she was even and 43 years old and has been rolling the earth with her husband and the devil."  Patient states that he had been in jail until 3 days before Christmas.  He reports he has been trying to avoid his wife, but returned home this morning to take his 14 year old stepdaughter to school when his wife started telling him these things.  Patient states he went to the magistrate and admitted that he had been smoking marijuana, and that he was advised by the magistrate to go home and calm things down.  In that time, he reports his wife called the police to have him placed under involuntary commitment.  He was taken to [facility] where he became agitated.  He states he has worked for [employer] as a Teacher, adult education, his wife's ex-husband works there and is also been placed under involuntary commitment by her in the past.  While at [employer] he began screaming and cursing saying that he would "smack the shit out of her".  Patient describes that he was first diagnosed with bipolar 1 disorder in his 39s and was treated at [facility] for 8 months.  He states after being discharged from the state hospital he worked for 23 years.  He states he "has a Therapist, music, worked as a Librarian, academic at [employer], held  multiple other jobs, and most recently has a Doctor, general practice, is a Scientist, clinical (histocompatibility and immunogenetics), and has a Oceanographer.  He is concerned that a lot of people are looking for him in order to put on the show tonight." Patient denies suicidal and homicidal ideation.  However he did state that "I hate this world, and I would rather be dead."  Patient reports he had previously been seen by a psychiatrist, Raechel Chute in [place], [place], however his wife ruined that relationship.  He states he has been diagnosed with pancreatic cancer and is now followed by [facility] oncologist.  His PCP is at [facility] clinic, Dr. Venetia Maxon, who he states is able to prescribe him psychotropic medication.  He reports he is not requested Dr. Venetia Maxon to do this at this point, because he is still been getting the medication from Dr. Deanna Artis.  He reports compliance with medication. On evaluation, patient is able to remain calm and cooperative with good eye contact.  He request his medication, Creon for his abdominal pain since he has already eaten.  And for his insulin management for his diabetes.  Past Psychiatric History: Bipolar 1 with mania  Risk to Self:  Denies Risk to Others:  Yes Prior Inpatient Therapy:  Yes, [facility] over 20 years ago. Prior Outpatient Therapy:  Yes, most recent psychiatrist [place] Strickland in [place], [place]  Past Medical History:  Past Medical History:  Diagnosis Date  . Biliary obstruction   . Depression   . Mood disorder [facility])     Past Surgical History:  Procedure Laterality Date  . CHOLECYSTECTOMY    . ENDOSCOPIC RETROGRADE CHOLANGIOPANCREATOGRAPHY (ERCP) WITH PROPOFOL N/A 03/11/2017   Procedure: ENDOSCOPIC RETROGRADE CHOLANGIOPANCREATOGRAPHY (ERCP) WITH PROPOFOL;  Surgeon: Lucilla Lame, MD;  Location: [facility] ENDOSCOPY;  Service: Endoscopy;  Laterality: N/A;  . ERCP N/A 03/19/2017   Procedure: ENDOSCOPIC RETROGRADE CHOLANGIOPANCREATOGRAPHY (ERCP);  Surgeon: Lucilla Lame, MD;  Location: [facility] ENDOSCOPY;  Service: Endoscopy;  Laterality: N/A;  . ERCP N/A 06/25/2017   Procedure: ENDOSCOPIC RETROGRADE CHOLANGIOPANCREATOGRAPHY (ERCP);  Surgeon: Lucilla Lame, MD;  Location: [facility] ENDOSCOPY;  Service: Endoscopy;  Laterality: N/A;  . ERCP N/A 07/16/2017   Procedure: ENDOSCOPIC RETROGRADE CHOLANGIOPANCREATOGRAPHY (ERCP);  Surgeon: Lucilla Lame, MD;  Location: [facility] ENDOSCOPY;  Service: Endoscopy;  Laterality: N/A;  . none     Family History:  Family History  Problem Relation Age of Onset  . Cancer Maternal Aunt   . Breast cancer Maternal Grandmother   . Lung cancer Maternal Grandfather   . Diabetes Mellitus II Mother    Family Psychiatric  History: Does not report  Social History:  Social History   Substance and Sexual Activity  Alcohol Use No  . Frequency: Never   Comment: Occasional wine about every 3-4 months     Social History   Substance and Sexual Activity  Drug Use No    Social History   Socioeconomic History  . Marital status: Divorced    Spouse name: Not on file  . Number of children: Not on file  . Years of education: Not on file  . Highest education level: Not on file  Occupational History  . Not on file  Social Needs  . Financial resource strain: Not on file  . Food insecurity:    Worry: Not on file    Inability: Not on file  . Transportation needs:    Medical: Not on file    Non-medical: Not on file  Tobacco Use  . Smoking status: Former Smoker    Packs/day: 1.00    Years: 18.00    Pack years: 18.00    Types: E-cigarettes  . Smokeless tobacco: Never Used  Substance and Sexual Activity  . Alcohol use: No    Frequency: Never    Comment: Occasional wine about every 3-4 months  . Drug use: No  . Sexual activity: Yes    Partners: Female  Lifestyle  . Physical activity:    Days per week: Not on file    Minutes per session: Not on file  . Stress: Not on file  Relationships  . Social connections:    Talks on phone:  Not on file    Gets together: Not on file    Attends religious service: Not on file    Active member of club or organization: Not on file    Attends meetings of clubs or organizations: Not on file    Relationship status: Not on file  Other Topics Concern  . Not on file  Social History Narrative  . Not on file   Additional Social History: Reports currently living with wife and 93 year old stepdaughter  Endorses marijuana use recreationally, "my doctor said it is okay for me to smoke since I have cancer.  I can handle my highs."    Allergies:   Allergies  Allergen Reactions  . Lamotrigine Other (See Comments)    DILI 2018  Labs:  Results for orders placed or performed during the hospital encounter of 05/14/18 (from the past 48 hour(s))  Urine Drug Screen, Qualitative     Status: Abnormal   Collection Time: 05/14/18  1:15 PM  Result Value Ref Range   Tricyclic, Ur Screen NONE DETECTED NONE DETECTED   Amphetamines, Ur Screen NONE DETECTED NONE DETECTED   MDMA (Ecstasy)Ur Screen NONE DETECTED NONE DETECTED   Cocaine Metabolite,Ur [place] NONE DETECTED NONE DETECTED   Opiate, Ur Screen NONE DETECTED NONE DETECTED   Phencyclidine (PCP) Ur S NONE DETECTED NONE DETECTED   Cannabinoid 50 Ng, Ur [place] POSITIVE (A) NONE DETECTED   Barbiturates, Ur Screen NONE DETECTED NONE DETECTED   Benzodiazepine, Ur Scrn NONE DETECTED NONE DETECTED   Methadone Scn, Ur NONE DETECTED NONE DETECTED    Comment: (NOTE) Tricyclics + metabolites, urine    Cutoff 1000 ng/mL Amphetamines + metabolites, urine  Cutoff 1000 ng/mL MDMA (Ecstasy), urine              Cutoff 500 ng/mL Cocaine Metabolite, urine          Cutoff 300 ng/mL Opiate + metabolites, urine        Cutoff 300 ng/mL Phencyclidine (PCP), urine         Cutoff 25 ng/mL Cannabinoid, urine                 Cutoff 50 ng/mL Barbiturates + metabolites, urine  Cutoff 200 ng/mL Benzodiazepine, urine              Cutoff 200 ng/mL Methadone, urine                    Cutoff 300 ng/mL The urine drug screen provides only a preliminary, unconfirmed analytical test result and should not be used for non-medical purposes. Clinical consideration and professional judgment should be applied to any positive drug screen result due to possible interfering substances. A more specific alternate chemical method must be used in order to obtain a confirmed analytical result. Gas chromatography / mass spectrometry (GC/MS) is the preferred confirmat ory method. Performed at [facility], [address] [place], [place] [address]   Comprehensive metabolic panel     Status: Abnormal   Collection Time: 05/14/18  1:27 PM  Result Value Ref Range   Sodium 138 135 - 145 mmol/L   Potassium 3.6 3.5 - 5.1 mmol/L   Chloride 104 98 - 111 mmol/L   CO2 26 22 - 32 mmol/L   Glucose, Bld 174 (H) 70 - 99 mg/dL   BUN <5 (L) 6 - 20 mg/dL   Creatinine, Ser 0.68 0.61 - 1.24 mg/dL   Calcium 9.7 8.9 - 10.3 mg/dL   Total Protein 8.2 (H) 6.5 - 8.1 g/dL   Albumin 4.4 3.5 - 5.0 g/dL   AST 110 (H) 15 - 41 U/L   ALT 215 (H) 0 - 44 U/L   Alkaline Phosphatase 325 (H) 38 - 126 U/L   Total Bilirubin 0.6 0.3 - 1.2 mg/dL   GFR calc non Af Amer >60 >60 mL/min   GFR calc Af Amer >60 >60 mL/min   Anion gap 8 5 - 15    Comment: Performed at [facility], [address] [place], [place] [address]  Ethanol     Status: None   Collection Time: 05/14/18  1:27 PM  Result Value Ref Range   Alcohol, Ethyl (B) <10 <10 mg/dL    Comment: (NOTE) Lowest detectable limit for  serum alcohol is 10 mg/dL. For medical purposes only. Performed at [facility], [address] [place], [place] [address]   Salicylate level     Status: None   Collection Time: 05/14/18  1:27 PM  Result Value Ref Range   Salicylate Lvl <4.0 2.8 - 30.0 mg/dL    Comment: Performed at [facility], [address] [place], [place] [address]  Acetaminophen level     Status: Abnormal    Collection Time: 05/14/18  1:27 PM  Result Value Ref Range   Acetaminophen (Tylenol), Serum <10 (L) 10 - 30 ug/mL    Comment: (NOTE) Therapeutic concentrations vary significantly. A range of 10-30 ug/mL  may be an effective concentration for many patients. However, some  are best treated at concentrations outside of this range. Acetaminophen concentrations >150 ug/mL at 4 hours after ingestion  and >50 ug/mL at 12 hours after ingestion are often associated with  toxic reactions. Performed at [facility], [address] [place], [place] [address]   cbc     Status: Abnormal   Collection Time: 05/14/18  1:27 PM  Result Value Ref Range   WBC 7.0 4.0 - 10.5 K/uL   RBC 3.48 (L) 4.22 - 5.81 MIL/uL   Hemoglobin 11.0 (L) 13.0 - 17.0 g/dL   HCT 34.1 (L) 39.0 - 52.0 %   MCV 98.0 80.0 - 100.0 fL   MCH 31.6 26.0 - 34.0 pg   MCHC 32.3 30.0 - 36.0 g/dL   RDW 13.8 11.5 - 15.5 %   Platelets 147 (L) 150 - 400 K/uL    Comment: Immature Platelet Fraction may be clinically indicated, consider ordering this additional test [id number]    nRBC 0.0 0.0 - 0.2 %    Comment: Performed at [facility], [address], [place], [place] [address]   Labs reviewed.  No current facility-administered medications for this encounter.    Current Outpatient Medications  Medication Sig Dispense Refill  . acetaminophen (TYLENOL) 325 MG tablet Take 2 tablets (650 mg total) by mouth every 6 (six) hours as needed for mild pain (or Fever >/= 101).    . bisacodyl (DULCOLAX) 5 MG EC tablet Take 1 tablet (5 mg total) by mouth daily as needed for moderate constipation. 30 tablet 0  . HYDROcodone-acetaminophen (NORCO/VICODIN) 5-325 MG tablet Take 1 tablet by mouth every 4 (four) hours as needed for moderate pain. 30 tablet 0  . loratadine (CLARITIN) 10 MG tablet Take 1 tablet (10 mg total) by mouth daily. 30 tablet 0  . nicotine (NICODERM CQ - DOSED IN MG/24 HOURS) 14 mg/24hr patch Place 1 patch (14  mg total) onto the skin daily. 28 patch 0  . OLANZapine (ZYPREXA) 10 MG tablet Take 10 mg by mouth at bedtime.     Mar[place] Kitchen oxyCODONE (OXYCONTIN) 10 mg 12 hr tablet Take 1 tablet (10 mg total) by mouth every 12 (twelve) hours. 20 tablet 0  . protein supplement shake (PREMIER PROTEIN) LIQD Take 325 mLs (11 oz total) by mouth 2 (two) times daily between meals. 60 Can 0  . senna (SENOKOT) 8.6 MG TABS tablet Take 1 tablet (8.6 mg total) by mouth at bedtime. 120 each 0    Musculoskeletal: Strength & Muscle Tone: within normal limits Gait & Station: normal Patient leans: N/A  Psychiatric Specialty Exam: Physical Exam  Nursing note and vitals reviewed. Constitutional: He is oriented to person, place, and time. He appears well-developed and well-nourished. No distress.  HENT:  Head:  Normocephalic and atraumatic.  Eyes: EOM are normal.  Cardiovascular: Normal rate.  Respiratory: Effort normal.  Musculoskeletal: Normal range of motion.  Neurological: He is alert and oriented to person, place, and time.    Review of Systems  Constitutional: Negative.   Respiratory: Negative.   Cardiovascular: Negative.   Gastrointestinal: Positive for abdominal pain, blood in stool, constipation and nausea.  Musculoskeletal:       Generalized pain  Neurological: Negative.   Psychiatric/Behavioral: Positive for substance abuse (marijauna use yesterday). Negative for depression, hallucinations, memory loss and suicidal ideas. The patient is not nervous/anxious and does not have insomnia.     Blood pressure (!) 128/94, pulse 84, temperature 97.9 F (36.6 C), temperature source Oral, resp. rate 16, height 5\' 8"  (1.727 m), weight 65.8 kg, SpO2 99 %.Body mass index is 22.05 kg/m.  General Appearance: Neat  Eye Contact:  Good  Speech:  Clear and Coherent and Normal Rate  Volume:  Normal  Mood:  Irritable  Affect:  Appropriate  Thought Process:  Goal Directed and Descriptions of Associations: Loose  Orientation:   Full (Time, Place, and Person)  Thought Content:  Delusions, Hallucinations: Denies, Paranoid Ideation and Rumination  Suicidal Thoughts:  Denies  Homicidal Thoughts:  Denies  Memory:  Fair  Judgement:  Impaired  Insight:  Fair and Shallow  Psychomotor Activity:  Restlessness  Concentration:  Concentration: Good  Recall:  [place] of Knowledge:  Good  Language:  Good  Akathisia:  No  Handed:  Right  AIMS (if indicated):   0  Assets:  Agricultural consultant Housing  ADL's:  Intact  Cognition:  WNL  Sleep:   Decreased     Treatment Plan Summary: Daily contact with patient to assess and evaluate symptoms and progress in treatment, Medication management and Plan Restart home meds to include Zyprexa 15 mg nightly with Ativan 0.5 mg nightly  Disposition: Recommend psychiatric Inpatient admission when medically cleared. Supportive therapy provided about ongoing stressors.  Lavella Hammock, MD 05/14/2018 4:25 PM

## 2018-05-14 NOTE — ED Notes (Signed)
Hourly rounding reveals patient in room. No complaints, stable, in no acute distress. Q15 minute rounds and monitoring via Rover and Officer to continue.   

## 2018-05-14 NOTE — ED Notes (Signed)
BEHAVIORAL HEALTH ROUNDING Patient sleeping: Yes.   Patient alert and oriented: eyes closed  Appears asleep Behavior appropriate: Yes.  ; If no, describe:  Nutrition and fluids offered: Yes  Toileting and hygiene offered: sleeping Sitter present: q 15 minute observations and security monitoring Law enforcement present: yes  ODS 

## 2018-05-14 NOTE — ED Notes (Signed)
BEHAVIORAL HEALTH ROUNDING Patient sleeping: No. Patient alert and oriented: yes Behavior appropriate: Yes.  ; If no, describe:  Nutrition and fluids offered: yes Toileting and hygiene offered: Yes  Sitter present: q15 minute observations and security  monitoring Law enforcement present: Yes  ODS  

## 2018-05-15 ENCOUNTER — Inpatient Hospital Stay: Admission: RE | Admit: 2018-05-15 | Payer: Medicaid Other | Source: Intra-hospital | Admitting: Psychiatry

## 2018-05-15 LAB — GLUCOSE, CAPILLARY
GLUCOSE-CAPILLARY: 80 mg/dL (ref 70–99)
Glucose-Capillary: 225 mg/dL — ABNORMAL HIGH (ref 70–99)
Glucose-Capillary: 25 mg/dL — CL (ref 70–99)
Glucose-Capillary: 166 mg/dL — ABNORMAL HIGH (ref 70–99)
Glucose-Capillary: 72 mg/dL (ref 70–99)

## 2018-05-15 MED ORDER — INSULIN ASPART 100 UNIT/ML ~~LOC~~ SOLN
8.0000 [IU] | Freq: Three times a day (TID) | SUBCUTANEOUS | Status: DC
  Administered 2018-05-16 – 2018-05-18 (×5): 8 [IU] via SUBCUTANEOUS
  Filled 2018-05-15 (×6): qty 1

## 2018-05-15 MED ORDER — INSULIN REGULAR HUMAN 100 UNIT/ML IJ SOLN: 8.0000 [IU] | Freq: Three times a day (TID) | INTRAMUSCULAR | Status: DC

## 2018-05-15 NOTE — ED Notes (Signed)
Patient refuses his premier protein shakes he says they make him nauseated, Probation officer ordered pudding, jello, and applesauce for patient in the refrigerator with his name on it. Patient ate the chocolate pudding

## 2018-05-15 NOTE — ED Notes (Signed)
Spoke with patients wife Alain Deschene [phone]) and she explained that patient has an upcoming court date and wanted to know if we were able to fax over paper with our letter heading, stating that patient was in [facility] and have it faxed to [facility] [phone]. Informed wife that when patient is admitted inpatient we will have his Social worker contact her

## 2018-05-15 NOTE — ED Notes (Signed)
TTS in room talking to patient.

## 2018-05-15 NOTE — ED Notes (Signed)
TTS called and informed me that she was told by [facility] charge nurse, that the patient can not come down (be admitted) right now until they have all of their discharges complete.

## 2018-05-15 NOTE — BH Assessment (Signed)
Assessment Note  Travis Hawkins is an 43 y.o. male. Who reports to the ER who has been involuntarily committed by his significant other. Pt with a history of bipolar 1 disorder and aggression with mania. Pt reports medication compliance is still been getting the medication from Dr. Deanna Artis, His previous doctor who is located in [place]. 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 Pt states that his wife harasses him calling him stupid and crazy, states it is his wife's fault that he sat in prison for 3 months. Pt states he had a restraining order against his wife but due to being in jail was unable to appear in court and the order got dropped. Pt seems to be preoccupied with his wife and her mental health.  He states he "has a Therapist, music, worked as a Librarian, academic at [employer], held multiple other jobs, and most recently has a Doctor, general practice. Pt. denies any suicidal ideation, plan or intent. Pt. denies the presence of any auditory or visual hallucinations at this time. Patient denies any other medical complaints.Pt denied any history of suicide attempts. He reports 2 previous [facility] admissions although he attributes his most recent admission to to his wife committing him unwillingly and unnecessarily.   Diagnosis: Bipolar Disorder   Past Medical History:  Past Medical History:  Diagnosis Date  . Biliary obstruction   . Depression   . Mood disorder [facility])     Past Surgical History:  Procedure Laterality Date  . CHOLECYSTECTOMY    . ENDOSCOPIC RETROGRADE CHOLANGIOPANCREATOGRAPHY (ERCP) WITH PROPOFOL N/A 03/11/2017   Procedure: ENDOSCOPIC RETROGRADE CHOLANGIOPANCREATOGRAPHY (ERCP) WITH PROPOFOL;  Surgeon: Lucilla Lame, MD;  Location: [facility] ENDOSCOPY;  Service: Endoscopy;  Laterality: N/A;  . ERCP N/A 03/19/2017   Procedure: ENDOSCOPIC RETROGRADE CHOLANGIOPANCREATOGRAPHY (ERCP);   Surgeon: Lucilla Lame, MD;  Location: [facility] ENDOSCOPY;  Service: Endoscopy;  Laterality: N/A;  . ERCP N/A 06/25/2017   Procedure: ENDOSCOPIC RETROGRADE CHOLANGIOPANCREATOGRAPHY (ERCP);  Surgeon: Lucilla Lame, MD;  Location: [facility] ENDOSCOPY;  Service: Endoscopy;  Laterality: N/A;  . ERCP N/A 07/16/2017   Procedure: ENDOSCOPIC RETROGRADE CHOLANGIOPANCREATOGRAPHY (ERCP);  Surgeon: Lucilla Lame, MD;  Location: [facility] ENDOSCOPY;  Service: Endoscopy;  Laterality: N/A;  . none      Family History:  Family History  Problem Relation Age of Onset  . Cancer Maternal Aunt   . Breast cancer Maternal Grandmother   . Lung cancer Maternal Grandfather   . Diabetes Mellitus II Mother     Social History:  reports that he has quit smoking. His smoking use included e-cigarettes. He has a 18.00 pack-year smoking history. He has never used smokeless tobacco. He reports that he does not drink alcohol or use drugs.  Additional Social History:  Alcohol / Drug Use Pain Medications: SEE MAR  Prescriptions: SEE MAR  Over the Counter: SEE MAR  History of alcohol / drug use?: (S) No history of alcohol / drug abuse(Per pt, although BAL is elevated )  CIWA: CIWA-Ar BP: (!) 128/94 Pulse Rate: 84 COWS:    Allergies:  Allergies  Allergen Reactions  . Lamotrigine Other (See Comments)    DILI 2018    Home Medications: (Not in a hospital admission)   OB/GYN Status:  No LMP for male patient.  General Assessment Data Location of Assessment: [facility] ED TTS Assessment: In system Is this a Tele or Face-to-Face Assessment?: Face-to-Face Is this an Initial  Assessment or a Re-assessment for this encounter?: Initial Assessment Patient Accompanied by:: N/A Living Arrangements: Other (Comment) What gender do you identify as?: Male Marital status: Divorced Living Arrangements: Spouse/significant other Can pt return to current living arrangement?: Yes Admission Status: Involuntary Petitioner: Family member Is patient  capable of signing voluntary admission?: No Referral Source: Self/Family/Friend Insurance type: medicaid   Medical Screening Exam ([place]) Medical Exam completed: Yes  Crisis Care Plan Living Arrangements: Spouse/significant other Legal Guardian: Other:(none) Name of Psychiatrist: none  Name of Therapist: none  Education Status Is patient currently in school?: No Is the patient employed, unemployed or receiving disability?: Unemployed  Risk to self with the past 6 months Suicidal Ideation: No Has patient been a risk to self within the past 6 months prior to admission? : No Suicidal Intent: No Has patient had any suicidal intent within the past 6 months prior to admission? : No Is patient at risk for suicide?: No, but patient needs Medical Clearance Suicidal Plan?: No Has patient had any suicidal plan within the past 6 months prior to admission? : No Access to Means: No What has been your use of drugs/alcohol within the last 12 months?: BAL 241 UDS (+)Cannabinoid(Pt denied ) Previous Attempts/Gestures: No How many times?: 0 Triggers for Past Attempts: None known Intentional Self Injurious Behavior: None Family Suicide History: No Recent stressful life event(s): Conflict (Comment) Persecutory voices/beliefs?: No Depression: No Depression Symptoms: (Pt denied ) Substance abuse history and/or treatment for substance abuse?: Yes Suicide prevention information given to non-admitted patients: Not applicable  Risk to Others within the past 6 months Homicidal Ideation: No Does patient have any lifetime risk of violence toward others beyond the six months prior to admission? : No Thoughts of Harm to Others: No Current Homicidal Intent: No Current Homicidal Plan: No Access to Homicidal Means: No Identified Victim: none  History of harm to others?: No Assessment of Violence: None Noted Violent Behavior Description: none  Does patient have access to weapons?:  No Criminal Charges Pending?: No Does patient have a court date: No Is patient on probation?: No  Psychosis Hallucinations: None noted Delusions: None noted  Mental Status Report Appearance/Hygiene: In scrubs Eye Contact: Poor Motor Activity: Freedom of movement Speech: Pressured, Logical/coherent Level of Consciousness: Alert Mood: Irritable Affect: Irritable Anxiety Level: Minimal Thought Processes: Coherent Judgement: Partial Orientation: Time, Situation, Place, Person Obsessive Compulsive Thoughts/Behaviors: None  Cognitive Functioning Concentration: Fair Memory: Remote Intact, Recent Intact Is patient IDD: No Insight: Poor Impulse Control: Poor Appetite: Fair Have you had any weight changes? : No Change Sleep: No Change Total Hours of Sleep: 5 Vegetative Symptoms: None  ADLScreening [facility] Services) Patient's cognitive ability adequate to safely complete daily activities?: Yes Patient able to express need for assistance with ADLs?: Yes Independently performs ADLs?: Yes (appropriate for developmental age)  Prior Inpatient Therapy Prior Inpatient Therapy: Yes Prior Therapy Dates: Unknown  Prior Therapy Facilty/Provider(s): [facility]  Reason for Treatment: Bipolar Disorder   Prior Outpatient Therapy Prior Outpatient Therapy: No Does patient have an ACCT team?: No Does patient have Intensive In-House Services?  : No Does patient have [facility] services? : No Does patient have P4CC services?: No  ADL Screening (condition at time of admission) Patient's cognitive ability adequate to safely complete daily activities?: Yes Patient able to express need for assistance with ADLs?: Yes Independently performs ADLs?: Yes (appropriate for developmental age)       Abuse/Neglect Assessment (Assessment to be complete while patient is alone) Abuse/Neglect  Assessment Can Be Completed: Yes Physical Abuse: Denies Verbal Abuse: Denies Sexual Abuse:  Denies Exploitation of patient/patient's resources: Denies Self-Neglect: Denies Values / Beliefs Cultural Requests During Hospitalization: None Spiritual Requests During Hospitalization: None Consults Spiritual Care Consult Needed: No Social Work Consult Needed: No Regulatory affairs officer (For Healthcare) Does Patient Have a Medical Advance Directive?: No Would patient like information on creating a medical advance directive?: No - Patient declined          Disposition:  Disposition Initial Assessment Completed for this Encounter: Yes  On Site Evaluation by:   Reviewed with Physician:    Laretta Alstrom 05/15/2018 6:53 AM

## 2018-05-15 NOTE — ED Notes (Signed)
Pt being reviewed for possible admission to [facility]. H&P and Assessment has been provided to the [facility] for the charge nurse to review and provide bed assignment.

## 2018-05-15 NOTE — BH Assessment (Signed)
Per Dr. Leverne Humbles (after consulting with [facility] Assistant Director), due to pt's medical concerns - he would not be an appropriate admission to [facility]. He would be better suited for a facility where he can be treated for his pancreatic cancer diagnosis and continue his chemo medications.  Per Dr. Leverne Humbles, pt is being recommended to be referred to [facility] for further treatment of his mental health and medical concerns.  TTS will continue to seek placement for pt.

## 2018-05-15 NOTE — ED Notes (Signed)
Pt. Woke while this nurse was doing 15 minute checks.  Pt. Requested and was given a drink.

## 2018-05-15 NOTE — Progress Notes (Signed)
Chaplain paged to meet with patient this morning. Patient was alert, oriented, provided good eye contact, and was able to communicate clearly. His speech was emotive and rapid at times, particularly around topics concerning his wife. Patient's nurse indicated that the patient might benefit from talking with a chaplain due to some recent changes in health, relationships, and personal circumstances. The patient provided a history of recent challenges, with particular attention and energy given to frustration and anger with/towards his wife, whom he credits as "the reason why he has been involuntarily committed." He cites her as the primary "trigger" for his recent troubles and desires to end the marriage. 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 Sacred texts (New Testament scripture) and Daily Bread Devotional provided.   Patient became upset again at the end of the visit upon being informed by his nurse that he would be admitted as an inpatient. He stated that he has pancreatic cancer and should have been/be treated with chemotherapy accordingly. Patient states that he is frustrated that he has not received most of his medications while being here so far.

## 2018-05-15 NOTE — ED Notes (Signed)
Hourly rounding reveals patient in room. No complaints, stable, in no acute distress. Q15 minute rounds and monitoring via Security Cameras to continue. 

## 2018-05-15 NOTE — ED Notes (Signed)
Patient talking with chaplain Seth Bake

## 2018-05-15 NOTE — ED Notes (Signed)
Hourly rounding reveals patient in day room. No complaints, stable, in no acute distress. Q15 minute rounds and monitoring via Security Cameras to continue. 

## 2018-05-15 NOTE — ED Notes (Signed)
BEHAVIORAL HEALTH ROUNDING Patient sleeping: No. Patient alert and oriented: yes Behavior appropriate: Yes.  ; If no, describe:  Nutrition and fluids offered: yes Toileting and hygiene offered: Yes  Sitter present: q15 minute observations and security monitoring Law enforcement present: Yes    

## 2018-05-15 NOTE — ED Notes (Signed)
Patients wife called and informed Probation officer that patient has been calling her and her job and her friend. Patients wife feels he is calling harassing her and she asked [place] to please not have him call her anymore. Dr. Leverne Humbles and myself went and talked with patient and explained to him what he wife said and he said he would not call her anymore, he just doesn't understand how she can have him involuntarily committed.

## 2018-05-15 NOTE — ED Notes (Addendum)
Writer checked patients blood sugar at lunch about 1211 and it was 25 mg/dl, patient was given his lunch tray, graham crackers and peanut butter, and a coca-cola and sprite. Patient ambulatory and verbal NAD noted, patients hands are moist, patient is alert and oriented. Dr. Jimmye Norman also informed will recheck blood sugar in 15 minutes.

## 2018-05-16 DIAGNOSIS — F3113 Bipolar disorder, current episode manic without psychotic features, severe: Secondary | ICD-10-CM

## 2018-05-16 DIAGNOSIS — E119 Type 2 diabetes mellitus without complications: Secondary | ICD-10-CM

## 2018-05-16 DIAGNOSIS — K861 Other chronic pancreatitis: Secondary | ICD-10-CM

## 2018-05-16 LAB — GLUCOSE, CAPILLARY
Glucose-Capillary: 150 mg/dL — ABNORMAL HIGH (ref 70–99)
Glucose-Capillary: 150 mg/dL — ABNORMAL HIGH (ref 70–99)
Glucose-Capillary: 77 mg/dL (ref 70–99)
Glucose-Capillary: 209 mg/dL — ABNORMAL HIGH (ref 70–99)
Glucose-Capillary: 185 mg/dL — ABNORMAL HIGH (ref 70–99)

## 2018-05-16 MED ORDER — HYDROMORPHONE HCL 2 MG PO TABS
4.0000 mg | ORAL_TABLET | ORAL | Status: DC | PRN
  Administered 2018-05-16 – 2018-05-18 (×5): 4 mg via ORAL
  Filled 2018-05-16 (×6): qty 2

## 2018-05-16 MED ORDER — PANCRELIPASE (LIP-PROT-AMYL) 12000-38000 UNITS PO CPEP
36000.0000 [IU] | ORAL_CAPSULE | Freq: Three times a day (TID) | ORAL | Status: DC
  Administered 2018-05-16 – 2018-05-19 (×10): 36000 [IU] via ORAL
  Filled 2018-05-16 (×13): qty 3

## 2018-05-16 MED ORDER — LITHIUM CARBONATE ER 300 MG PO TBCR
600.0000 mg | EXTENDED_RELEASE_TABLET | Freq: Every day | ORAL | Status: DC
  Administered 2018-05-16 – 2018-05-17 (×2): 600 mg via ORAL
  Filled 2018-05-16 (×2): qty 2

## 2018-05-16 NOTE — ED Notes (Signed)
Hourly rounding reveals patient in room. No complaints, stable, in no acute distress. Q15 minute rounds and monitoring via Security Cameras to continue. 

## 2018-05-16 NOTE — ED Notes (Signed)
Report to include Situation, Background, Assessment, and Recommendations received from [facility]. Patient alert and oriented, warm and dry, in no acute distress. Patient denies SI, HI, AVH and pain. Patient made aware of Q15 minute rounds and security cameras for their safety. Patient instructed to come to me with needs or concerns.

## 2018-05-16 NOTE — ED Notes (Signed)
Patient is talking on the phone to His cousin, He is alert and oriented, calm and cooperative.

## 2018-05-16 NOTE — ED Notes (Signed)
Nurse gave Patient creon medication with His breakfast snack tray and beverage, patient is pleasant, oriented, no signs of distress, nurse will continue to monitor, will obtain blood sugar per tech and coverage as ordered. Patient has tv on in room, no behavioral issues.

## 2018-05-16 NOTE — ED Provider Notes (Signed)
-----------------------------------------   2:02 AM on 05/16/2018 -----------------------------------------   Blood pressure 129/81, pulse 94, temperature 98.4 F (36.9 C), temperature source Oral, resp. rate 16, height 5\' 8"  (1.727 m), weight 65.8 kg, SpO2 100 %.  The patient is calm and cooperative at this time.  The patient reports he has a history of pancreatic cancer and is requesting Dilaudid for his pain.  I did review the patient's most recent oncology visit from 10 days ago and he is prescribed Dilaudid 4 mg by mouth every 4 hours as needed for pain so I will place that order in now.  He is apparently getting transferred in the morning.   Darel Hong, MD 05/16/18 [id number]

## 2018-05-16 NOTE — ED Notes (Signed)
Mable Fill is talking with Patient at this time.

## 2018-05-16 NOTE — BH Assessment (Signed)
This Probation officer called [facility] and spoke with Jenny Reichmann (Intake Staff) regarding available psych beds.   Pt's information was faxed to [facility] for psychiatric inpatient admission.  To follow-up on referral please call [phone].  Pt's information was faxed to [phone].

## 2018-05-16 NOTE — ED Notes (Signed)
Patient took shower.

## 2018-05-16 NOTE — ED Notes (Signed)
Patient complaining of stomach pain, nurse administered dilaudid po.

## 2018-05-16 NOTE — ED Notes (Signed)
Nurse talked with patient , he denies Si/hi or avh, patient is calm and cooperative, is fixated on wife, states that she had him put in here because she controlling and she is the one that is delusional, patient is hyper talkative, but he is redirectable, and does listen and respond appropriately. Nurse will continue to monitor, q 15 minute checks and camera surveillance in progress for safety.

## 2018-05-16 NOTE — Progress Notes (Signed)
   05/16/18 1500  Clinical Encounter Type  Visited With Patient  Visit Type Follow-up  Referral From Nurse  Spiritual Encounters  Spiritual Needs Literature  Pt requested Our Daily Bread. Ch provided a copy.

## 2018-05-16 NOTE — ED Notes (Signed)
Snack and beverage given. 

## 2018-05-16 NOTE — Consult Note (Signed)
[facility] Face-to-Face Psychiatry Consult   Reason for Consult: Consult for this 43 year old man with a history of bipolar disorder also with multiple medical problems.  Patient has presented as manic Referring Physician: Corky Downs Patient Identification: KAYDENCE BABA MRN:  [id number] Principal Diagnosis: Bipolar affective disorder, manic, severe ([place]) Diagnosis:  Principal Problem:   Bipolar affective disorder, manic, severe ([place]) Active Problems:   Malignant tumor of head of pancreas ([place])   Diabetes mellitus ([place])   Chronic pancreatitis ([place])   Total Time spent with patient: 1 hour  Subjective:   DAVEN MONTZ is a 43 y.o. male patient admitted with "this is all because of my wife".  HPI: Patient seen chart reviewed.  Also spoke with the patient's cousin with his permission.  Also spoke with his wife.  Reviewed multiple old notes.  This is a 43 year old man was brought in under IVC the day before yesterday with commitment papers stating that he was threatening to assault his wife that he was agitated disorganized and appeared to be showing signs of mania with dangerous aggressive behavior.  Patient has been in the emergency room since then because of concerns about the acuity of his medical issues.  On reevaluation today found the patient to still be hyperverbal and tangential disorganized and although not threatening to harm anyone still paranoid and delusional and blaming his wife for all of his problems.  Patient has been compliant with prescribed Zyprexa since being in the emergency room.  Patient has partial insight understanding that he is still having some symptoms of mania although he is downplaying them.  I spoke with the wife and cousin both of whom told the same consistent story that the patient decompensated badly when he received his diagnosis of cancer at the beginning of this year.  Since then he has been exhibiting signs of mania with disorganized thinking and agitation and poor  self-care.  Both of them report that his current mental status is nothing like his normal baseline.  In the last year the patient has had at least 1 psychiatric hospitalization in September for mania although after that episode he apparently continued to display manic behavior and got himself in prison.  He has been out of prison for just about 3 weeks now and during that time he has been agitated and hostile to his wife causing an unsafe environment.  Social history: Patient is married although his wife is now afraid of him and uncomfortable living with him until he is much better mentally.  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  Medical history: Patient has stage III pancreatic cancer with extension into his liver.  He is being treated for this with chemotherapy primarily at [facility] in their cancer clinic.  Additionally he has diabetes with some difficulty in controlling it and some chronic pancreatitis symptoms requiring pancreatic enzymes.  Substance abuse history: Denies alcohol or drug abuse recently although does admit that he is intermittently use marijuana over the last several weeks.  Does not appear to have ever felt it was a treatable problem  Past Psychiatric History: Everyone agrees that the patient first showed signs of bipolar disorder at approximately age 25.  He had a clear-cut manic episode and was hospitalized at that time.  After that however it appears that he was almost completely stable for about 20 years until this recent episode of decompensation.  No history of suicide attempts.  For years it appears that Zyprexa was a  maintenance medication.  Patient remembers however there have been times in the past when he was acutely treated with lithium.  There was an effort to get him onto lamotrigine earlier this year but he would not comply with it.  Risk to Self: Suicidal Ideation: No Suicidal Intent: No Is patient at risk for suicide?: No, but patient  needs Medical Clearance Suicidal Plan?: No Access to Means: No What has been your use of drugs/alcohol within the last 12 months?: BAL 241 UDS (+)Cannabinoid(Pt denied ) How many times?: 0 Triggers for Past Attempts: None known Intentional Self Injurious Behavior: None Risk to Others: Homicidal Ideation: No Thoughts of Harm to Others: No Current Homicidal Intent: No Current Homicidal Plan: No Access to Homicidal Means: No Identified Victim: none  History of harm to others?: No Assessment of Violence: None Noted Violent Behavior Description: none  Does patient have access to weapons?: No Criminal Charges Pending?: No Does patient have a court date: No Prior Inpatient Therapy: Prior Inpatient Therapy: Yes Prior Therapy Dates: Unknown  Prior Therapy Facilty/Provider(s): [facility]  Reason for Treatment: Bipolar Disorder  Prior Outpatient Therapy: Prior Outpatient Therapy: No Does patient have an ACCT team?: No Does patient have Intensive In-House Services?  : No Does patient have [facility] services? : No Does patient have P4CC services?: No  Past Medical History:  Past Medical History:  Diagnosis Date  . Biliary obstruction   . Depression   . Mood disorder [facility])     Past Surgical History:  Procedure Laterality Date  . CHOLECYSTECTOMY    . ENDOSCOPIC RETROGRADE CHOLANGIOPANCREATOGRAPHY (ERCP) WITH PROPOFOL N/A 03/11/2017   Procedure: ENDOSCOPIC RETROGRADE CHOLANGIOPANCREATOGRAPHY (ERCP) WITH PROPOFOL;  Surgeon: Lucilla Lame, MD;  Location: [facility] ENDOSCOPY;  Service: Endoscopy;  Laterality: N/A;  . ERCP N/A 03/19/2017   Procedure: ENDOSCOPIC RETROGRADE CHOLANGIOPANCREATOGRAPHY (ERCP);  Surgeon: Lucilla Lame, MD;  Location: [facility] ENDOSCOPY;  Service: Endoscopy;  Laterality: N/A;  . ERCP N/A 06/25/2017   Procedure: ENDOSCOPIC RETROGRADE CHOLANGIOPANCREATOGRAPHY (ERCP);  Surgeon: Lucilla Lame, MD;  Location: [facility] ENDOSCOPY;  Service: Endoscopy;  Laterality: N/A;  . ERCP N/A 07/16/2017    Procedure: ENDOSCOPIC RETROGRADE CHOLANGIOPANCREATOGRAPHY (ERCP);  Surgeon: Lucilla Lame, MD;  Location: [facility] ENDOSCOPY;  Service: Endoscopy;  Laterality: N/A;  . none     Family History:  Family History  Problem Relation Age of Onset  . Cancer Maternal Aunt   . Breast cancer Maternal Grandmother   . Lung cancer Maternal Grandfather   . Diabetes Mellitus II Mother    Family Psychiatric  History: None known Social History:  Social History   Substance and Sexual Activity  Alcohol Use No  . Frequency: Never   Comment: Occasional wine about every 3-4 months     Social History   Substance and Sexual Activity  Drug Use No    Social History   Socioeconomic History  . Marital status: Divorced    Spouse name: Not on file  . Number of children: Not on file  . Years of education: Not on file  . Highest education level: Not on file  Occupational History  . Not on file  Social Needs  . Financial resource strain: Not on file  . Food insecurity:    Worry: Not on file    Inability: Not on file  . Transportation needs:    Medical: Not on file    Non-medical: Not on file  Tobacco Use  . Smoking status: Former Smoker    Packs/day: 1.00    Years: 18.00  Pack years: 18.00    Types: E-cigarettes  . Smokeless tobacco: Never Used  Substance and Sexual Activity  . Alcohol use: No    Frequency: Never    Comment: Occasional wine about every 3-4 months  . Drug use: No  . Sexual activity: Yes    Partners: Female  Lifestyle  . Physical activity:    Days per week: Not on file    Minutes per session: Not on file  . Stress: Not on file  Relationships  . Social connections:    Talks on phone: Not on file    Gets together: Not on file    Attends religious service: Not on file    Active member of club or organization: Not on file    Attends meetings of clubs or organizations: Not on file    Relationship status: Not on file  Other Topics Concern  . Not on file  Social History  Narrative  . Not on file   Additional Social History:    Allergies:   Allergies  Allergen Reactions  . Lamotrigine Other (See Comments)    DILI 2018    Labs:  Results for orders placed or performed during the hospital encounter of 05/14/18 (from the past 48 hour(s))  Glucose, capillary     Status: Abnormal   Collection Time: 05/14/18  5:54 PM  Result Value Ref Range   Glucose-Capillary 241 (H) 70 - 99 mg/dL  Glucose, capillary     Status: Abnormal   Collection Time: 05/15/18  7:24 AM  Result Value Ref Range   Glucose-Capillary 225 (H) 70 - 99 mg/dL  Glucose, capillary     Status: Abnormal   Collection Time: 05/15/18 12:07 PM  Result Value Ref Range   Glucose-Capillary 25 (LL) 70 - 99 mg/dL  Glucose, capillary     Status: None   Collection Time: 05/15/18 12:36 PM  Result Value Ref Range   Glucose-Capillary 80 70 - 99 mg/dL  Glucose, capillary     Status: Abnormal   Collection Time: 05/15/18  5:26 PM  Result Value Ref Range   Glucose-Capillary 166 (H) 70 - 99 mg/dL  Glucose, capillary     Status: None   Collection Time: 05/15/18  7:05 PM  Result Value Ref Range   Glucose-Capillary 72 70 - 99 mg/dL   Comment 1 Notify RN   Glucose, capillary     Status: Abnormal   Collection Time: 05/16/18  4:59 AM  Result Value Ref Range   Glucose-Capillary 150 (H) 70 - 99 mg/dL  Glucose, capillary     Status: Abnormal   Collection Time: 05/16/18  7:40 AM  Result Value Ref Range   Glucose-Capillary 150 (H) 70 - 99 mg/dL  Glucose, capillary     Status: None   Collection Time: 05/16/18 12:12 PM  Result Value Ref Range   Glucose-Capillary 77 70 - 99 mg/dL    Current Facility-Administered Medications  Medication Dose Route Frequency Provider Last Rate Last Dose  . acetaminophen (TYLENOL) tablet 650 mg  650 mg Oral Q6H PRN Lavella Hammock, MD   650 mg at 05/14/18 1755  . amoxicillin (AMOXIL) capsule 1,000 mg  1,000 mg Oral Q12H Nance Pear, MD   1,000 mg at 05/16/18 0814  .  bisacodyl (DULCOLAX) EC tablet 5 mg  5 mg Oral Daily PRN Lavella Hammock, MD      . clarithromycin Binnie Rail) tablet 500 mg  500 mg Oral Q12H Nance Pear, MD   500 mg at  05/16/18 0815  . HYDROcodone-acetaminophen (NORCO/VICODIN) 5-325 MG per tablet 1 tablet  1 tablet Oral Q4H PRN Lavella Hammock, MD      . HYDROmorphone (DILAUDID) tablet 4 mg  4 mg Oral Q4H PRN Darel Hong, MD   4 mg at 05/16/18 0235  . insulin aspart (novoLOG) injection 0-9 Units  0-9 Units Subcutaneous TID WC Lavella Hammock, MD   1 Units at 05/16/18 [phone]  . insulin aspart (novoLOG) injection 8 Units  8 Units Subcutaneous TID WC Lavella Hammock, MD   8 Units at 05/16/18 0815  . lipase/protease/amylase (CREON) capsule 24,000 Units  24,000 Units Oral TID AC Lavella Hammock, MD   24,000 Units at 05/16/18 1225  . loratadine (CLARITIN) tablet 10 mg  10 mg Oral Daily Lavella Hammock, MD   10 mg at 05/16/18 0818  . LORazepam (ATIVAN) tablet 0.5 mg  0.5 mg Oral QHS PRN Lavella Hammock, MD      . nicotine (NICODERM CQ - dosed in mg/24 hours) patch 14 mg  14 mg Transdermal Daily Lavella Hammock, MD   14 mg at 05/16/18 0940  . OLANZapine (ZYPREXA) tablet 15 mg  15 mg Oral QHS Lavella Hammock, MD   15 mg at 05/15/18 2301  . protein supplement (PREMIER PROTEIN) liquid - approved for s/p bariatric surgery  11 oz Oral BID BM Lavella Hammock, MD   11 oz at 05/16/18 0940  . senna (SENOKOT) tablet 8.6 mg  1 tablet Oral QHS Lavella Hammock, MD   8.6 mg at 05/15/18 2115   Current Outpatient Medications  Medication Sig Dispense Refill  . amoxicillin (AMOXIL) 500 MG tablet Take 1,000 mg by mouth every 12 (twelve) hours.    . clarithromycin (BIAXIN) 500 MG tablet Take 500 mg by mouth every 12 (twelve) hours.    . colestipol (COLESTID) 1 g tablet Take 1 g by mouth every 6 (six) hours.    . ferrous sulfate 324 (65 Fe) MG TBEC Take 324 mg by mouth daily with breakfast.    . gabapentin (NEURONTIN) 100 MG capsule Take 100 mg by mouth 2  (two) times daily.    . insulin lispro (HUMALOG KWIKPEN) 100 UNIT/ML KwikPen Inject 8 Units into the skin 3 (three) times daily with meals.    Marland Kitchen LORazepam (ATIVAN) 0.5 MG tablet Take 0.5 mg by mouth every 8 (eight) hours as needed for anxiety.    . Melatonin 3 MG TABS Take 3 mg by mouth Nightly.    . pantoprazole (PROTONIX) 40 MG tablet Take 40 mg by mouth every 12 (twelve) hours.    . traZODone (DESYREL) 100 MG tablet Take 200 mg by mouth Nightly.    Marland Kitchen acetaminophen (TYLENOL) 325 MG tablet Take 2 tablets (650 mg total) by mouth every 6 (six) hours as needed for mild pain (or Fever >/= 101).    . bisacodyl (DULCOLAX) 5 MG EC tablet Take 1 tablet (5 mg total) by mouth daily as needed for moderate constipation. 30 tablet 0  . HYDROcodone-acetaminophen (NORCO/VICODIN) 5-325 MG tablet Take 1 tablet by mouth every 4 (four) hours as needed for moderate pain. 30 tablet 0  . loratadine (CLARITIN) 10 MG tablet Take 1 tablet (10 mg total) by mouth daily. 30 tablet 0  . nicotine (NICODERM CQ - DOSED IN MG/24 HOURS) 14 mg/24hr patch Place 1 patch (14 mg total) onto the skin daily. 28 patch 0  . OLANZapine (ZYPREXA) 10 MG tablet Take 10 mg  by mouth at bedtime.     Marland Kitchen oxyCODONE (OXYCONTIN) 10 mg 12 hr tablet Take 1 tablet (10 mg total) by mouth every 12 (twelve) hours. 20 tablet 0  . protein supplement shake (PREMIER PROTEIN) LIQD Take 325 mLs (11 oz total) by mouth 2 (two) times daily between meals. 60 Can 0  . senna (SENOKOT) 8.6 MG TABS tablet Take 1 tablet (8.6 mg total) by mouth at bedtime. 120 each 0    Musculoskeletal: Strength & Muscle Tone: within normal limits Gait & Station: normal Patient leans: N/A  Psychiatric Specialty Exam: Physical Exam  Nursing note and vitals reviewed. Constitutional: He appears well-developed and well-nourished.  HENT:  Head: Normocephalic and atraumatic.  Eyes: Pupils are equal, round, and reactive to light. Conjunctivae are normal.  Neck: Normal range of motion.   Cardiovascular: Regular rhythm and normal heart sounds.  Respiratory: Effort normal.  GI: Soft.  Musculoskeletal: Normal range of motion.  Neurological: He is alert.  Skin: Skin is warm and dry.  Psychiatric: His affect is labile and inappropriate. His speech is rapid and/or pressured and tangential. He is agitated and hyperactive. He is not aggressive and not combative. Thought content is paranoid and delusional. Cognition and memory are impaired. He expresses impulsivity and inappropriate judgment. He expresses no homicidal and no suicidal ideation.    Review of Systems  Constitutional: Negative.   HENT: Negative.   Eyes: Negative.   Respiratory: Negative.   Cardiovascular: Negative.   Gastrointestinal: Negative.   Musculoskeletal: Negative.   Skin: Negative.   Neurological: Negative.   Psychiatric/Behavioral: Negative for depression, hallucinations, memory loss, substance abuse and suicidal ideas. The patient is nervous/anxious and has insomnia.     Blood pressure 129/81, pulse 94, temperature 98.4 F (36.9 C), temperature source Oral, resp. rate 16, height _0  (1.727 m), weight 65.8 kg, SpO2 100 %.Body mass index is 22.05 kg/m.  General Appearance: Fairly Groomed  Eye Contact:  Good  Speech:  Clear and Coherent and Pressured  Volume:  Increased  Mood:  Euphoric  Affect:  Labile  Thought Process:  Disorganized  Orientation:  Full (Time, Place, and Person)  Thought Content:  Illogical, Paranoid Ideation, Rumination and Tangential  Suicidal Thoughts:  No  Homicidal Thoughts:  No  Memory:  Immediate;   Fair Recent;   Fair Remote;   Fair  Judgement:  Impaired  Insight:  Shallow  Psychomotor Activity:  Increased  Concentration:  Concentration: Poor  Recall:  [place] of Knowledge:  Good  Language:  Good  Akathisia:  No  Handed:  Right  AIMS (if indicated):     Assets:  Desire for Improvement Financial Resources/Insurance Housing Social Support  ADL's:  Intact   Cognition:  WNL  Sleep:        Treatment Plan Summary: Daily contact with patient to assess and evaluate symptoms and progress in treatment, Medication management and Plan 43 year old man continues to exhibit symptoms of mania.  He has a history of questionable compliance and more worrisome just over the last several months his mania has caused him to get into a serious legal trouble and to damage his relationship with his wife.  Patient continues to meet commitment criteria.  There was some concern about his medical care but at this point it appears that all of his medical needs can be potentially met at our hospital.  If he remains hospitalized on Tuesday he can receive his chemotherapy here if that is what is needed.  Meanwhile I  am going to continue the olanzapine but also add lithium which apparently was of some use in the past.  His kidney function appears to be fine and he should be able to tolerate that.  15-minute checks will be in place.  Disposition: Recommend psychiatric Inpatient admission when medically cleared. Supportive therapy provided about ongoing stressors.  Alethia Berthold, MD 05/16/2018 3:41 PM

## 2018-05-17 LAB — GLUCOSE, CAPILLARY
GLUCOSE-CAPILLARY: 197 mg/dL — AB (ref 70–99)
Glucose-Capillary: 126 mg/dL — ABNORMAL HIGH (ref 70–99)
Glucose-Capillary: 160 mg/dL — ABNORMAL HIGH (ref 70–99)
Glucose-Capillary: 83 mg/dL (ref 70–99)
Glucose-Capillary: 185 mg/dL — ABNORMAL HIGH (ref 70–99)
Glucose-Capillary: 81 mg/dL (ref 70–99)

## 2018-05-17 MED ORDER — LORAZEPAM 2 MG/ML IJ SOLN
2.0000 mg | Freq: Once | INTRAMUSCULAR | Status: AC
  Administered 2018-05-17: 2 mg via INTRAMUSCULAR
  Filled 2018-05-17: qty 1

## 2018-05-17 MED ORDER — HALOPERIDOL LACTATE 5 MG/ML IJ SOLN
5.0000 mg | Freq: Once | INTRAMUSCULAR | Status: AC
  Administered 2018-05-17: 5 mg via INTRAMUSCULAR
  Filled 2018-05-17: qty 1

## 2018-05-17 NOTE — ED Notes (Signed)
Found patient on the floor kneeling down with his hands on his belly.Patient states "I can not stand this pain".Made patient comfortable on the floor.VS stable.Patient got back on the chair by himself.Refsed any assistance to get up.Patient stated that he did not hit anywhere.Pain medicine given.

## 2018-05-17 NOTE — ED Notes (Signed)
Hourly rounding reveals patient in room. No complaints, stable, in no acute distress. Q15 minute rounds and monitoring via Security Cameras to continue. 

## 2018-05-17 NOTE — Progress Notes (Signed)
Pt was under emotional distress in BHU. Ch was present as staff prepare w/ the help of officers to use medicinal means to calm the pt down. Ch spoke w/ pt and he shared how hurt he was that his wife left him while receiving treatment for cancer and committed him to the hospital to receive help. Pt was cordial but and was receptive to the ch visit.     05/17/18 2000  Clinical Encounter Type  Visited With Health care provider;Patient  Visit Type Psychological support;Spiritual support;Social support;Behavioral Health  Referral From Nurse  Consult/Referral To Chaplain  Spiritual Encounters  Spiritual Needs Emotional  Stress Factors  Patient Stress Factors Exhausted;Family relationships;Health changes;Lack of caregivers;Lack of knowledge;Loss of control;Major life changes  Family Stress Factors Family relationships;Health changes;Major life changes

## 2018-05-17 NOTE — ED Provider Notes (Signed)
-----------------------------------------   5:15 PM on 05/17/2018 -----------------------------------------  Pt strolling about the unit yelling curse words at staff regarding a phone call to his cousin. Will give some sedating medications to make sure he doesn't hurt staff or self. Nursing has spoken to his cousin   Schuyler Amor, MD 05/17/18 [phone]

## 2018-05-17 NOTE — ED Notes (Signed)
Patient resting quietly in room. No noted distress or abnormal behaviors noted. Will continue 15 minute checks and observation by security camera for safety. 

## 2018-05-17 NOTE — BH Assessment (Signed)
This Probation officer received call from [place] at [facility] and patient is denied due to aggressive behavior.

## 2018-05-17 NOTE — ED Notes (Signed)
Patient stated that he is frustrated being in the locked unit.Denies SI,HI and AVH.Stated that now he has a place to go in [place] where his exgirlfriend is arranging.Patient is appropriate with staff & peers.Safety maintained.

## 2018-05-17 NOTE — ED Provider Notes (Signed)
-----------------------------------------   6:43 AM on 05/17/2018 -----------------------------------------   Blood pressure (!) 132/98, pulse 86, temperature 98.1 F (36.7 C), temperature source Oral, resp. rate 18, height 5\' 8"  (1.727 m), weight 65.8 kg, SpO2 100 %.  The patient is calm and cooperative at this time.  There have been no acute events since the last update.  Awaiting disposition plan from Behavioral Medicine team.    Paulette Blanch, MD 05/17/18 [phone]

## 2018-05-17 NOTE — BH Assessment (Signed)
Referrals sent to the following:  [facility]

## 2018-05-17 NOTE — BH Assessment (Signed)
This Probation officer contacted [facility] at [phone] and spoke with Jenny Reichmann (Intake Staff) in regards to referral status. John stated he will call me back after he has spoken with psychiatrist. Provided [phone] as contact number.

## 2018-05-17 NOTE — ED Notes (Signed)
Pt yelling, cursing loudly on phone in dayroom. When asked to take the call to his room patient became angry and began cursing at staff. Phone call was ended and phone was taken by Animal nutritionist. Pt loudly cursing, threatening staff.  EDP made aware. Medications administered as ordered with additional security presence.   Maintained on 15 minute checks and observation by security camera for safety.

## 2018-05-17 NOTE — ED Notes (Signed)
Pt. Requested and given drink and snack.

## 2018-05-17 NOTE — BH Assessment (Signed)
Per [place] at [facility] [phone]) no male beds available.

## 2018-05-17 NOTE — ED Provider Notes (Signed)
-----------------------------------------   11:13 AM on 05/17/2018 -----------------------------------------   Blood pressure (!) 132/98, pulse 86, temperature 98.1 F (36.7 C), temperature source Oral, resp. rate 18, height 5\' 8"  (1.727 m), weight 65.8 kg, SpO2 100 %.  The patient is calm and cooperative at this time.  Patient was sitting on a chair and then says that he felt pain and slipped off the chair falling onto his bottom.  I was called to evaluate him by the [place].  However, when I came back he was standing, dancing and the first thing he said to me was "I am dancing to some old school hip."  He is denying any pain.  Ambulating quite well.  I do not believe there is need for work-up for this fall.   Orbie Pyo, MD 05/17/18 1114

## 2018-05-17 NOTE — ED Notes (Signed)
Patient sleeping in bed at this time.

## 2018-05-17 NOTE — ED Notes (Signed)
Family member called "Brayton Layman" called in to check in on patient.  Pt. Currently sleeping but message would get to patient.

## 2018-05-17 NOTE — ED Notes (Signed)
Patient at the day room dancing with the music in the TV.

## 2018-05-18 LAB — GLUCOSE, CAPILLARY
GLUCOSE-CAPILLARY: 148 mg/dL — AB (ref 70–99)
Glucose-Capillary: 177 mg/dL — ABNORMAL HIGH (ref 70–99)
Glucose-Capillary: 57 mg/dL — ABNORMAL LOW (ref 70–99)
Glucose-Capillary: 193 mg/dL — ABNORMAL HIGH (ref 70–99)
Glucose-Capillary: 99 mg/dL (ref 70–99)

## 2018-05-18 MED ORDER — ACETAMINOPHEN 325 MG PO TABS
650.0000 mg | ORAL_TABLET | Freq: Once | ORAL | Status: AC
  Administered 2018-05-18: 650 mg via ORAL
  Filled 2018-05-18: qty 2

## 2018-05-18 MED ORDER — INSULIN ASPART 100 UNIT/ML ~~LOC~~ SOLN
4.0000 [IU] | Freq: Three times a day (TID) | SUBCUTANEOUS | Status: DC
  Administered 2018-05-18 – 2018-05-19 (×3): 4 [IU] via SUBCUTANEOUS
  Filled 2018-05-18 (×2): qty 1

## 2018-05-18 NOTE — ED Notes (Signed)
Hourly rounding reveals patient in day room. No complaints, stable, in no acute distress. Q15 minute rounds and monitoring via Security Cameras to continue. 

## 2018-05-18 NOTE — ED Notes (Signed)
EDP made aware of patient's CBG of 57.  Will re-check CBG in an hour.

## 2018-05-18 NOTE — ED Notes (Signed)
Pt making phone call in dayroom. Maintained on 15 minute checks and observation by security camera for safety.

## 2018-05-18 NOTE — ED Notes (Signed)
Patient IVC/ Pending Placement

## 2018-05-18 NOTE — ED Notes (Addendum)
Pt's wife came to visit. Pt remained calm.  Pt's wife Britt Boozer Achille [phone]) would like to speak with psychiatrist.  Mrs. Saephan is fearful  the patient will be released from the ER without treatment.   Maintained on 15 minute checks and observation by security camera for safety.

## 2018-05-18 NOTE — ED Notes (Signed)
Pt speaking on the phone. Maintained on 15 minute checks and observation by security camera for safety.

## 2018-05-18 NOTE — ED Notes (Signed)
Report to include Situation, Background, Assessment, and Recommendations received from Amy B. RN. Patient alert and oriented, warm and dry, in no acute distress. Patient denies SI, HI, AVH and pain. Patient made aware of Q15 minute rounds and security cameras for their safety. Patient instructed to come to me with needs or concerns. 

## 2018-05-18 NOTE — ED Notes (Signed)
Hourly rounding reveals patient sleeping in room. No complaints, stable, in no acute distress. Q15 minute rounds and monitoring via Security Cameras to continue. 

## 2018-05-18 NOTE — BH Assessment (Addendum)
Referrals sent to the following:  [facility]: (p) [phone] (f) [phone]- Per Gerald Stabs no male beds.   [facility]: (p) [phone] (f) [phone]- No available male beds.  [facility] (p) [phone] ([phone]- No male beds available.

## 2018-05-19 ENCOUNTER — Encounter: Payer: Self-pay | Admitting: Psychiatry

## 2018-05-19 ENCOUNTER — Other Ambulatory Visit: Payer: Self-pay

## 2018-05-19 ENCOUNTER — Inpatient Hospital Stay
Admission: AD | Admit: 2018-05-19 | Discharge: 2018-05-23 | DRG: 885 | Disposition: A | Discharge status: Home / Self Care | Payer: Medicaid Other | Attending: Psychiatry | Admitting: Psychiatry

## 2018-05-19 DIAGNOSIS — K861 Other chronic pancreatitis: Secondary | ICD-10-CM | POA: Diagnosis present

## 2018-05-19 DIAGNOSIS — Z833 Family history of diabetes mellitus: Secondary | ICD-10-CM

## 2018-05-19 DIAGNOSIS — Z803 Family history of malignant neoplasm of breast: Secondary | ICD-10-CM

## 2018-05-19 DIAGNOSIS — C250 Malignant neoplasm of head of pancreas: Secondary | ICD-10-CM | POA: Diagnosis present

## 2018-05-19 DIAGNOSIS — F3113 Bipolar disorder, current episode manic without psychotic features, severe: Secondary | ICD-10-CM | POA: Diagnosis not present

## 2018-05-19 DIAGNOSIS — Z888 Allergy status to other drugs, medicaments and biological substances status: Secondary | ICD-10-CM | POA: Diagnosis not present

## 2018-05-19 DIAGNOSIS — Z794 Long term (current) use of insulin: Secondary | ICD-10-CM | POA: Diagnosis not present

## 2018-05-19 DIAGNOSIS — Z9049 Acquired absence of other specified parts of digestive tract: Secondary | ICD-10-CM | POA: Diagnosis not present

## 2018-05-19 DIAGNOSIS — E119 Type 2 diabetes mellitus without complications: Secondary | ICD-10-CM | POA: Diagnosis present

## 2018-05-19 DIAGNOSIS — F3112 Bipolar disorder, current episode manic without psychotic features, moderate: Secondary | ICD-10-CM | POA: Diagnosis present

## 2018-05-19 DIAGNOSIS — F319 Bipolar disorder, unspecified: Secondary | ICD-10-CM

## 2018-05-19 DIAGNOSIS — Z801 Family history of malignant neoplasm of trachea, bronchus and lung: Secondary | ICD-10-CM | POA: Diagnosis not present

## 2018-05-19 LAB — HEMOGLOBIN A1C
Hgb A1c MFr Bld: 6.4 % — ABNORMAL HIGH (ref 4.8–5.6)
Mean Plasma Glucose: 136.98 mg/dL

## 2018-05-19 LAB — GLUCOSE, CAPILLARY
Glucose-Capillary: 125 mg/dL — ABNORMAL HIGH (ref 70–99)
Glucose-Capillary: 196 mg/dL — ABNORMAL HIGH (ref 70–99)
Glucose-Capillary: 194 mg/dL — ABNORMAL HIGH (ref 70–99)
Glucose-Capillary: 99 mg/dL (ref 70–99)
Glucose-Capillary: 137 mg/dL — ABNORMAL HIGH (ref 70–99)
Glucose-Capillary: 205 mg/dL — ABNORMAL HIGH (ref 70–99)

## 2018-05-19 MED ORDER — PANCRELIPASE (LIP-PROT-AMYL) 12000-38000 UNITS PO CPEP
36000.0000 [IU] | ORAL_CAPSULE | Freq: Four times a day (QID) | ORAL | Status: DC
  Administered 2018-05-19 – 2018-05-23 (×14): 36000 [IU] via ORAL
  Filled 2018-05-19 (×17): qty 3

## 2018-05-19 MED ORDER — LORAZEPAM 0.5 MG PO TABS
0.5000 mg | ORAL_TABLET | Freq: Every evening | ORAL | Status: DC | PRN
  Administered 2018-05-19: 0.5 mg via ORAL
  Filled 2018-05-19: qty 1

## 2018-05-19 MED ORDER — SENNA 8.6 MG PO TABS
1.0000 | ORAL_TABLET | Freq: Every day | ORAL | Status: DC
  Administered 2018-05-19 – 2018-05-22 (×4): 8.6 mg via ORAL
  Filled 2018-05-19 (×4): qty 1

## 2018-05-19 MED ORDER — BISACODYL 5 MG PO TBEC
5.0000 mg | DELAYED_RELEASE_TABLET | Freq: Every day | ORAL | Status: DC | PRN
  Filled 2018-05-19: qty 1

## 2018-05-19 MED ORDER — ACETAMINOPHEN 325 MG PO TABS: 650.0000 mg | ORAL_TABLET | Freq: Four times a day (QID) | ORAL | Status: DC | PRN

## 2018-05-19 MED ORDER — PREMIER PROTEIN SHAKE: 11.0000 [oz_av] | Freq: Two times a day (BID) | ORAL | Status: DC

## 2018-05-19 MED ORDER — NICOTINE 14 MG/24HR TD PT24
14.0000 mg | MEDICATED_PATCH | Freq: Every day | TRANSDERMAL | Status: DC
  Administered 2018-05-19 – 2018-05-22 (×4): 14 mg via TRANSDERMAL
  Filled 2018-05-19 (×5): qty 1

## 2018-05-19 MED ORDER — PANCRELIPASE (LIP-PROT-AMYL) 12000-38000 UNITS PO CPEP
36000.0000 [IU] | ORAL_CAPSULE | Freq: Three times a day (TID) | ORAL | Status: DC
  Administered 2018-05-19: 36000 [IU] via ORAL
  Filled 2018-05-19 (×3): qty 3

## 2018-05-19 MED ORDER — AMOXICILLIN 500 MG PO CAPS
1000.0000 mg | ORAL_CAPSULE | Freq: Two times a day (BID) | ORAL | Status: DC
  Administered 2018-05-19 – 2018-05-23 (×8): 1000 mg via ORAL
  Filled 2018-05-19 (×10): qty 2

## 2018-05-19 MED ORDER — ALUM & MAG HYDROXIDE-SIMETH 200-200-20 MG/5ML PO SUSP: 30.0000 mL | ORAL | Status: DC | PRN

## 2018-05-19 MED ORDER — HYDROMORPHONE HCL 2 MG PO TABS: 4.0000 mg | ORAL_TABLET | ORAL | Status: DC | PRN

## 2018-05-19 MED ORDER — HYDROCODONE-ACETAMINOPHEN 5-325 MG PO TABS
1.0000 | ORAL_TABLET | ORAL | Status: DC | PRN
  Administered 2018-05-19 – 2018-05-23 (×10): 1 via ORAL
  Filled 2018-05-19 (×10): qty 1

## 2018-05-19 MED ORDER — CLARITHROMYCIN 500 MG PO TABS
500.0000 mg | ORAL_TABLET | Freq: Two times a day (BID) | ORAL | Status: DC
  Administered 2018-05-19 – 2018-05-21 (×4): 500 mg via ORAL
  Filled 2018-05-19 (×5): qty 1

## 2018-05-19 MED ORDER — LITHIUM CARBONATE ER 300 MG PO TBCR
600.0000 mg | EXTENDED_RELEASE_TABLET | Freq: Every day | ORAL | Status: DC
  Administered 2018-05-19 – 2018-05-20 (×2): 600 mg via ORAL
  Filled 2018-05-19 (×2): qty 2

## 2018-05-19 MED ORDER — OLANZAPINE 5 MG PO TABS
15.0000 mg | ORAL_TABLET | Freq: Every day | ORAL | Status: DC
  Administered 2018-05-19 – 2018-05-22 (×4): 15 mg via ORAL
  Filled 2018-05-19 (×4): qty 1

## 2018-05-19 MED ORDER — MAGNESIUM HYDROXIDE 400 MG/5ML PO SUSP: 30.0000 mL | Freq: Every day | ORAL | Status: DC | PRN

## 2018-05-19 MED ORDER — LORATADINE 10 MG PO TABS
10.0000 mg | ORAL_TABLET | Freq: Every day | ORAL | Status: DC
  Administered 2018-05-20 – 2018-05-23 (×4): 10 mg via ORAL
  Filled 2018-05-19 (×5): qty 1

## 2018-05-19 NOTE — ED Notes (Signed)
Hourly rounding reveals patient sleeping in room. No complaints, stable, in no acute distress. Q15 minute rounds and monitoring via Security Cameras to continue. 

## 2018-05-19 NOTE — BH Assessment (Signed)
Patient is to be admitted to [facility] by Dr. Weber Cooks.  Attending Physician will be Dr. Weber Cooks.   Patient has been assigned to room 322, by [place].   Intake Paper Work has been signed and placed on patient chart.  ER staff is aware of the admission:  Raquel Sarna, ER Secretary    Dr. Alfred Levins, ER MD   Berton Lan, Patient's Nurse   Johnnye Lana, Patient Access.

## 2018-05-19 NOTE — ED Notes (Signed)
Pt transferred to lower level BMU unit.  Pt stable at time of transfer.  Denies SI, HI, and A/V hallucinations.  Pt in dayroom laughing and dancing with other patient prior to transfer.

## 2018-05-19 NOTE — Progress Notes (Signed)
Inpatient Diabetes Program Recommendations  AACE/ADA: New Consensus Statement on Inpatient Glycemic Control (2015)  Target Ranges:  Prepandial:   less than 140 mg/dL      Peak postprandial:   less than 180 mg/dL (1-2 hours)      Critically ill patients:  140 - 180 mg/dL   Results for Travis Hawkins, Travis Hawkins (MRN [id number]) as of 05/19/2018 08:20  Ref. Range 05/17/2018 08:14 05/17/2018 09:16 05/17/2018 12:08 05/17/2018 17:35 05/17/2018 17:36 05/17/2018 21:29  Glucose-Capillary Latest Ref Range: 70 - 99 mg/dL 126 (H)  9 units NOVOLOG  160 (H) 83 197 (H)  10 units NOVOLOG  185 (H) 81   Results for Travis Hawkins, Travis Hawkins (MRN [id number]) as of 05/19/2018 08:20  Ref. Range 05/18/2018 07:59 05/18/2018 11:43 05/18/2018 12:52 05/18/2018 17:18 05/18/2018 21:07  Glucose-Capillary Latest Ref Range: 70 - 99 mg/dL 177 (H)  10 units NOVOLOG  57 (L) 148 (H) 193 (H)  6 units NOVOLOG  99   Results for Travis Hawkins, Travis Hawkins (MRN [id number]) as of 05/19/2018 08:20  Ref. Range 05/19/2018 07:35  Glucose-Capillary Latest Ref Range: 70 - 99 mg/dL 125 (H)  5 units NOVOLOG     To ED: IVC due to disturbance with wife (per ED MD notes: "IVC paperwork indicates the patient was belligerent, threatening to kill his wife, and seem to be having racing/fleeting thoughts")  History: DM, Pancreatic Cancer  Home DM Meds: Humalog 8 units TID with meals       Humalog SSI       (see notes from Care Everywhere from PCP visit 05/08/2018)  Current Orders: Novolog Sensitive Correction Scale/ SSI (0-9 units) TID AC      Novolog 4 units TID with meals     Note patient had mild Hypoglycemic event yesterday at 11:43am after receiving 10 units Novolog (2 units SSI + 8 units meal coverage) at 8am.  Note that Novolog Meal Coverage doses reduced to 4 units TID.  Agree with downward adjustment for now.  CBG 125 mg/dl this AM.    --Will follow patient during hospitalization--  Wyn Quaker RN, MSN, CDE Diabetes Coordinator Inpatient  Glycemic Control Team Team Pager: [phone] (8a-5p)

## 2018-05-19 NOTE — Tx Team (Signed)
Initial Treatment Plan 05/19/2018 6:03 PM Travis Hawkins MPN:[id number]    PATIENT STRESSORS: Health problems Loss of mother Marital or family conflict Medication change or noncompliance   PATIENT STRENGTHS: Ability for insight Active sense of humor Average or above average intelligence Capable of independent living Communication skills [place] of knowledge Motivation for treatment/growth Special hobby/interest Supportive family/friends Work skills   PATIENT IDENTIFIED PROBLEMS: Psychosis 05/19/2018   Medication non-compliance 05/19/2018  Stress 05/19/2018                 DISCHARGE CRITERIA:  Improved stabilization in mood, thinking, and/or behavior Motivation to continue treatment in a less acute level of care Need for constant or close observation no longer present Verbal commitment to aftercare and medication compliance  PRELIMINARY DISCHARGE PLAN: Outpatient therapy Participate in family therapy Return to previous living arrangement Return to previous work or school arrangements  PATIENT/FAMILY INVOLVEMENT: This treatment plan has been presented to and reviewed with the patient, Travis Hawkins.  The patient has been given the opportunity to ask questions and make suggestions.  Reyes Ivan, RN 05/19/2018, 6:03 PM

## 2018-05-19 NOTE — ED Notes (Signed)
Pt requested to see chaplain.  [facility] on call chaplain called multiple times.  No answer received.

## 2018-05-19 NOTE — ED Notes (Signed)
Pt speaking with chaplain at this time.

## 2018-05-19 NOTE — Progress Notes (Addendum)
D: Received patient from [facility] ER. Patient skin assessment completed with Gigi, RN, skin is intact, but did have a noticeable medium size bruise on his back. No contraband found during search with all unit prohibited items locked and stored for discharge. Pt. Was admitted under the services of, Dr. Weber Cooks.   Patient pleasant and cooperative during the admissions process. Pt. Able to complete signing paperwork and assessments. Pt. During interviewing is calm and appropriate. Pt. Affect is appropriate, but slightly agitated when discussing his wife, "involuntarily committing me".   Pt. Denies si/hi/avh, can contract for safety. Pt. Reports chronic pain that is being addressed utilizing MD orders. Pt. Expresses that he is here, because, "my wife had me sent here when I was coming home and she called the cops and had them arrest me". Pt. Denies depression and anxiety. Pt. Expresses he would while he is here like to be helped with getting setup with an act team of some sort and to address the issues he is having with his wife.    A: Patient oriented to unit/room/call light. Pt. Given extensive admissions education. Patient was encourage to participate in unit activities and continue with plan of care being put into place. Q x 15 minute observation checks were initiated for safety.   R: Patient is receptive to treatment plan being put into place and safety to be maintained on unit.

## 2018-05-19 NOTE — Consult Note (Signed)
[facility] Face-to-Face Psychiatry Consult   Reason for Consult: Consult for this 43 year old man with a history of bipolar disorder also with multiple medical problems.  Patient has presented as manic Referring Physician: Corky Downs Patient Identification: Travis Hawkins MRN:  [id number] Principal Diagnosis: Bipolar affective disorder, manic, severe ([place]) Diagnosis:  Principal Problem:   Bipolar affective disorder, manic, severe ([place]) Active Problems:   Malignant tumor of head of pancreas ([place])   Diabetes mellitus ([place])   Chronic pancreatitis ([place])   Total Time spent with patient: 1 hour  Subjective:   Travis Hawkins is a 43 y.o. male patient admitted with "this is all because of my wife".  HPI: Patient seen chart reviewed.   On reevaluation today found the patient to still be hyperverbal and tangential disorganized and although not threatening to harm anyone still paranoid and delusional and blaming his wife for all of his problems.  Patient has been compliant with prescribed Zyprexa since being in the emergency room.  Patient has partial insight understanding that he is still having some symptoms of mania and increasing irritability. Patient is denying suicidal and homicidal ideation today.  He denies auditory and visual hallucinations.  Patient is easily agitated but does respond to redirection.  Per nursing report, patient only slept a total of 4 hours overnight into 2-hour blocks.  Patient reported that he could not sleep because he has not been prescribed his Ativan 0.5 mg which allows him to sleep 8 hours.  Review of medical administration record reveals that this medication is ordered as needed for bedtime, however patient has not requested it. Patient has been actively looking for places to live, and shows a add in the classified paper of a room that he can rent.  He continues to discuss suspicion of his wife, and plans to get a restraining order against her.  He then asks for [place] to contact wife so  that she can bring his mail in his phone.  He states he is willing to see her, however also states that she is a trigger which caused him to have worsening mania over the weekend when she visited. Spoke with Dr. Earnestine Mealing, oncologist at [facility].  Dr. Earnestine Mealing reports that he has had ongoing concern for Mr. Vallejo behavior. He describes that currently chemotherapy is palliative only.  He has had breaks in his treatment before with the longest delay being 2 months while patient was in jail.  Patient has been on and off therapy due to social and behavioral issues.  There is additional concern that current chemotherapy regimen would require patient to receive Decadron, which could be contraindicated in his current manic state. Dr. Earnestine Mealing reports that he will continue chemotherapy for Mr. Mccreadie once his psychiatric condition is controlled..   Risk to Self: Suicidal Ideation: No Suicidal Intent: No Is patient at risk for suicide?: No, but patient needs Medical Clearance Suicidal Plan?: No Access to Means: No What has been your use of drugs/alcohol within the last 12 months?: BAL 241 UDS (+)Cannabinoid(Pt denied ) How many times?: 0 Triggers for Past Attempts: None known Intentional Self Injurious Behavior: None Risk to Others: Homicidal Ideation: No Thoughts of Harm to Others: No Current Homicidal Intent: No Current Homicidal Plan: No Access to Homicidal Means: No Identified Victim: none  History of harm to others?: No Assessment of Violence: None Noted Violent Behavior Description: none  Does patient have access to weapons?: No Criminal Charges Pending?: No Does patient have a court date: No Prior  Inpatient Therapy: Prior Inpatient Therapy: Yes Prior Therapy Dates: Unknown  Prior Therapy Facilty/Provider(s): [facility]  Reason for Treatment: Bipolar Disorder  Prior Outpatient Therapy: Prior Outpatient Therapy: No Does patient have an ACCT team?: No Does patient have Intensive  In-House Services?  : No Does patient have [facility] services? : No Does patient have P4CC services?: No  Past Medical History:  Past Medical History:  Diagnosis Date  . Biliary obstruction   . Depression   . Mood disorder [facility])     Past Surgical History:  Procedure Laterality Date  . CHOLECYSTECTOMY    . ENDOSCOPIC RETROGRADE CHOLANGIOPANCREATOGRAPHY (ERCP) WITH PROPOFOL N/A 03/11/2017   Procedure: ENDOSCOPIC RETROGRADE CHOLANGIOPANCREATOGRAPHY (ERCP) WITH PROPOFOL;  Surgeon: Lucilla Lame, MD;  Location: [facility] ENDOSCOPY;  Service: Endoscopy;  Laterality: N/A;  . ERCP N/A 03/19/2017   Procedure: ENDOSCOPIC RETROGRADE CHOLANGIOPANCREATOGRAPHY (ERCP);  Surgeon: Lucilla Lame, MD;  Location: [facility] ENDOSCOPY;  Service: Endoscopy;  Laterality: N/A;  . ERCP N/A 06/25/2017   Procedure: ENDOSCOPIC RETROGRADE CHOLANGIOPANCREATOGRAPHY (ERCP);  Surgeon: Lucilla Lame, MD;  Location: [facility] ENDOSCOPY;  Service: Endoscopy;  Laterality: N/A;  . ERCP N/A 07/16/2017   Procedure: ENDOSCOPIC RETROGRADE CHOLANGIOPANCREATOGRAPHY (ERCP);  Surgeon: Lucilla Lame, MD;  Location: [facility] ENDOSCOPY;  Service: Endoscopy;  Laterality: N/A;  . none     Family History:  Family History  Problem Relation Age of Onset  . Cancer Maternal Aunt   . Breast cancer Maternal Grandmother   . Lung cancer Maternal Grandfather   . Diabetes Mellitus II Mother    Family Psychiatric  History: None known Social History:  Social History   Substance and Sexual Activity  Alcohol Use No  . Frequency: Never   Comment: Occasional wine about every 3-4 months     Social History   Substance and Sexual Activity  Drug Use No    Social History   Socioeconomic History  . Marital status: Divorced    Spouse name: Not on file  . Number of children: Not on file  . Years of education: Not on file  . Highest education level: Not on file  Occupational History  . Not on file  Social Needs  . Financial resource strain: Not on file  . Food  insecurity:    Worry: Not on file    Inability: Not on file  . Transportation needs:    Medical: Not on file    Non-medical: Not on file  Tobacco Use  . Smoking status: Former Smoker    Packs/day: 1.00    Years: 18.00    Pack years: 18.00    Types: E-cigarettes  . Smokeless tobacco: Never Used  Substance and Sexual Activity  . Alcohol use: No    Frequency: Never    Comment: Occasional wine about every 3-4 months  . Drug use: No  . Sexual activity: Yes    Partners: Female  Lifestyle  . Physical activity:    Days per week: Not on file    Minutes per session: Not on file  . Stress: Not on file  Relationships  . Social connections:    Talks on phone: Not on file    Gets together: Not on file    Attends religious service: Not on file    Active member of club or organization: Not on file    Attends meetings of clubs or organizations: Not on file    Relationship status: Not on file  Other Topics Concern  . Not on file  Social History Narrative  . Not  on file   Additional Social History:    Social history: Patient is married although his wife is now afraid of him and uncomfortable living with him until he is much better mentally.  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  Medical history: Patient has stage III pancreatic cancer with extension into his liver.  He is being treated for this with chemotherapy primarily at [facility] in their cancer clinic.  Additionally he has diabetes with some difficulty in controlling it and some chronic pancreatitis symptoms requiring pancreatic enzymes.  Substance abuse history: Denies alcohol or drug abuse recently although does admit that he is intermittently use marijuana over the last several weeks.  Does not appear to have ever felt it was a treatable problem  Past Psychiatric History: Everyone agrees that the patient first showed signs of bipolar disorder at approximately age 63.  He had a clear-cut manic  episode and was hospitalized at that time.  After that however it appears that he was almost completely stable for about 20 years until this recent episode of decompensation.  No history of suicide attempts.  For years it appears that Zyprexa was a maintenance medication.  Patient remembers however there have been times in the past when he was acutely treated with lithium.  There was an effort to get him onto lamotrigine earlier this year but he would not comply with it. Allergies:   Allergies  Allergen Reactions  . Benztropine Other (See Comments)    Tongue swelling   . Lamotrigine Other (See Comments)    DILI 2018    Labs:  Results for orders placed or performed during the hospital encounter of 05/14/18 (from the past 48 hour(s))  Glucose, capillary     Status: Abnormal   Collection Time: 05/17/18  5:35 PM  Result Value Ref Range   Glucose-Capillary 197 (H) 70 - 99 mg/dL   Comment 1 Notify RN   Glucose, capillary     Status: Abnormal   Collection Time: 05/17/18  5:36 PM  Result Value Ref Range   Glucose-Capillary 185 (H) 70 - 99 mg/dL   Comment 1 Notify RN   Glucose, capillary     Status: None   Collection Time: 05/17/18  9:29 PM  Result Value Ref Range   Glucose-Capillary 81 70 - 99 mg/dL  Glucose, capillary     Status: Abnormal   Collection Time: 05/18/18  7:59 AM  Result Value Ref Range   Glucose-Capillary 177 (H) 70 - 99 mg/dL  Glucose, capillary     Status: Abnormal   Collection Time: 05/18/18 11:43 AM  Result Value Ref Range   Glucose-Capillary 57 (L) 70 - 99 mg/dL  Glucose, capillary     Status: Abnormal   Collection Time: 05/18/18 12:52 PM  Result Value Ref Range   Glucose-Capillary 148 (H) 70 - 99 mg/dL  Glucose, capillary     Status: Abnormal   Collection Time: 05/18/18  5:18 PM  Result Value Ref Range   Glucose-Capillary 193 (H) 70 - 99 mg/dL  Glucose, capillary     Status: None   Collection Time: 05/18/18  9:07 PM  Result Value Ref Range   Glucose-Capillary  99 70 - 99 mg/dL  Glucose, capillary     Status: Abnormal   Collection Time: 05/19/18  7:35 AM  Result Value Ref Range   Glucose-Capillary 125 (H) 70 - 99 mg/dL  Glucose, capillary     Status: Abnormal   Collection Time: 05/19/18  10:20 AM  Result Value Ref Range   Glucose-Capillary 196 (H) 70 - 99 mg/dL  Glucose, capillary     Status: Abnormal   Collection Time: 05/19/18 11:45 AM  Result Value Ref Range   Glucose-Capillary 194 (H) 70 - 99 mg/dL    Current Facility-Administered Medications  Medication Dose Route Frequency Provider Last Rate Last Dose  . amoxicillin (AMOXIL) capsule 1,000 mg  1,000 mg Oral Q12H Nance Pear, MD   1,000 mg at 05/19/18 5366  . bisacodyl (DULCOLAX) EC tablet 5 mg  5 mg Oral Daily PRN Lavella Hammock, MD      . clarithromycin Binnie Rail) tablet 500 mg  500 mg Oral Q12H Nance Pear, MD   500 mg at 05/19/18 4403  . HYDROcodone-acetaminophen (NORCO/VICODIN) 5-325 MG per tablet 1 tablet  1 tablet Oral Q4H PRN Lavella Hammock, MD      . HYDROmorphone (DILAUDID) tablet 4 mg  4 mg Oral Q4H PRN Darel Hong, MD   4 mg at 05/18/18 1253  . insulin aspart (novoLOG) injection 0-9 Units  0-9 Units Subcutaneous TID WC Delman Kitten, MD   2 Units at 05/19/18 1210  . insulin aspart (novoLOG) injection 4 Units  4 Units Subcutaneous TID WC Delman Kitten, MD   4 Units at 05/19/18 1207  . lipase/protease/amylase (CREON) capsule 36,000 Units  36,000 Units Oral TID AC Clapacs, Madie Reno, MD   36,000 Units at 05/19/18 1209  . lithium carbonate (LITHOBID) CR tablet 600 mg  600 mg Oral QHS Clapacs, John T, MD   600 mg at 05/17/18 2141  . loratadine (CLARITIN) tablet 10 mg  10 mg Oral Daily Lavella Hammock, MD   10 mg at 05/19/18 4742  . LORazepam (ATIVAN) tablet 0.5 mg  0.5 mg Oral QHS PRN Lavella Hammock, MD      . nicotine (NICODERM CQ - dosed in mg/24 hours) patch 14 mg  14 mg Transdermal Daily Lavella Hammock, MD   14 mg at 05/19/18 0834  . OLANZapine (ZYPREXA) tablet 15 mg   15 mg Oral QHS Lavella Hammock, MD   15 mg at 05/18/18 2108  . protein supplement (PREMIER PROTEIN) liquid - approved for s/p bariatric surgery  11 oz Oral BID BM Lavella Hammock, MD   Stopped at 05/18/18 1020  . senna (SENOKOT) tablet 8.6 mg  1 tablet Oral QHS Lavella Hammock, MD   8.6 mg at 05/18/18 2108   Current Outpatient Medications  Medication Sig Dispense Refill  . acetaminophen (TYLENOL) 325 MG tablet Take 2 tablets (650 mg total) by mouth every 6 (six) hours as needed for mild pain (or Fever >/= 101).    . colestipol (COLESTID) 1 g tablet Take 1 g by mouth every 6 (six) hours.    . ferrous sulfate 324 (65 Fe) MG TBEC Take 324 mg by mouth daily with breakfast.    . gabapentin (NEURONTIN) 100 MG capsule Take 100 mg by mouth 2 (two) times daily.    Marland Kitchen HYDROcodone-acetaminophen (NORCO/VICODIN) 5-325 MG tablet Take 1 tablet by mouth every 4 (four) hours as needed for moderate pain. 30 tablet 0  . HYDROmorphone (DILAUDID) 4 MG tablet Take 4 mg by mouth every 4 (four) hours as needed for severe pain.    Marland Kitchen insulin lispro (HUMALOG KWIKPEN) 100 UNIT/ML KwikPen Inject 8 Units into the skin 3 (three) times daily with meals.    . Melatonin 3 MG TABS Take 3 mg by mouth Nightly.    Marland Kitchen  OLANZapine (ZYPREXA) 10 MG tablet Take 10 mg by mouth at bedtime.     Marland Kitchen oxyCODONE (OXYCONTIN) 10 mg 12 hr tablet Take 1 tablet (10 mg total) by mouth every 12 (twelve) hours. 20 tablet 0  . Pancrelipase, Lip-Prot-Amyl, 24000-76000 units CPEP Take 4 capsules with first bite of meals and 3 capsules with snacks    . pantoprazole (PROTONIX) 40 MG tablet Take 40 mg by mouth every 12 (twelve) hours.    . senna (SENOKOT) 8.6 MG TABS tablet Take 1 tablet (8.6 mg total) by mouth at bedtime. 120 each 0  . traZODone (DESYREL) 100 MG tablet Take 200 mg by mouth Nightly.    Marland Kitchen amoxicillin (AMOXIL) 500 MG tablet Take 1,000 mg by mouth every 12 (twelve) hours.    . bisacodyl (DULCOLAX) 5 MG EC tablet Take 1 tablet (5 mg total) by  mouth daily as needed for moderate constipation. (Patient not taking: Reported on 05/17/2018) 30 tablet 0  . clarithromycin (BIAXIN) 500 MG tablet Take 500 mg by mouth every 12 (twelve) hours.    Marland Kitchen loratadine (CLARITIN) 10 MG tablet Take 1 tablet (10 mg total) by mouth daily. (Patient not taking: Reported on 05/17/2018) 30 tablet 0  . nicotine (NICODERM CQ - DOSED IN MG/24 HOURS) 14 mg/24hr patch Place 1 patch (14 mg total) onto the skin daily. (Patient not taking: Reported on 05/17/2018) 28 patch 0  . protein supplement shake (PREMIER PROTEIN) LIQD Take 325 mLs (11 oz total) by mouth 2 (two) times daily between meals. (Patient not taking: Reported on 05/17/2018) 60 Can 0    Musculoskeletal: Strength & Muscle Tone: within normal limits Gait & Station: normal Patient leans: N/A  Psychiatric Specialty Exam: Physical Exam  Nursing note and vitals reviewed. Constitutional: He is oriented to person, place, and time. He appears well-developed and well-nourished.  HENT:  Head: Normocephalic and atraumatic.  Eyes: Pupils are equal, round, and reactive to light. Conjunctivae are normal.  Neck: Normal range of motion.  Cardiovascular: Normal rate and normal heart sounds.  Respiratory: Effort normal.  GI: Soft.  Musculoskeletal: Normal range of motion.  Neurological: He is alert and oriented to person, place, and time.  Skin: Skin is warm and dry.  Psychiatric: His affect is labile and inappropriate. His speech is rapid and/or pressured and tangential. He is agitated and hyperactive. He is not aggressive and not combative. Thought content is paranoid and delusional. Cognition and memory are impaired. He expresses impulsivity and inappropriate judgment. He expresses no homicidal and no suicidal ideation.    Review of Systems  Constitutional: Negative.   HENT: Negative.   Eyes: Negative.   Respiratory: Negative.   Cardiovascular: Negative.   Gastrointestinal: Negative.   Musculoskeletal: Negative.    Skin: Negative.   Neurological: Negative.   Psychiatric/Behavioral: Negative for depression, hallucinations, memory loss, substance abuse and suicidal ideas. The patient is nervous/anxious and has insomnia.     Blood pressure 110/76, pulse 98, temperature 98.5 F (36.9 C), temperature source Oral, resp. rate 16, height 5\' 8"  (1.727 m), weight 65.8 kg, SpO2 100 %.Body mass index is 22.05 kg/m.  General Appearance: Fairly Groomed  Eye Contact:  Good  Speech:  Clear and Coherent and Pressured  Volume:  Increased  Mood:  Euphoric and Irritable  Affect:  Labile  Thought Process:  Disorganized  Orientation:  Full (Time, Place, and Person)  Thought Content:  Illogical, Paranoid Ideation, Rumination and Tangential  Suicidal Thoughts:  No  Homicidal Thoughts:  No  Memory:  Immediate;   Fair Recent;   Fair Remote;   Fair  Judgement:  Poor  Insight:  Shallow  Psychomotor Activity:  Increased  Concentration:  Concentration: Poor  Recall:  [place] of Knowledge:  Good  Language:  Good  Akathisia:  No  Handed:  Right  AIMS (if indicated):   0  Assets:  Desire for Improvement Financial Resources/Insurance Housing Social Support  ADL's:  Intact  Cognition:  WNL  Sleep:       Treatment Plan Summary: Daily contact with patient to assess and evaluate symptoms and progress in treatment, Medication management and Plan 43 year old man continues to exhibit symptoms of mania.  He has a history of questionable compliance and more worrisome just over the last several months his mania has caused him to get into a serious legal trouble and to damage his relationship with his wife.  Patient continues to meet commitment criteria.  There was some concern about his medical care, however I have spoken with his oncologist, who stated that his chemotherapy is on hold until his psychaitric illness is controlled.Continue the olanzapine and lithium which apparently was of some use in the past.  His kidney  function appears to be fine and he should be able to tolerate that.  15-minute checks will be in place.  Disposition: Recommend psychiatric Inpatient admission when medically cleared. Supportive therapy provided about ongoing stressors.  Lavella Hammock, MD 05/19/2018 1:29 PM

## 2018-05-19 NOTE — Plan of Care (Signed)
Patient just recently admitted to the unit. Patient has not had sufficient time to show progressions at this time. Will continue to monitor for progressions.  Problem: Education: Goal: Knowledge of [facility] Education information/materials will improve Outcome: Not Progressing Goal: Emotional status will improve Outcome: Not Progressing Goal: Mental status will improve Outcome: Not Progressing Goal: Verbalization of understanding the information provided will improve Outcome: Not Progressing   Problem: Activity: Goal: Interest or engagement in activities will improve Outcome: Not Progressing Goal: Sleeping patterns will improve Outcome: Not Progressing   Problem: Coping: Goal: Ability to verbalize frustrations and anger appropriately will improve Outcome: Not Progressing Goal: Ability to demonstrate self-control will improve Outcome: Not Progressing   Problem: Health Behavior/Discharge Planning: Goal: Identification of resources available to assist in meeting health care needs will improve Outcome: Not Progressing Goal: Compliance with treatment plan for underlying cause of condition will improve Outcome: Not Progressing   Problem: Physical Regulation: Goal: Ability to maintain clinical measurements within normal limits will improve Outcome: Not Progressing   Problem: Safety: Goal: Periods of time without injury will increase Outcome: Not Progressing   Problem: Education: Goal: Will be free of psychotic symptoms Outcome: Not Progressing Goal: Knowledge of the prescribed therapeutic regimen will improve Outcome: Not Progressing   Problem: Coping: Goal: Coping ability will improve Outcome: Not Progressing Goal: Will verbalize feelings Outcome: Not Progressing   Problem: Health Behavior/Discharge Planning: Goal: Compliance with prescribed medication regimen will improve Outcome: Not Progressing   Problem: Role Relationship: Goal: Ability to communicate  needs accurately will improve Outcome: Not Progressing Goal: Ability to interact with others will improve Outcome: Not Progressing   Problem: Self-Concept: Goal: Will verbalize positive feelings about self Outcome: Not Progressing

## 2018-05-19 NOTE — ED Notes (Signed)
Hourly rounding reveals patient in room. No complaints, stable, in no acute distress. Q15 minute rounds and monitoring via Security Cameras to continue. 

## 2018-05-19 NOTE — Progress Notes (Signed)
Ch f/u with pt that was still in the the B[facility]5 pod. Pt emotional distress was about missing his chemo treatment and was not sure how to have it rescheduled. Pt was upset that he was moving to the [facility] area rather than getting chemo. Pt was tearful yet receptive to spiritual care provided to him. Psalms 91 was read aloud and encouraged to understand that he has to get thru this process. Pt is open to continued care.    05/19/18 1445  Clinical Encounter Type  Visited With Patient  Visit Type Follow-up;Psychological support;Spiritual support;Social support;Behavioral Health  Referral From Chaplain  Consult/Referral To Chaplain  Spiritual Encounters  Spiritual Needs [facility] text;Emotional;Grief support  Stress Factors  Patient Stress Factors Loss;Loss of control;Major life changes;Exhausted;Family relationships  Family Stress Factors None identified

## 2018-05-19 NOTE — ED Notes (Signed)
Pt loud, cursing, and agitated towards staff and Animal nutritionist.  Pt yelling at staff stating, "Y'all are lying to me!  I called my oncologist and he said that there hasn't been any correspondence between the people here and [facility]!" Pt constantly knocking on window making demands to see the doctor.  Pt stating that he has chemo tomorrow and he has to get out of here.

## 2018-05-19 NOTE — ED Provider Notes (Signed)
-----------------------------------------   6:31 AM on 05/19/2018 -----------------------------------------   Blood pressure 110/76, pulse 98, temperature 98.5 F (36.9 C), temperature source Oral, resp. rate 16, height 5\' 8"  (1.727 m), weight 65.8 kg, SpO2 100 %.  The patient is calm and cooperative at this time.  There have been no acute events since the last update.  Awaiting disposition plan from Behavioral Medicine team.    Paulette Blanch, MD 05/19/18 [phone]

## 2018-05-20 DIAGNOSIS — F3112 Bipolar disorder, current episode manic without psychotic features, moderate: Principal | ICD-10-CM

## 2018-05-20 LAB — GLUCOSE, CAPILLARY
GLUCOSE-CAPILLARY: 129 mg/dL — AB (ref 70–99)
Glucose-Capillary: 200 mg/dL — ABNORMAL HIGH (ref 70–99)
Glucose-Capillary: 151 mg/dL — ABNORMAL HIGH (ref 70–99)

## 2018-05-20 NOTE — BHH Group Notes (Signed)
LCSW Group Therapy Note  05/20/2018 1:00 PM  Type of Therapy/Topic:  Group Therapy:  Feelings about Diagnosis  Participation Level:  Active   Description of Group:   This group will allow patients to explore their thoughts and feelings about diagnoses they have received. Patients will be guided to explore their level of understanding and acceptance of these diagnoses. Facilitator will encourage patients to process their thoughts and feelings about the reactions of others to their diagnosis and will guide patients in identifying ways to discuss their diagnosis with significant others in their lives. This group will be process-oriented, with patients participating in exploration of their own experiences, giving and receiving support, and processing challenge from other group members.   Therapeutic Goals: 1. Patient will demonstrate understanding of diagnosis as evidenced by identifying two or more symptoms of the disorder 2. Patient will be able to express two feelings regarding the diagnosis 3. Patient will demonstrate their ability to communicate their needs through discussion and/or role play  Summary of Patient Progress: Patient was present for group and an active participant. Patient shared with the group that he has struggled with mania.  Patient shared how he has struggled with maintaining a relationship with his wife and children following his diagnosis and after some actions that he made while in a manic episode.  Patient reports that he has felt anger and frustration with his diagnosis as well as hopelessness.  Patient identified that his family has felt confusion, anger or frustration and fear.  Patient received support from other group members and was supportive to others.    Therapeutic Modalities:   Cognitive Behavioral Therapy Brief Therapy Feelings Identification   Assunta Curtis, MSW, LCSW 05/20/2018 2:47 PM

## 2018-05-20 NOTE — BHH Counselor (Signed)
Adult Comprehensive Assessment  Patient ID: Travis Hawkins, male   DOB: Jun 08, 1975, 43 y.o.   MRN: [id number]  Information Source: Information source: Patient  Current Stressors:  Patient states their primary concerns and needs for treatment are:: "I need medication management, CST and counseling" Patient states their goals for this hospitilization and ongoing recovery are:: "Connection to community resources" Educational / Learning stressors: None reported Employment / Job issues: None reported Family Relationships: Stressful relationship with wife Museum/gallery curator / Lack of resources (include bankruptcy): None reported Housing / Lack of housing: Plans to leave his home and move to [place] Physical health (include injuries & life threatening diseases): Stage 3 pancreatic cancer Social relationships: Pt says he gets along with others Substance abuse: Pt says he smokes marijuana when he is around people who have it but does not purchase it. No alcohol use reported. Bereavement / Loss: Mother transitioned in October 2019, unable to attend service due to being incarcerated  Living/Environment/Situation:  Living Arrangements: Spouse/significant other Living conditions (as described by patient or guardian): "Toxic" Who else lives in the home?: Wife, step daughter How long has patient lived in current situation?: None reported What is atmosphere in current home: Chaotic  Family History:  Marital status: Married Number of Years Married: (Married in July 2019) What types of issues is patient dealing with in the relationship?: Pt describes his wife as being "toxic and controlling" Additional relationship information: Pt says he married his wife during a manic phase and says he wishes he had not done it Are you sexually active?: (None reported) What is your sexual orientation?: Heterosexual Has your sexual activity been affected by drugs, alcohol, medication, or emotional stress?: None  reported Does patient have children?: Yes How many children?: 3 How is patient's relationship with their children?: Pt says he has 3 daughters.  Childhood History:  By whom was/is the patient raised?: Mother, Grandparents Additional childhood history information: Mother's only child; describes father as "rolling stone" unsure if he has siblings on his side of family. Description of patient's relationship with caregiver when they were a child: Pt reports his mother was mentally ill and did not always live in the safest environment Patient's description of current relationship with people who raised him/her: Mother is deceased How were you disciplined when you got in trouble as a child/adolescent?: "ass whoopings" Does patient have siblings?: No Number of Siblings: 0 Description of patient's current relationship with siblings: NA Did patient suffer any verbal/emotional/physical/sexual abuse as a child?: No Did patient suffer from severe childhood neglect?: No Has patient ever been sexually abused/assaulted/raped as an adolescent or adult?: No Was the patient ever a victim of a crime or a disaster?: No Witnessed domestic violence?: No Has patient been effected by domestic violence as an adult?: No  Education:  Highest grade of school patient has completed: GED Currently a student?: No Learning disability?: No  Employment/Work Situation:   Employment situation: On disability Why is patient on disability:  physical health concerns How long has patient been on disability: Since July 2019 Patient's job has been impacted by current illness: No What is the longest time patient has a held a job?: None reported; 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 Where was the patient employed at that time?: None reported Did You Receive Any Psychiatric Treatment/Services While in the [employer]?: No Are There Guns or Other Weapons in [place]?: No Are These Weapons Safely Secured?: (Denies access to guns or  weapons)  Pensions consultant:   Museum/gallery curator  resources: [employer], [employer], Medicaid Does patient have a representative payee or guardian?: No  Alcohol/Substance Abuse:   What has been your use of drugs/alcohol within the last 12 months?: Pt reports smoking marijuana; denies alcohol use If attempted suicide, did drugs/alcohol play a role in this?: No Alcohol/Substance Abuse Treatment Hx: Denies past history If yes, describe treatment: NA Has alcohol/substance abuse ever caused legal problems?: No  Social Support System:   Patient's Community Support System: Good Describe Community Support System: Children, cousin Type of faith/religion: Darrick Meigs How does patient's faith help to cope with current illness?: Prayer, meditation, read bible  Leisure/Recreation:   Leisure and Hobbies: music, bowling, skating  Strengths/Needs:   What is the patient's perception of their strengths?: "I think I'm intelligent and I adapt to situations" Patient states they can use these personal strengths during their treatment to contribute to their recovery: "I think I'm intelligent and I adapt to situations" Patient states these barriers may affect/interfere with their treatment: None reported Patient states these barriers may affect their return to the community: None reported Other important information patient would like considered in planning for their treatment: None reported  Discharge Plan:   Currently receiving community mental health services: No(Pt says he previously received services at community health in [place]) Patient states concerns and preferences for aftercare planning are: Pt says he would like to be referred for community support team, prefers not to go to rha. Patient states they will know when they are safe and ready for discharge when: "I'm not suppose to be here, I'm ready now" Does patient have access to transportation?: Yes Does patient have financial barriers related  to discharge medications?: No Patient description of barriers related to discharge medications: None Will patient be returning to same living situation after discharge?: No  Summary/Recommendations:   Summary and Recommendations (to be completed by the evaluator): Patient is 43 yr old [place] American male who says he was IVC'd by his wife. According to pt, he does not need to be in the hospital, his wife does. Pt says he suspects his wife has mental health issues and during a recent altercation made references to them being biblical characters. Pt reports having pending legal charges(larceny, unauthorized credit and debit card use), which he says he received during a manic phase. Pt states he married his wife during a manic phase and regrets doing so. Pt reports having stage 3 pancreatic cancer and being treated at [facility]. Pt reports marijuana use; denies alcohol use. While here, patient will benefit from crisis stabilization, medication evaluation, group therapy and psychoeducation. In addition, it is recommended that patient remain compliant with the established discharge plan and continue treatment.   Charis Juliana Lynelle Smoke. 05/20/2018

## 2018-05-20 NOTE — Progress Notes (Signed)
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 Patient's thoughts are disorganized, speech is pressured and tangential he expressed anger over his involuntarily commitment  by his wife, and he stated " I don't want to see that woman she is not allowed to see me" he was very angry and agitated as he explained his frustration with his wife. patient has stage 3 pancreatic cancer and he is placed on pancreatic enzymes TID before meals. MDon call notified that patient needs additional dose during snack. Md changed order to qid before meals and snack time.  15 minutes safety checks maintained will continue to monitor.  Marland Kitchen

## 2018-05-20 NOTE — Plan of Care (Signed)
  Problem: Education: Goal: Will be free of psychotic symptoms Outcome: Progressing  Patient appears less psychotic  

## 2018-05-20 NOTE — Progress Notes (Signed)
D: Patient stated slept good last night .Stated appetite  good and energy level  normal. Stated concentration  good . Stated on Depression scale 0 , hopeless 0 and anxiety 0 .( low 0-10 high) Denies suicidal  homicidal ideations  .  No auditory hallucinations  No pain concerns . Appropriate ADL'S. Interacting with peers and staff.  A: Encourage patient participation with unit programming . Instruction  Given on  Medication , verbalize understanding.Able to verbalize understanding of information received . Emotional and mental status improved. Attending unit programing  able to verbalize feeling Interaction with peers . Voice no concerns around sleep .  Working on Radiographer, therapeutic . No anger outburst or control issues No safety concerns  patient remain manic . Compliant  with medication Able to vent positive feeling of self  able to communicate his needs correctly  R: Voice no other concerns. Staff continue to monitor

## 2018-05-20 NOTE — Plan of Care (Signed)
Able to verbalize understanding of information received . Emotional and mental status improved. Attending unit programing  able to verbalize feeling Interaction with peers . Voice no concerns around sleep .  Working on Radiographer, therapeutic . No anger outburst or control issues No safety concerns  patient remain manic . Compliant  with medication Able to vent positive feeling of self  able to communicate his needs correctly  Problem: Role Relationship: Goal: Ability to communicate needs accurately will improve Outcome: Progressing   Problem: Health Behavior/Discharge Planning: Goal: Compliance with prescribed medication regimen will improve Outcome: Progressing   Problem: Coping: Goal: Will verbalize feelings Outcome: Progressing   Problem: Coping: Goal: Coping ability will improve Outcome: Progressing   Problem: Health Behavior/Discharge Planning: Goal: Compliance with treatment plan for underlying cause of condition will improve Outcome: Progressing   Problem: Coping: Goal: Ability to demonstrate self-control will improve Outcome: Progressing   Problem: Coping: Goal: Ability to verbalize frustrations and anger appropriately will improve Outcome: Progressing   Problem: Education: Goal: Knowledge of [facility] Education information/materials will improve Outcome: Progressing    Problem: Self-Concept: Goal: Will verbalize positive feelings about self Outcome: Progressing   Problem: Role Relationship: Goal: Ability to interact with others will improve Outcome: Progressing

## 2018-05-20 NOTE — Progress Notes (Signed)
Pastoral Care Visit    05/20/18 1126  Clinical Encounter Type  Visited With Patient  Visit Type Follow-up;Psychological support;Behavioral Health  Referral From Nurse  Consult/Referral To Chaplain  Spiritual Encounters  Spiritual Needs Emotional  Stress Factors  Patient Stress Factors Loss;Health changes   Chap responded to RN request. Pt shared his story and presented with distressing thoughts regarding wife and feelings of resentment for her reactions which led to his incarceration and being in hosp. Pt also shared his frustration over not receiving chemo due to being in a manic state.  Pt reports having stage 3 pancreatic cancer which has not metastasized to liver.  Pt is socially ept and seems to have strong relationship with other pts.  Pt has grandiose thinking.  Pt rqsts non KJV Bible - none available in [place] office. Chap listened to pt story and had to depart quickly due to an emergency call.  Darcey Nora, Chaplain

## 2018-05-20 NOTE — Progress Notes (Signed)
Recreation Therapy Notes   Date: 05/20/2018  Time: 9:30 am  Location: Craft Room  Behavioral response: Appropriate  Intervention Topic: Communication  Discussion/Intervention:  Group content today was focused on communication. The group defined communication and ways to communicate with others. Individuals stated reason why communication is important and some reasons to communicate with others. Patients expressed if they thought they were good at communicating with others and ways they could improve their communication skills. The group identified important parts of communication and some experiences they have had in the past with communication. The group participated in the intervention "What is that?", where they had a chance to test out their communication skills and identify ways to improve their communication techniques.  Clinical Observations/Feedback:  Patient came to group late stating his communication skills area a 10 out of 10. He explained that communicating with others are healthy.  Individual was social with peers and staff while participating in the intervention. Zara Wendt LRT/CTRS         Arlanda Shiplett 05/20/2018 10:53 AM

## 2018-05-20 NOTE — Progress Notes (Addendum)
Inpatient Diabetes Program Recommendations  AACE/ADA: New Consensus Statement on Inpatient Glycemic Control (2015)  Target Ranges:  Prepandial:   less than 140 mg/dL      Peak postprandial:   less than 180 mg/dL (1-2 hours)      Critically ill patients:  140 - 180 mg/dL   Results for Travis Hawkins, Travis Hawkins (MRN [id number]) as of 05/20/2018 10:34  Ref. Range 05/19/2018 07:35 05/19/2018 10:20 05/19/2018 11:45 05/19/2018 15:58 05/19/2018 16:49 05/19/2018 22:23  Glucose-Capillary Latest Ref Range: 70 - 99 mg/dL 125 (H)  5 units NOVOLOG  196 (H)  6 units NOVOLOG  194 (H) 99 137 (H) 205 (H)   Results for Travis Hawkins, Travis Hawkins (MRN [id number]) as of 05/20/2018 10:34  Ref. Range 05/20/2018 07:13  Glucose-Capillary Latest Ref Range: 70 - 99 mg/dL 200 (H)    To ED: IVC due to disturbance with wife (per ED MD notes: "IVC paperwork indicates the patient was belligerent, threatening to kill his wife, and seem to be having racing/fleeting thoughts")  History: DM, Pancreatic Cancer  Home DM Meds: Humalog 8 units TID with meals                             Humalog SSI                             (see notes from [place] from PCP visit 05/08/2018)  Current Orders: CBGs tid ac + hs      MD- Note patient was receiving Novolog SSI and Novolog 4 units TID (meal coverage) yesterday down in the ED.  Please consider re-ordering the following:  1. Novolog Sensitive Correction Scale/ SSI (0-9 units) TID AC + HS  2. Novolog 4 units TID with meals  (Please add the following Hold Parameters: Hold if pt eats <50% of meal, Hold if pt NPO)     --Will follow patient during hospitalization--  Wyn Quaker RN, MSN, CDE Diabetes Coordinator Inpatient Glycemic Control Team Team Pager: [phone] (8a-5p)

## 2018-05-20 NOTE — H&P (Signed)
Psychiatric Admission Assessment Adult  Patient Identification: Travis Hawkins MRN:  [id number] Date of Evaluation:  05/20/2018 Chief Complaint:  manic behavior Principal Diagnosis: Bipolar 1 disorder, manic, moderate ([place]) Diagnosis:  Principal Problem:   Bipolar 1 disorder, manic, moderate (HCC) Active Problems:   Malignant tumor of head of pancreas ([place])   Diabetes mellitus ([place])   Chronic pancreatitis ([place])  History of Present Illness: Patient with a history of bipolar disorder presented to the emergency room with symptoms of mania.  Patient was agitated and threatening with bizarre and delusional grandiose sort of thoughts towards his wife.  He had not been sleeping well, he had been agitated and hyperactive recently.  This on top of his just recently having gotten out of prison where he was for several months after stealing a car during the midst of his episode of mania.  Patient's wife could not tolerate his threatening and agitated behavior and initiated his hospitalization.  Patient continues to be resentful and angry towards her although speaking to the wife it sounds like she is doing this and all the best judgment.  Patient not abusing alcohol or drugs.  Had been taking Zyprexa which had been prescribed for his mania.  Major life stress is not only the recent imprisonment but his diagnosis with advanced pancreatic cancer. Associated Signs/Symptoms: Depression Symptoms:  None (Hypo) Manic Symptoms:  Delusions, Distractibility, Elevated Mood, Flight of Ideas, Grandiosity, Impulsivity, Irritable Mood, Labiality of Mood, Anxiety Symptoms:  Excessive Worry, Psychotic Symptoms:  Delusions, Paranoia, PTSD Symptoms: Negative Total Time spent with patient: 1 hour  Past Psychiatric History: Patient has a history of bipolar disorder having had a manic episode in his 85s.  In the years since then he was largely stable although it sounds like he may have had some less severe episodes of  depression.  Patient's mania returned in the last year pretty much when he was diagnosed with pancreatic cancer.  He has been hospitalized at least once in this past year but has not yet come out of this mania.  Is the patient at risk to self? Yes.    Has the patient been a risk to self in the past 6 months? Yes.    Has the patient been a risk to self within the distant past? Yes.    Is the patient a risk to others? Yes.    Has the patient been a risk to others in the past 6 months? Yes.    Has the patient been a risk to others within the distant past? Yes.     Prior Inpatient Therapy:   Prior Outpatient Therapy:    Alcohol Screening: 1. How often do you have a drink containing alcohol?: Never 2. How many drinks containing alcohol do you have on a typical day when you are drinking?: 1 or 2 3. How often do you have six or more drinks on one occasion?: Never AUDIT-C Score: 0 4. How often during the last year have you found that you were not able to stop drinking once you had started?: Never 5. How often during the last year have you failed to do what was normally expected from you becasue of drinking?: Never 6. How often during the last year have you needed a first drink in the morning to get yourself going after a heavy drinking session?: Never 7. How often during the last year have you had a feeling of guilt of remorse after drinking?: Never 8. How often during the last year have  you been unable to remember what happened the night before because you had been drinking?: Never 9. Have you or someone else been injured as a result of your drinking?: No 10. Has a relative or friend or a doctor or another health worker been concerned about your drinking or suggested you cut down?: No Alcohol Use Disorder Identification Test Final Score (AUDIT): 0 Intervention/Follow-up: AUDIT Score <7 follow-up not indicated Substance Abuse History in the last 12 months:  No. Consequences of Substance  Abuse: Negative Previous Psychotropic Medications: Yes  Psychological Evaluations: Yes  Past Medical History:  Past Medical History:  Diagnosis Date  . Biliary obstruction   . Depression   . Mood disorder [facility])     Past Surgical History:  Procedure Laterality Date  . CHOLECYSTECTOMY    . ENDOSCOPIC RETROGRADE CHOLANGIOPANCREATOGRAPHY (ERCP) WITH PROPOFOL N/A 03/11/2017   Procedure: ENDOSCOPIC RETROGRADE CHOLANGIOPANCREATOGRAPHY (ERCP) WITH PROPOFOL;  Surgeon: Lucilla Lame, MD;  Location: [facility] ENDOSCOPY;  Service: Endoscopy;  Laterality: N/A;  . ERCP N/A 03/19/2017   Procedure: ENDOSCOPIC RETROGRADE CHOLANGIOPANCREATOGRAPHY (ERCP);  Surgeon: Lucilla Lame, MD;  Location: [facility] ENDOSCOPY;  Service: Endoscopy;  Laterality: N/A;  . ERCP N/A 06/25/2017   Procedure: ENDOSCOPIC RETROGRADE CHOLANGIOPANCREATOGRAPHY (ERCP);  Surgeon: Lucilla Lame, MD;  Location: [facility] ENDOSCOPY;  Service: Endoscopy;  Laterality: N/A;  . ERCP N/A 07/16/2017   Procedure: ENDOSCOPIC RETROGRADE CHOLANGIOPANCREATOGRAPHY (ERCP);  Surgeon: Lucilla Lame, MD;  Location: [facility] ENDOSCOPY;  Service: Endoscopy;  Laterality: N/A;  . none     Family History:  Family History  Problem Relation Age of Onset  . Cancer Maternal Aunt   . Breast cancer Maternal Grandmother   . Lung cancer Maternal Grandfather   . Diabetes Mellitus II Mother    Family Psychiatric  History: None Tobacco Screening: Have you used any form of tobacco in the last 30 days? (Cigarettes, Smokeless Tobacco, Cigars, and/or Pipes): Yes Tobacco use, Select all that apply: cigar use daily Are you interested in Tobacco Cessation Medications?: Yes, will notify MD for an order Counseled patient on smoking cessation including recognizing danger situations, developing coping skills and basic information about quitting provided: Yes Social History:  Social History   Substance and Sexual Activity  Alcohol Use No  . Frequency: Never   Comment: Occasional wine about  every 3-4 months     Social History   Substance and Sexual Activity  Drug Use No    Additional Social History: Marital status: Married Number of Years Married: (Married in July 2019) What types of issues is patient dealing with in the relationship?: Pt describes his wife as being "toxic and controlling" Additional relationship information: Pt says he married his wife during a manic phase and says he wishes he had not done it Are you sexually active?: (None reported) What is your sexual orientation?: Heterosexual Has your sexual activity been affected by drugs, alcohol, medication, or emotional stress?: None reported Does patient have children?: Yes How many children?: 3 How is patient's relationship with their children?: Pt says he has 3 daughters.                         Allergies:   Allergies  Allergen Reactions  . Benztropine Other (See Comments)    Tongue swelling   . Lamotrigine Other (See Comments)    DILI 2018   Lab Results:  Results for orders placed or performed during the hospital encounter of 05/19/18 (from the past 48 hour(s))  Glucose, capillary  Status: Abnormal   Collection Time: 05/19/18  4:49 PM  Result Value Ref Range   Glucose-Capillary 137 (H) 70 - 99 mg/dL  Hemoglobin A1c     Status: Abnormal   Collection Time: 05/19/18  6:01 PM  Result Value Ref Range   Hgb A1c MFr Bld 6.4 (H) 4.8 - 5.6 %    Comment: (NOTE) Pre diabetes:          5.7%-6.4% Diabetes:              >6.4% Glycemic control for   <7.0% adults with diabetes    Mean Plasma Glucose 136.98 mg/dL    Comment: Performed at [facility] [address], [place], [place] [address]  Glucose, capillary     Status: Abnormal   Collection Time: 05/19/18 10:23 PM  Result Value Ref Range   Glucose-Capillary 205 (H) 70 - 99 mg/dL   Comment 1 Notify RN   Glucose, capillary     Status: Abnormal   Collection Time: 05/20/18  7:13 AM  Result Value Ref Range   Glucose-Capillary 200  (H) 70 - 99 mg/dL   Comment 1 Notify RN   Glucose, capillary     Status: Abnormal   Collection Time: 05/20/18 11:48 AM  Result Value Ref Range   Glucose-Capillary 151 (H) 70 - 99 mg/dL    Blood Alcohol level:  Lab Results  Component Value Date   ETH <10 51/88/4166    Metabolic Disorder Labs:  Lab Results  Component Value Date   HGBA1C 6.4 (H) 05/19/2018   MPG 136.98 05/19/2018   MPG 116.89 03/11/2017   No results found for: PROLACTIN Lab Results  Component Value Date   CHOL 229 (H) 03/15/2017   TRIG 451 (H) 08/12/2017   HDL <10 (L) 03/15/2017   CHOLHDL NOT CALCULATED 03/15/2017   VLDL UNABLE TO CALCULATE IF TRIGLYCERIDE OVER 400 mg/dL 03/15/2017   LDLCALC UNABLE TO CALCULATE IF TRIGLYCERIDE OVER 400 mg/dL 03/15/2017    Current Medications: Current Facility-Administered Medications  Medication Dose Route Frequency Provider Last Rate Last Dose  . acetaminophen (TYLENOL) tablet 650 mg  650 mg Oral Q6H PRN Lavella Hammock, MD      . alum & mag hydroxide-simeth (MAALOX/MYLANTA) 200-200-20 MG/5ML suspension 30 mL  30 mL Oral Q4H PRN Lavella Hammock, MD      . amoxicillin (AMOXIL) capsule 1,000 mg  1,000 mg Oral Q12H Lavella Hammock, MD   1,000 mg at 05/20/18 0810  . bisacodyl (DULCOLAX) EC tablet 5 mg  5 mg Oral Daily PRN Lavella Hammock, MD      . clarithromycin Binnie Rail) tablet 500 mg  500 mg Oral Q12H Lavella Hammock, MD   500 mg at 05/20/18 0810  . HYDROcodone-acetaminophen (NORCO/VICODIN) 5-325 MG per tablet 1 tablet  1 tablet Oral Q4H PRN Lavella Hammock, MD   1 tablet at 05/20/18 1401  . HYDROmorphone (DILAUDID) tablet 4 mg  4 mg Oral Q4H PRN Jemina Scahill T, MD      . lipase/protease/amylase (CREON) capsule 36,000 Units  36,000 Units Oral QID Pucilowska, Jolanta B, MD   36,000 Units at 05/20/18 1146  . lithium carbonate (LITHOBID) CR tablet 600 mg  600 mg Oral QHS Eboni Coval T, MD   600 mg at 05/19/18 2152  . loratadine (CLARITIN) tablet 10 mg  10 mg Oral Daily  Lavella Hammock, MD   10 mg at 05/20/18 0810  . LORazepam (ATIVAN) tablet 0.5 mg  0.5  mg Oral QHS PRN Lavella Hammock, MD   0.5 mg at 05/19/18 2153  . magnesium hydroxide (MILK OF MAGNESIA) suspension 30 mL  30 mL Oral Daily PRN Lavella Hammock, MD      . nicotine (NICODERM CQ - dosed in mg/24 hours) patch 14 mg  14 mg Transdermal Daily Lavella Hammock, MD   14 mg at 05/20/18 0813  . OLANZapine (ZYPREXA) tablet 15 mg  15 mg Oral QHS Lavella Hammock, MD   15 mg at 05/19/18 2151  . protein supplement (PREMIER PROTEIN) liquid - approved for s/p bariatric surgery  11 oz Oral BID BM Lavella Hammock, MD      . senna [facility]) tablet 8.6 mg  1 tablet Oral QHS Lavella Hammock, MD   8.6 mg at 05/19/18 2151   PTA Medications: Medications Prior to Admission  Medication Sig Dispense Refill Last Dose  . acetaminophen (TYLENOL) 325 MG tablet Take 2 tablets (650 mg total) by mouth every 6 (six) hours as needed for mild pain (or Fever >/= 101).   prn at prn  . amoxicillin (AMOXIL) 500 MG tablet Take 1,000 mg by mouth every 12 (twelve) hours.   Completed Course at Unknown time  . bisacodyl (DULCOLAX) 5 MG EC tablet Take 1 tablet (5 mg total) by mouth daily as needed for moderate constipation. (Patient not taking: Reported on 05/17/2018) 30 tablet 0 Not Taking at Unknown time  . clarithromycin (BIAXIN) 500 MG tablet Take 500 mg by mouth every 12 (twelve) hours.   Completed Course at Unknown time  . colestipol (COLESTID) 1 g tablet Take 1 g by mouth every 6 (six) hours.   Past Week at Unknown time  . ferrous sulfate 324 (65 Fe) MG TBEC Take 324 mg by mouth daily with breakfast.   Past Week at Unknown time  . gabapentin (NEURONTIN) 100 MG capsule Take 100 mg by mouth 2 (two) times daily.   Past Week at Unknown time  . HYDROcodone-acetaminophen (NORCO/VICODIN) 5-325 MG tablet Take 1 tablet by mouth every 4 (four) hours as needed for moderate pain. 30 tablet 0 prn at prn  . HYDROmorphone (DILAUDID) 4 MG tablet  Take 4 mg by mouth every 4 (four) hours as needed for severe pain.   Past Week at prn  . insulin lispro (HUMALOG KWIKPEN) 100 UNIT/ML KwikPen Inject 8 Units into the skin 3 (three) times daily with meals.   Past Week at Unknown time  . loratadine (CLARITIN) 10 MG tablet Take 1 tablet (10 mg total) by mouth daily. (Patient not taking: Reported on 05/17/2018) 30 tablet 0 Not Taking at Unknown time  . Melatonin 3 MG TABS Take 3 mg by mouth Nightly.   Past Week at Unknown time  . nicotine (NICODERM CQ - DOSED IN MG/24 HOURS) 14 mg/24hr patch Place 1 patch (14 mg total) onto the skin daily. (Patient not taking: Reported on 05/17/2018) 28 patch 0 Not Taking at Unknown time  . OLANZapine (ZYPREXA) 10 MG tablet Take 10 mg by mouth at bedtime.    Past Week at Unknown time  . oxyCODONE (OXYCONTIN) 10 mg 12 hr tablet Take 1 tablet (10 mg total) by mouth every 12 (twelve) hours. 20 tablet 0 Past Week at Unknown time  . Pancrelipase, Lip-Prot-Amyl, 24000-76000 units CPEP Take 4 capsules with first bite of meals and 3 capsules with snacks   Past Week at Unknown time  . pantoprazole (PROTONIX) 40 MG tablet Take 40 mg by mouth  every 12 (twelve) hours.   Past Week at Unknown time  . protein supplement shake (PREMIER PROTEIN) LIQD Take 325 mLs (11 oz total) by mouth 2 (two) times daily between meals. (Patient not taking: Reported on 05/17/2018) 60 Can 0 Not Taking at Unknown time  . senna (SENOKOT) 8.6 MG TABS tablet Take 1 tablet (8.6 mg total) by mouth at bedtime. 120 each 0 Past Week at Unknown time  . traZODone (DESYREL) 100 MG tablet Take 200 mg by mouth Nightly.   Past Week at Unknown time    Musculoskeletal: Strength & Muscle Tone: within normal limits Gait & Station: normal Patient leans: N/A  Psychiatric Specialty Exam: Physical Exam  Nursing note and vitals reviewed. Constitutional: He appears well-developed and well-nourished.  HENT:  Head: Normocephalic and atraumatic.  Eyes: Pupils are equal, round,  and reactive to light. Conjunctivae are normal.  Neck: Normal range of motion.  Cardiovascular: Regular rhythm and normal heart sounds.  Respiratory: Effort normal. No respiratory distress.  GI: Soft.  Musculoskeletal: Normal range of motion.  Neurological: He is alert.  Skin: Skin is warm and dry.  Psychiatric: His affect is labile and inappropriate. His speech is tangential. He is agitated. He is not aggressive. Thought content is paranoid. Cognition and memory are impaired. He expresses impulsivity. He expresses no homicidal and no suicidal ideation.    Review of Systems  Constitutional: Negative.   HENT: Negative.   Eyes: Negative.   Respiratory: Negative.   Cardiovascular: Negative.   Gastrointestinal: Negative.   Musculoskeletal: Negative.   Skin: Negative.   Neurological: Negative.   Psychiatric/Behavioral: Negative.     Blood pressure 111/83, pulse 94, temperature 97.6 F (36.4 C), temperature source Oral, resp. rate 18, height 5\' 8"  (1.727 m), weight 67.1 kg, SpO2 100 %.Body mass index is 22.5 kg/m.  General Appearance: Fairly Groomed  Eye Contact:  Good  Speech:  Pressured  Volume:  Increased  Mood:  Euphoric  Affect:  Labile  Thought Process:  Disorganized  Orientation:  Full (Time, Place, and Person)  Thought Content:  Illogical  Suicidal Thoughts:  No  Homicidal Thoughts:  No  Memory:  Immediate;   Fair Recent;   Fair Remote;   Fair  Judgement:  Fair  Insight:  Fair  Psychomotor Activity:  Increased  Concentration:  Concentration: Fair  Recall:  [place] of Knowledge:  Good  Language:  Good  Akathisia:  No  Handed:  Right  AIMS (if indicated):     Assets:  Desire for Improvement Social Support  ADL's:  Intact  Cognition:  WNL  Sleep:  Number of Hours: 5    Treatment Plan Summary: Daily contact with patient to assess and evaluate symptoms and progress in treatment, Medication management and Plan Patient continues to show signs of mania or  hypomania.  This could be considered hypomania except that I think he is exerting control to keep it under wraps and that outside around others especially his family he remains at high risk for dangerous behavior as he has exhibited several times this past year.  Patient has the advantage that he has good insight and acknowledges the risks of bipolar disorder and is currently cooperative with treatment.  For now continue 15-minute checks.  Continue IVC.  I have started lithium in addition to his Zyprexa.  Patient is agreeable to that.  He will be involved in groups and activities.  We will have to see how much improvement we can get before considering discharge  and then work on appropriate disposition plan.  Patient has pancreatic cancer and had been receiving palliative chemotherapy.  As documented in the recent note from the emergency room his oncologist at [facility] has stated that he would prefer the patient get his mental illness under good control before continuing with chemotherapy so we will put that on the back burner for now.  Continue aggressive treatment for his diabetes and pancreatitis.  Also continue pain management for the pain from his illness.  Observation Level/Precautions:  15 minute checks  Laboratory:  HbAIC  Psychotherapy:    Medications:    Consultations:    Discharge Concerns:    Estimated LOS:  Other:     Physician Treatment Plan for Primary Diagnosis: Bipolar 1 disorder, manic, moderate ([place]) Long Term Goal(s): Improvement in symptoms so as ready for discharge  Short Term Goals: Ability to verbalize feelings will improve, Ability to demonstrate self-control will improve and Ability to identify and develop effective coping behaviors will improve  Physician Treatment Plan for Secondary Diagnosis: Principal Problem:   Bipolar 1 disorder, manic, moderate (HCC) Active Problems:   Malignant tumor of head of pancreas ([place])   Diabetes mellitus ([place])   Chronic pancreatitis  ([place])  Long Term Goal(s): Improvement in symptoms so as ready for discharge  Short Term Goals: Compliance with prescribed medications will improve  I certify that inpatient services furnished can reasonably be expected to improve the patient's condition.    Alethia Berthold, MD 1/21/20203:26 PM

## 2018-05-20 NOTE — [facility] Suicide Risk Assessment (Signed)
[facility] Admission Suicide Risk Assessment   Nursing information obtained from:  Patient, Review of record Demographic factors:  Male, Unemployed Current Mental Status:  NA(Denies) Loss Factors:  Decline in physical health, Loss of significant relationship, Financial problems / change in socioeconomic status Historical Factors:  NA(Denies) Risk Reduction Factors:  Positive coping skills or problem solving skills, Responsible for children under 17 years of age, Sense of responsibility to family, Religious beliefs about death  Total Time spent with patient: 1 hour Principal Problem: <principal problem not specified> Diagnosis:  Active Problems:   Bipolar 1 disorder, manic, moderate (HCC)  Subjective Data: Patient admitted to the hospital because of ongoing mania.  Patient interviewed chart reviewed.  Patient had not had any recent suicidal behavior.  On interview the patient reports his mood is extremely good.  He appears to have multiple symptoms of euphoric mania.  He denies any suicidal thoughts as well.  He is not threatening or aggressive to other people although he has been impulsive and agitated at times recently.  Patient currently does not appear to be in acute suicidal threat although the diagnosis and history makes that a chronic concern.  Continued Clinical Symptoms:  Alcohol Use Disorder Identification Test Final Score (AUDIT): 0 The "Alcohol Use Disorders Identification Test", Guidelines for Use in Primary Care, Second Edition.  World Pharmacologist [facility]). Score between 0-7:  no or low risk or alcohol related problems. Score between 8-15:  moderate risk of alcohol related problems. Score between 16-19:  high risk of alcohol related problems. Score 20 or above:  warrants further diagnostic evaluation for alcohol dependence and treatment.   CLINICAL FACTORS:   Bipolar Disorder:   Mixed State   Musculoskeletal: Strength & Muscle Tone: within normal limits Gait & Station:  normal Patient leans: N/A  Psychiatric Specialty Exam: Physical Exam  Nursing note and vitals reviewed. Constitutional: He appears well-developed and well-nourished.  HENT:  Head: Normocephalic and atraumatic.  Eyes: Pupils are equal, round, and reactive to light. Conjunctivae are normal.  Neck: Normal range of motion.  Cardiovascular: Regular rhythm and normal heart sounds.  Respiratory: Effort normal.  GI: Soft.  Musculoskeletal: Normal range of motion.  Neurological: He is alert.  Skin: Skin is warm and dry.  Psychiatric: His affect is labile. His speech is tangential. He is agitated. He is not aggressive. Cognition and memory are impaired. He expresses impulsivity. He expresses no homicidal and no suicidal ideation.    Review of Systems  Constitutional: Negative.   HENT: Negative.   Eyes: Negative.   Respiratory: Negative.   Cardiovascular: Negative.   Gastrointestinal: Negative.   Musculoskeletal: Negative.   Skin: Negative.   Neurological: Negative.   Psychiatric/Behavioral: Negative for depression, hallucinations, memory loss, substance abuse and suicidal ideas. The patient is nervous/anxious and has insomnia.     Blood pressure 111/83, pulse 94, temperature 97.6 F (36.4 C), temperature source Oral, resp. rate 18, height 5\' 8"  (1.727 m), weight 67.1 kg, SpO2 100 %.Body mass index is 22.5 kg/m.  General Appearance: Fairly Groomed  Eye Contact:  Good  Speech:  Clear and Coherent and Pressured  Volume:  Increased  Mood:  Euphoric  Affect:  Congruent  Thought Process:  Disorganized  Orientation:  Full (Time, Place, and Person)  Thought Content:  Illogical, Paranoid Ideation, Rumination and Tangential  Suicidal Thoughts:  No  Homicidal Thoughts:  No  Memory:  Immediate;   Fair Recent;   Fair Remote;   Fair  Judgement:  Impaired  Insight:  Shallow  Psychomotor Activity:  Increased  Concentration:  Concentration: Fair  Recall:  [place] of Knowledge:  Good   Language:  Good  Akathisia:  No  Handed:  Right  AIMS (if indicated):     Assets:  Communication Skills Desire for Improvement Resilience Social Support  ADL's:  Impaired  Cognition:  Impaired,  Mild  Sleep:  Number of Hours: 5      COGNITIVE FEATURES THAT CONTRIBUTE TO RISK:  Loss of executive function    SUICIDE RISK:   Minimal: No identifiable suicidal ideation.  Patients presenting with no risk factors but with morbid ruminations; may be classified as minimal risk based on the severity of the depressive symptoms  PLAN OF CARE: Patient currently does not appear to be acutely at risk to himself although his mania and disorganized thinking make him more impulsive and labile in the long-term prognosis includes suicidal possibility and bipolar disorder.  Patient will be continued to be treated for his illness and have ongoing assessment of dangerousness including suicidality prior to discharge  I certify that inpatient services furnished can reasonably be expected to improve the patient's condition.   Alethia Berthold, MD 05/20/2018, 3:22 PM

## 2018-05-20 NOTE — BHH Group Notes (Signed)
[place] Group Notes:  (Nursing/MHT/Case Management/Adjunct)  Date:  05/20/2018  Time:  10:27 PM  Type of Therapy:  Group Therapy  Participation Level:  Active  Participation Quality:  Appropriate  Affect:  Appropriate  Cognitive:  Appropriate  Insight:  Appropriate  Engagement in Group:  Engaged  Modes of Intervention:  Discussion  Summary of Progress/Problems: [name] stated his goal was to connect to an ACTT team. [name] stated he accomplished that goal. 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 [name] has reported he does not want his wife to know anything about his treatment. MHT informed patients of rules and expectations while on the unit. MHT informed patients they needed to provide their code to any one they wanted to be able to contact them. MHT informed patients of visitation hours and phone hours. MHT informed patients of to fill out snack sheet. MHT informed patients food was not allowed in the rooms. MHT informed patients there was no touching, hugging, or doing other patient's hair on the unit. MHT informed patients of routine checks and to cover appropriately throughout the night. MHT reminded patients not to give out personal information and to only use first names. Travis Hawkins 05/20/2018, 10:27 PM

## 2018-05-21 LAB — GLUCOSE, CAPILLARY
GLUCOSE-CAPILLARY: 174 mg/dL — AB (ref 70–99)
Glucose-Capillary: 146 mg/dL — ABNORMAL HIGH (ref 70–99)
Glucose-Capillary: 165 mg/dL — ABNORMAL HIGH (ref 70–99)
Glucose-Capillary: 151 mg/dL — ABNORMAL HIGH (ref 70–99)

## 2018-05-21 MED ORDER — LITHIUM CARBONATE ER 450 MG PO TBCR
900.0000 mg | EXTENDED_RELEASE_TABLET | Freq: Every day | ORAL | Status: DC
  Administered 2018-05-21 – 2018-05-22 (×2): 900 mg via ORAL
  Filled 2018-05-21 (×2): qty 2

## 2018-05-21 NOTE — Progress Notes (Signed)
[facility] MD Progress Note  05/21/2018 3:25 PM Travis Hawkins  MRN:  [id number] Subjective: Follow-up for this gentleman with bipolar disorder.  Patient himself says he is feeling good.  If 1 did not know that he had recently had mania you might not think there was anything out of the ordinary about his presentation.  He is upbeat and cheerful but was not pressured and was not physically agitated.  He seemed to be able to express appropriate goals when he was out of the hospital.  He continues to have some irritability towards his wife but not making any threats and does not get angry or get tangential when he talks about her.  He has no new physical complaints.  He has been tolerating the medicine without difficulty including the lithium. 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 Biliary obstruction   . Depression   . Mood disorder [facility])     Past Surgical History:  Procedure Laterality Date  . CHOLECYSTECTOMY    . ENDOSCOPIC RETROGRADE CHOLANGIOPANCREATOGRAPHY (ERCP) WITH PROPOFOL N/A 03/11/2017   Procedure: ENDOSCOPIC RETROGRADE CHOLANGIOPANCREATOGRAPHY (ERCP) WITH PROPOFOL;  Surgeon: Lucilla Lame, MD;  Location: [facility] ENDOSCOPY;  Service: Endoscopy;  Laterality: N/A;  . ERCP N/A 03/19/2017   Procedure: ENDOSCOPIC RETROGRADE CHOLANGIOPANCREATOGRAPHY (ERCP);  Surgeon: Lucilla Lame, MD;  Location: [facility] ENDOSCOPY;  Service: Endoscopy;  Laterality: N/A;  . ERCP N/A 06/25/2017   Procedure: ENDOSCOPIC RETROGRADE  CHOLANGIOPANCREATOGRAPHY (ERCP);  Surgeon: Lucilla Lame, MD;  Location: [facility] ENDOSCOPY;  Service: Endoscopy;  Laterality: N/A;  . ERCP N/A 07/16/2017   Procedure: ENDOSCOPIC RETROGRADE CHOLANGIOPANCREATOGRAPHY (ERCP);  Surgeon: Lucilla Lame, MD;  Location: [facility] ENDOSCOPY;  Service: Endoscopy;  Laterality: N/A;  . none     Family History:  Family History  Problem Relation Age of Onset  . Cancer Maternal Aunt   . Breast cancer Maternal Grandmother   . Lung cancer Maternal Grandfather   . Diabetes Mellitus II Mother    Family Psychiatric  History: None known Social History:  Social History   Substance and Sexual Activity  Alcohol Use No  . Frequency: Never   Comment: Occasional wine about every 3-4 months     Social History   Substance and Sexual Activity  Drug Use No    Social History   Socioeconomic History  . Marital status: Divorced    Spouse name: Not on file  . Number of children: Not on file  . Years of education: Not on file  . Highest education level: Not on file  Occupational History  . Not on file  Social Needs  . Financial resource strain: Not on file  . Food insecurity:    Worry: Not on file    Inability: Not on file  . Transportation needs:    Medical: Not on file    Non-medical: Not on file  Tobacco Use  . Smoking status: Former Smoker    Packs/day: 1.00    Years: 18.00    Pack years: 18.00    Types: E-cigarettes  .  Smokeless tobacco: Never Used  Substance and Sexual Activity  . Alcohol use: No    Frequency: Never    Comment: Occasional wine about every 3-4 months  . Drug use: No  . Sexual activity: Yes    Partners: Female  Lifestyle  . Physical activity:    Days per week: Not on file    Minutes per session: Not on file  . Stress: Not on file  Relationships  . Social connections:    Talks on phone: Not on file    Gets together: Not on file    Attends religious service: Not on file    Active member of club or organization: Not on file     Attends meetings of clubs or organizations: Not on file    Relationship status: Not on file  Other Topics Concern  . Not on file  Social History Narrative  . Not on file   Additional Social History:                         Sleep: Fair  Appetite:  Fair  Current Medications: Current Facility-Administered Medications  Medication Dose Route Frequency Provider Last Rate Last Dose  . acetaminophen (TYLENOL) tablet 650 mg  650 mg Oral Q6H PRN Lavella Hammock, MD      . alum & mag hydroxide-simeth (MAALOX/MYLANTA) 200-200-20 MG/5ML suspension 30 mL  30 mL Oral Q4H PRN Lavella Hammock, MD      . amoxicillin (AMOXIL) capsule 1,000 mg  1,000 mg Oral Q12H Lavella Hammock, MD   1,000 mg at 05/21/18 0815  . bisacodyl (DULCOLAX) EC tablet 5 mg  5 mg Oral Daily PRN Lavella Hammock, MD      . HYDROcodone-acetaminophen (NORCO/VICODIN) 5-325 MG per tablet 1 tablet  1 tablet Oral Q4H PRN Lavella Hammock, MD   1 tablet at 05/21/18 1357  . HYDROmorphone (DILAUDID) tablet 4 mg  4 mg Oral Q4H PRN Niki Payment T, MD      . lipase/protease/amylase (CREON) capsule 36,000 Units  36,000 Units Oral QID Pucilowska, Jolanta B, MD   36,000 Units at 05/21/18 1158  . lithium carbonate (ESKALITH) CR tablet 900 mg  900 mg Oral QHS Jahnya Trindade T, MD      . loratadine (CLARITIN) tablet 10 mg  10 mg Oral Daily Lavella Hammock, MD   10 mg at 05/21/18 0815  . LORazepam (ATIVAN) tablet 0.5 mg  0.5 mg Oral QHS PRN Lavella Hammock, MD   0.5 mg at 05/19/18 2153  . magnesium hydroxide (MILK OF MAGNESIA) suspension 30 mL  30 mL Oral Daily PRN Lavella Hammock, MD      . nicotine (NICODERM CQ - dosed in mg/24 hours) patch 14 mg  14 mg Transdermal Daily Lavella Hammock, MD   14 mg at 05/21/18 0818  . OLANZapine (ZYPREXA) tablet 15 mg  15 mg Oral QHS Lavella Hammock, MD   15 mg at 05/20/18 2131  . protein supplement (PREMIER PROTEIN) liquid - approved for s/p bariatric surgery  11 oz Oral BID BM Lavella Hammock,  MD      . senna [facility]) tablet 8.6 mg  1 tablet Oral QHS Lavella Hammock, MD   8.6 mg at 05/20/18 2131    Lab Results:  Results for orders placed or performed during the hospital encounter of 05/19/18 (from the past 48 hour(s))  Glucose, capillary     Status: Abnormal  Collection Time: 05/19/18  4:49 PM  Result Value Ref Range   Glucose-Capillary 137 (H) 70 - 99 mg/dL  Hemoglobin A1c     Status: Abnormal   Collection Time: 05/19/18  6:01 PM  Result Value Ref Range   Hgb A1c MFr Bld 6.4 (H) 4.8 - 5.6 %    Comment: (NOTE) Pre diabetes:          5.7%-6.4% Diabetes:              >6.4% Glycemic control for   <7.0% adults with diabetes    Mean Plasma Glucose 136.98 mg/dL    Comment: Performed at [facility] [address] [place], [place] [address]  Glucose, capillary     Status: Abnormal   Collection Time: 05/19/18 10:23 PM  Result Value Ref Range   Glucose-Capillary 205 (H) 70 - 99 mg/dL   Comment 1 Notify RN   Glucose, capillary     Status: Abnormal   Collection Time: 05/20/18  7:13 AM  Result Value Ref Range   Glucose-Capillary 200 (H) 70 - 99 mg/dL   Comment 1 Notify RN   Glucose, capillary     Status: Abnormal   Collection Time: 05/20/18 11:48 AM  Result Value Ref Range   Glucose-Capillary 151 (H) 70 - 99 mg/dL  Glucose, capillary     Status: Abnormal   Collection Time: 05/20/18  4:32 PM  Result Value Ref Range   Glucose-Capillary 129 (H) 70 - 99 mg/dL  Glucose, capillary     Status: Abnormal   Collection Time: 05/21/18  6:53 AM  Result Value Ref Range   Glucose-Capillary 146 (H) 70 - 99 mg/dL   Comment 1 Notify RN   Glucose, capillary     Status: Abnormal   Collection Time: 05/21/18 11:57 AM  Result Value Ref Range   Glucose-Capillary 165 (H) 70 - 99 mg/dL    Blood Alcohol level:  Lab Results  Component Value Date   ETH <10 62/95/2841    Metabolic Disorder Labs: Lab Results  Component Value Date   HGBA1C 6.4 (H) 05/19/2018   MPG 136.98  05/19/2018   MPG 116.89 03/11/2017   No results found for: PROLACTIN Lab Results  Component Value Date   CHOL 229 (H) 03/15/2017   TRIG 451 (H) 08/12/2017   HDL <10 (L) 03/15/2017   CHOLHDL NOT CALCULATED 03/15/2017   VLDL UNABLE TO CALCULATE IF TRIGLYCERIDE OVER 400 mg/dL 03/15/2017   LDLCALC UNABLE TO CALCULATE IF TRIGLYCERIDE OVER 400 mg/dL 03/15/2017    Physical Findings: AIMS:  , ,  ,  ,    CIWA:    COWS:     Musculoskeletal: Strength & Muscle Tone: within normal limits Gait & Station: normal Patient leans: N/A  Psychiatric Specialty Exam: Physical Exam  Nursing note and vitals reviewed. Constitutional: He appears well-developed and well-nourished.  HENT:  Head: Normocephalic and atraumatic.  Eyes: Pupils are equal, round, and reactive to light. Conjunctivae are normal.  Neck: Normal range of motion.  Cardiovascular: Regular rhythm and normal heart sounds.  Respiratory: Effort normal.  GI: Soft.  Musculoskeletal: Normal range of motion.  Neurological: He is alert.  Skin: Skin is warm and dry.  Psychiatric: He has a normal mood and affect. His speech is normal and behavior is normal. Judgment and thought content normal. Cognition and memory are normal.    Review of Systems  Constitutional: Negative.   HENT: Negative.   Eyes: Negative.   Respiratory: Negative.   Cardiovascular:  Negative.   Gastrointestinal: Negative.   Musculoskeletal: Negative.   Skin: Negative.   Neurological: Negative.   Psychiatric/Behavioral: Negative.     Blood pressure 132/78, pulse 89, temperature 98.5 F (36.9 C), temperature source Oral, resp. rate 18, height 5\' 8"  (1.727 m), weight 67.1 kg, SpO2 100 %.Body mass index is 22.5 kg/m.  General Appearance: Casual  Eye Contact:  Good  Speech:  Clear and Coherent  Volume:  Normal  Mood:  Euthymic  Affect:  Congruent  Thought Process:  Goal Directed  Orientation:  Full (Time, Place, and Person)  Thought Content:  Logical  Suicidal  Thoughts:  No  Homicidal Thoughts:  No  Memory:  Immediate;   Fair Recent;   Fair Remote;   Fair  Judgement:  Fair  Insight:  Fair  Psychomotor Activity:  Normal  Concentration:  Concentration: Fair  Recall:  [employer] of Knowledge:  Fair  Language:  Fair  Akathisia:  No  Handed:  Right  AIMS (if indicated):     Assets:  Communication Skills Desire for Improvement Resilience  ADL's:  Intact  Cognition:  WNL  Sleep:  Number of Hours: 6     Treatment Plan Summary: Daily contact with patient to assess and evaluate symptoms and progress in treatment, Medication management and Plan Patient appears to be significantly better.  He is upbeat but not in a manner that would seem bizarre.  His plans for the future seem appropriate rather than grandiose.  He is not reporting any suicidal or homicidal ideation.  His dress and hygiene are all completely normal and appropriate.  He is tolerating medication and agrees readily to outpatient treatment specifically seeing an act team through [facility].  Under the circumstances I think it is reasonable to discharge him.  We are going to plan for discharge on Friday.  One change I made to medicine today was to slightly increase the lithium to 900 mg as he has tolerated the 600 without difficulty.  Another was to discontinue his clarithromycin.  I was reading last night about antibiotic associated mania and that clarithromycin is actually a common cause of this so I am going to discontinue it since it was only for H. pylori infection.  No other change to medicine or treatment plan likely ready for discharge by Friday  Alethia Berthold, MD 05/21/2018, 3:25 PM

## 2018-05-21 NOTE — Progress Notes (Signed)
D: Patient stated slept good last night .Stated appetite is good and energy level  normal. Stated concentration  good . Stated on Depression scale 0 , hopeless 0 and anxiety 0 .( low 0-10 high) Denies suicidal  homicidal ideations  .  No auditory hallucinations  No pain concerns . Appropriate ADL'S. Interacting with peers and staff.   A: Encourage patient participation with unit programming . Instruction  Given on  Medication , verbalize understanding.Able to vent positive feeling of self able to communicate his needs correctly .Able to verbalize understanding of information received . Emotional and mental status improved. Attending unit programing able to verbalize feeling Interaction with peers . Voice no concerns around sleep . Working on Radiographer, therapeutic . No anger outburst or control issues No safety concerns patient remain manic . Compliant with medication.  R: Voice no other concerns. Staff continue to monitor

## 2018-05-21 NOTE — Progress Notes (Signed)
   05/21/18 1007  Clinical Encounter Type  Visited With Patient  Visit Type Follow-up  Referral From Nurse  Consult/Referral To Chaplain  Spiritual Encounters  Spiritual Needs Sacred text;Prayer;Emotional  [facility] received page to visit patient. Entered unit and met patient near activity room. Introduced self and went to patient's room where there was more privacy. Provided pastoral care through active/reflective listening, validation of emotions and by being a hopeful non-judgmental presence. Patient shared feelings and emotions freely. Built rapport through shared experiences. Recited patient's favorite scripture (Psalms 71) and had prayer. Patient shared his gratefulness for chaplain staff. Follow up visits are appreciated.

## 2018-05-21 NOTE — Plan of Care (Signed)
Able to vent positive feeling of self  able to communicate his needs correctly  Able to verbalize understanding of information received . Emotional and mental status improved. Attending unit programing  able to verbalize feeling Interaction with peers . Voice no concerns around sleep .  Working on Radiographer, therapeutic . No anger outburst or control issues No safety concerns  patient remain manic . Compliant  with medication.  Problem: Education: Goal: Knowledge of [facility] Education information/materials will improve Outcome: Progressing Goal: Emotional status will improve Outcome: Progressing Goal: Mental status will improve Outcome: Progressing Goal: Verbalization of understanding the information provided will improve Outcome: Progressing   Problem: Activity: Goal: Interest or engagement in activities will improve Outcome: Progressing Goal: Sleeping patterns will improve Outcome: Progressing   Problem: Coping: Goal: Ability to verbalize frustrations and anger appropriately will improve Outcome: Progressing Goal: Ability to demonstrate self-control will improve Outcome: Progressing   Problem: Health Behavior/Discharge Planning: Goal: Identification of resources available to assist in meeting health care needs will improve Outcome: Progressing Goal: Compliance with treatment plan for underlying cause of condition will improve Outcome: Progressing   Problem: Physical Regulation: Goal: Ability to maintain clinical measurements within normal limits will improve Outcome: Progressing   Problem: Safety: Goal: Periods of time without injury will increase Outcome: Progressing   Problem: Education: Goal: Will be free of psychotic symptoms Outcome: Progressing Goal: Knowledge of the prescribed therapeutic regimen will improve Outcome: Progressing   Problem: Coping: Goal: Coping ability will improve Outcome: Progressing Goal: Will verbalize feelings Outcome: Progressing    Problem: Health Behavior/Discharge Planning: Goal: Compliance with prescribed medication regimen will improve Outcome: Progressing   Problem: Role Relationship: Goal: Ability to communicate needs accurately will improve Outcome: Progressing Goal: Ability to interact with others will improve Outcome: Progressing   Problem: Self-Concept: Goal: Will verbalize positive feelings about self Outcome: Progressing

## 2018-05-21 NOTE — BHH Group Notes (Signed)
Emotional Regulation 05/21/2018 1PM  Type of Therapy/Topic:  Group Therapy:  Emotion Regulation  Participation Level:  Active   Description of Group:   The purpose of this group is to assist patients in learning to regulate negative emotions and experience positive emotions. Patients will be guided to discuss ways in which they have been vulnerable to their negative emotions. These vulnerabilities will be juxtaposed with experiences of positive emotions or situations, and patients will be challenged to use positive emotions to combat negative ones. Special emphasis will be placed on coping with negative emotions in conflict situations, and patients will process healthy conflict resolution skills.  Therapeutic Goals: 1. Patient will identify two positive emotions or experiences to reflect on in order to balance out negative emotions 2. Patient will label two or more emotions that they find the most difficult to experience 3. Patient will demonstrate positive conflict resolution skills through discussion and/or role plays  Summary of Patient Progress: Actively and appropriately engaged in the group. Patient was able to provide support and validation to other group members.Patient practiced active listening when interacting with the facilitator and other group members. Good insight displayed.      Therapeutic Modalities:   Cognitive Behavioral Therapy Feelings Identification Dialectical Behavioral Therapy   Yvette Rack, LCSW 05/21/2018 2:03 PM

## 2018-05-21 NOTE — BHH Group Notes (Signed)
[place] Group Notes:  (Nursing/MHT/Case Management/Adjunct)  Date:  05/21/2018  Time:  9:52 PM  Type of Therapy:  Group Therapy  Participation Level:  Minimal  Participation Quality:  Appropriate  Affect:  Appropriate  Cognitive:  Appropriate  Insight:  Appropriate  Engagement in Group:  Engaged  Modes of Intervention:  Education  Summary of Progress/Problems: Binnie's nurse needed him so he was pulled from group halfway through. MHT informed patients of rules and expectations of the unit. MHT reviewed visitation and call hours. MHT informed patients they needed to provide their code to any one they wanted to contact them. MHT reminded patients that staff would not confirm or deny if they were on the unit if the person did not have the code. MHT encouraged patients not to take food to their rooms. MHT encouraged patients to clean up after themselves. MHT informed patients of routine checks and not to be alarmed if they see or hear techs opening their doors. MHT processed with patients about shift change at 7a and 7p. MHT encouraged patients to get what they need before report started or after it was completed. MHT informed patients not to give out personal information and to only use first names while on the unit. MHT discussed resources that were available in the community. Barnie Mort 05/21/2018, 9:52 PM

## 2018-05-21 NOTE — BHH Suicide Risk Assessment (Signed)
[place] INPATIENT:  Family/Significant Other Suicide Prevention Education  Suicide Prevention Education:  Education Completed; Cristopher Peru, cousin [phone] has been identified by the patient as the family member/significant other with whom the patient will be residing, and identified as the person(s) who will aid the patient in the event of a mental health crisis (suicidal ideations/suicide attempt).  With written consent from the patient, the family member/significant other has been provided the following suicide prevention education, prior to the and/or following the discharge of the patient.  The suicide prevention education provided includes the following:  Suicide risk factors  Suicide prevention and interventions  National Suicide Hotline telephone number  [facility] assessment telephone number  [facility] Emergency Assistance [place] and/or Residential Mobile Crisis Unit telephone number  Request made of family/significant other to:  Remove weapons (e.g., guns, rifles, knives), all items previously/currently identified as safety concern.    Remove drugs/medications (over-the-counter, prescriptions, illicit drugs), all items previously/currently identified as a safety concern.  The family member/significant other verbalizes understanding of the suicide prevention education information provided.  The family member/significant other agrees to remove the items of safety concern listed above. Ms. Nikki Dom expressed concern about pt not having housing when he discharges from the hospital. She says that she is very close with pt but he cannot live in her home. She also states pt's wife has been supportive and takes care of him. She says the pt has burned bridges with his wife, other family members and friends that he has very little support. Ms. Nikki Dom lives in [place] and pt communicated to [place] that he plans to live with family in [place] when he  discharges from hospital. Ms. Nikki Dom reports she is not aware of pt having access to guns or weapons.  Shelvy Heckert T Lynsie Mcwatters 05/21/2018, 4:03 PM

## 2018-05-21 NOTE — Progress Notes (Signed)
Recreation Therapy Notes  Date: 05/21/2018  Time: 9:30 am  Location: Craft Room  Behavioral response: Appropriate  Intervention Topic: Problem Solving  Discussion/Intervention:  Group content on today was focused on problem solving. The group described what problem solving is. Patients expressed how problems affect them and how they deal with problems. Individuals identified healthy ways to deal with problems. Patients explained what normally happens to them when they do not deal with problems. The group expressed reoccurring problems for them. The group participated in the intervention "Put the story together" with their peers and worked together to put a story that was broken up in the correct order. Clinical Observations/Feedback:  Patient came to group and defined problem solving as finding a solution to what he is dealing with. He stated that when he has a problem, he likes to analyze things before coming up with a solution. Participant explained that when he doe not handle his problems they lead to other problems. Patient expressed that handling problems can promote your mental, physical and emotional health. Individual was social with peers and staff while participating in the intervention. Vaniya Augspurger LRT/CTRS            Hager Compston 05/21/2018 12:16 PM

## 2018-05-21 NOTE — Plan of Care (Signed)
Patient is stable and cooperative , takes his medications and voice no concerns, maintaining safety in the unit,contract for safety, attends groups , socialize well with peers, and sleep is continuous , and denies SI/HI/AVH, , 15 minute safety checks is in progress.   Problem: Education: Goal: Knowledge of [facility] Education information/materials will improve Outcome: Progressing Goal: Emotional status will improve Outcome: Progressing Goal: Mental status will improve Outcome: Progressing Goal: Verbalization of understanding the information provided will improve Outcome: Progressing   Problem: Activity: Goal: Interest or engagement in activities will improve Outcome: Progressing Goal: Sleeping patterns will improve Outcome: Progressing   Problem: Coping: Goal: Ability to verbalize frustrations and anger appropriately will improve Outcome: Progressing Goal: Ability to demonstrate self-control will improve Outcome: Progressing   Problem: Health Behavior/Discharge Planning: Goal: Identification of resources available to assist in meeting health care needs will improve Outcome: Progressing Goal: Compliance with treatment plan for underlying cause of condition will improve Outcome: Progressing   Problem: Physical Regulation: Goal: Ability to maintain clinical measurements within normal limits will improve Outcome: Progressing   Problem: Safety: Goal: Periods of time without injury will increase Outcome: Progressing   Problem: Education: Goal: Will be free of psychotic symptoms Outcome: Progressing Goal: Knowledge of the prescribed therapeutic regimen will improve Outcome: Progressing   Problem: Coping: Goal: Coping ability will improve Outcome: Progressing Goal: Will verbalize feelings Outcome: Progressing   Problem: Health Behavior/Discharge Planning: Goal: Compliance with prescribed medication regimen will improve Outcome: Progressing   Problem: Role  Relationship: Goal: Ability to communicate needs accurately will improve Outcome: Progressing Goal: Ability to interact with others will improve Outcome: Progressing   Problem: Self-Concept: Goal: Will verbalize positive feelings about self Outcome: Progressing

## 2018-05-22 LAB — GLUCOSE, CAPILLARY
Glucose-Capillary: 115 mg/dL — ABNORMAL HIGH (ref 70–99)
Glucose-Capillary: 258 mg/dL — ABNORMAL HIGH (ref 70–99)
Glucose-Capillary: 134 mg/dL — ABNORMAL HIGH (ref 70–99)
Glucose-Capillary: 234 mg/dL — ABNORMAL HIGH (ref 70–99)

## 2018-05-22 MED ORDER — LORATADINE 10 MG PO TABS: 10.0000 mg | ORAL_TABLET | Freq: Every day | ORAL | 0 refills | Status: AC

## 2018-05-22 MED ORDER — HYDROMORPHONE HCL 4 MG PO TABS: 4.0000 mg | ORAL_TABLET | ORAL | 0 refills | Status: DC | PRN

## 2018-05-22 MED ORDER — TEMAZEPAM 15 MG PO CAPS: 15.0000 mg | ORAL_CAPSULE | Freq: Every evening | ORAL | 0 refills | Status: AC | PRN

## 2018-05-22 MED ORDER — BISACODYL 5 MG PO TBEC: 5.0000 mg | DELAYED_RELEASE_TABLET | Freq: Every day | ORAL | 0 refills | Status: AC | PRN

## 2018-05-22 MED ORDER — LITHIUM CARBONATE ER 450 MG PO TBCR: 900.0000 mg | EXTENDED_RELEASE_TABLET | Freq: Every day | ORAL | 1 refills | Status: AC

## 2018-05-22 MED ORDER — SENNA 8.6 MG PO TABS: 1.0000 | ORAL_TABLET | Freq: Every day | ORAL | 1 refills | Status: AC

## 2018-05-22 MED ORDER — TEMAZEPAM 15 MG PO CAPS: 15.0000 mg | ORAL_CAPSULE | Freq: Every day | ORAL | 0 refills | Status: AC

## 2018-05-22 MED ORDER — TEMAZEPAM 15 MG PO CAPS
15.0000 mg | ORAL_CAPSULE | Freq: Every day | ORAL | Status: DC
  Administered 2018-05-22: 15 mg via ORAL
  Filled 2018-05-22: qty 1

## 2018-05-22 MED ORDER — PANCRELIPASE (LIP-PROT-AMYL) 36000-114000 UNITS PO CPEP: 36000.0000 [IU] | ORAL_CAPSULE | Freq: Four times a day (QID) | ORAL | 1 refills | Status: AC

## 2018-05-22 MED ORDER — OLANZAPINE 15 MG PO TABS: 15.0000 mg | ORAL_TABLET | Freq: Every day | ORAL | 1 refills | Status: AC

## 2018-05-22 NOTE — Plan of Care (Signed)
Resources  with Assessment  with [facility]  Able to vent positive feeling of self  able to communicate his needs correctly  Able to verbalize understanding of information received . Emotional and mental status improved. Attending unit programing  able to verbalize feeling Interaction with peers . Voice no concerns around sleep .  Working on Radiographer, therapeutic . No anger outburst or control issues No safety concerns  patient remain manic . Compliant  with medication.   Problem: Education: Goal: Knowledge of [facility] Education information/materials will improve Outcome: Progressing Goal: Emotional status will improve Outcome: Progressing Goal: Mental status will improve Outcome: Progressing Goal: Verbalization of understanding the information provided will improve Outcome: Progressing   Problem: Activity: Goal: Interest or engagement in activities will improve Outcome: Progressing Goal: Sleeping patterns will improve Outcome: Progressing   Problem: Coping: Goal: Ability to verbalize frustrations and anger appropriately will improve Outcome: Progressing Goal: Ability to demonstrate self-control will improve Outcome: Progressing   Problem: Health Behavior/Discharge Planning: Goal: Identification of resources available to assist in meeting health care needs will improve Outcome: Progressing Goal: Compliance with treatment plan for underlying cause of condition will improve Outcome: Progressing   Problem: Physical Regulation: Goal: Ability to maintain clinical measurements within normal limits will improve Outcome: Progressing   Problem: Safety: Goal: Periods of time without injury will increase Outcome: Progressing   Problem: Education: Goal: Will be free of psychotic symptoms Outcome: Progressing Goal: Knowledge of the prescribed therapeutic regimen will improve Outcome: Progressing   Problem: Coping: Goal: Coping ability will improve Outcome:  Progressing Goal: Will verbalize feelings Outcome: Progressing   Problem: Health Behavior/Discharge Planning: Goal: Compliance with prescribed medication regimen will improve Outcome: Progressing   Problem: Role Relationship: Goal: Ability to communicate needs accurately will improve Outcome: Progressing Goal: Ability to interact with others will improve Outcome: Progressing   Problem: Self-Concept: Goal: Will verbalize positive feelings about self Outcome: Progressing

## 2018-05-22 NOTE — Progress Notes (Signed)
Recreation Therapy Notes  INPATIENT RECREATION THERAPY ASSESSMENT  Patient Details Name: Travis Hawkins MRN: [id number] DOB: [birth year] Today's Date: 05/22/2018       Information Obtained From: Patient  Able to Participate in Assessment/Interview: Yes  Patient Presentation: Responsive  Reason for Admission (Per Patient): Active Symptoms, Aggressive/Threatening  Patient Stressors: Relationship  Coping Skills:   Prayer, Read, Other (Comment)(Smoke)  Leisure Interests (2+):  Games - Video games, Music - Listen  Frequency of Recreation/Participation: Monthly  Awareness of Community Resources:     [employer]:     Current Use:    If no, Barriers?:    Expressed Interest in [place] of Residence:  Insurance underwriter  Patient Main Form of Transportation: Musician  Patient Strengths:  Selfless, caring, compassionate  Patient Identified Areas of Improvement:  Have more patience  Patient Goal for Hospitalization:  Get connected to outside resources  Current SI (including self-harm):  No  Current HI:  No  Current AVH: No  Staff Intervention Plan: Group Attendance, Collaborate with Interdisciplinary Treatment Team  Consent to Intern Participation: N/A  Kelsen Celona 05/22/2018, 10:57 AM

## 2018-05-22 NOTE — Progress Notes (Signed)
D: Patient stated slept fair last night .Stated appetite  good and energy level   normal. Stated concentration is good . Stated on Depression scale 0 , hopeless 0 and anxiety 0 .( low 0-10 high) Denies suicidal  homicidal ideations  .  No auditory hallucinations  No pain concerns . Appropriate ADL'S. Interacting with peers and staff.  Patient  Met with [facility] for ACTT  assessment   A: Encourage patient participation with unit programming . Instruction  Given on  Medication , verbalize understanding.Able to vent positive feeling of self able to communicate his needs correctly. Able to verbalize understanding of information received . Emotional and mental status improved. Attending unit programing able to verbalize feeling Interaction with peers . Voice no concerns around sleep . Working on Radiographer, therapeutic . No anger outburst or control issues No safety concerns patient remain manic . Compliant with medication.  R: Voice no other concerns. Staff continue to monitor

## 2018-05-22 NOTE — Plan of Care (Signed)
Patient is compliant with his medications , attends groups , participate in scheduled activities with peers contact for safety and denies any SI/HI/AVH , mood is bright and affect is good , sleep is continuous without any interruptions ,  support and encouragement is provided no distress  .

## 2018-05-22 NOTE — BHH Suicide Risk Assessment (Signed)
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   HENT: Negative.   Eyes: Negative.   Respiratory: Negative.   Cardiovascular: Negative.   Gastrointestinal: Negative.   Musculoskeletal: Negative.   Skin: Negative.   Neurological: Negative.   Psychiatric/Behavioral: Negative.     Blood pressure 111/78, pulse 98, temperature 98 F (36.7 C), temperature source Oral, resp. rate 18, height 5\' 8"  (1.727 m), weight 67.1 kg, SpO2 100 %.Body mass index is 22.5 kg/m.  General Appearance: Casual  Eye Contact::  Good  Speech:  Clear and [id number]  Volume:  Normal  Mood:  Euthymic  Affect:  Congruent  Thought Process:  Goal Directed  Orientation:  Full (Time, Place, and Person)  Thought Content:  Logical  Suicidal Thoughts:  No  Homicidal Thoughts:  No  Memory:  Immediate;   Fair Recent;   Fair Remote;   Fair  Judgement:  Fair  Insight:  Fair  Psychomotor Activity:  Normal  Concentration:  Fair  Recall:  [employer] of Knowledge:Fair  Language: Fair  Akathisia:  No  Handed:  Right  AIMS (if indicated):     Assets:  Desire for Improvement Resilience Social Support  Sleep:  Number of Hours: 6.75  Cognition: WNL  ADL's:  Intact   Mental Status Per Nursing Assessment::   On Admission:  NA(Denies)  Demographic Factors:  Male and Living alone  Loss Factors: Loss of significant relationship  Historical Factors: Impulsivity  Risk Reduction Factors:   Sense of responsibility to family,  Religious beliefs about death, Positive social support and Positive therapeutic relationship  Continued Clinical Symptoms:  Bipolar Disorder:   Mixed State  Cognitive Features That Contribute To Risk:  Loss of executive function    Suicide Risk:  Minimal: No identifiable suicidal ideation.  Patients presenting with no risk factors but with morbid ruminations; may be classified as minimal risk based on the severity of the depressive symptoms    Plan Of Care/Follow-up recommendations:  Activity:  Activity as tolerated Diet:  Diabetic diet Other:  Follow-up with the strategic act team and his regular medical providers  Alethia Berthold, MD 05/22/2018, 4:40 PM

## 2018-05-22 NOTE — BHH Counselor (Signed)
Olancha Person([facility], therapist) met with pt to complete ACTT assessment. She reports if pt is approved they can link him to the [facility](transitional community living initiative).

## 2018-05-22 NOTE — Progress Notes (Signed)
[facility] MD Progress Note  05/22/2018 4:37 PM Travis Hawkins  MRN:  [id number] Subjective: Patient reports no new complaints.  He says he is feeling good.  Denies anger denies depression denies any suicidal or homicidal thoughts.  No side effects of medicine.  Cooperative with treatment.  Patient is enthusiastic about a plan for discharge tomorrow Principal Problem: Bipolar 1 disorder, manic, moderate (HCC) Diagnosis: Principal Problem:   Bipolar 1 disorder, manic, moderate (HCC) Active Problems:   Malignant tumor of head of pancreas ([place])   Diabetes mellitus ([place])   Chronic pancreatitis ([place])  Total Time spent with patient: 20 minutes  Past Psychiatric History: History of bipolar disorder with prior hospitalizations years ago.  Past Medical History:  Past Medical History:  Diagnosis Date  . Biliary obstruction   . Depression   . Mood disorder [facility])     Past Surgical History:  Procedure Laterality Date  . CHOLECYSTECTOMY    . ENDOSCOPIC RETROGRADE CHOLANGIOPANCREATOGRAPHY (ERCP) WITH PROPOFOL N/A 03/11/2017   Procedure: ENDOSCOPIC RETROGRADE CHOLANGIOPANCREATOGRAPHY (ERCP) WITH PROPOFOL;  Surgeon: Lucilla Lame, MD;  Location: [facility] ENDOSCOPY;  Service: Endoscopy;  Laterality: N/A;  . ERCP N/A 03/19/2017   Procedure: ENDOSCOPIC RETROGRADE CHOLANGIOPANCREATOGRAPHY (ERCP);  Surgeon: Lucilla Lame, MD;  Location: [facility] ENDOSCOPY;  Service: Endoscopy;  Laterality: N/A;  . ERCP N/A 06/25/2017   Procedure: ENDOSCOPIC RETROGRADE CHOLANGIOPANCREATOGRAPHY (ERCP);  Surgeon: Lucilla Lame, MD;  Location: [facility] ENDOSCOPY;  Service: Endoscopy;  Laterality: N/A;  . ERCP N/A 07/16/2017   Procedure: ENDOSCOPIC RETROGRADE CHOLANGIOPANCREATOGRAPHY (ERCP);  Surgeon: Lucilla Lame, MD;  Location: [facility] ENDOSCOPY;  Service: Endoscopy;  Laterality: N/A;  . none     Family History:  Family History  Problem Relation Age of Onset  . Cancer Maternal Aunt   . Breast cancer Maternal Grandmother   . Lung cancer Maternal  Grandfather   . Diabetes Mellitus II Mother    Family Psychiatric  History: None known Social History:  Social History   Substance and Sexual Activity  Alcohol Use No  . Frequency: Never   Comment: Occasional wine about every 3-4 months     Social History   Substance and Sexual Activity  Drug Use No    Social History   Socioeconomic History  . Marital status: Divorced    Spouse name: Not on file  . Number of children: Not on file  . Years of education: Not on file  . Highest education level: Not on file  Occupational History  . Not on file  Social Needs  . Financial resource strain: Not on file  . Food insecurity:    Worry: Not on file    Inability: Not on file  . Transportation needs:    Medical: Not on file    Non-medical: Not on file  Tobacco Use  . Smoking status: Former Smoker    Packs/day: 1.00    Years: 18.00    Pack years: 18.00    Types: E-cigarettes  . Smokeless tobacco: Never Used  Substance and Sexual Activity  . Alcohol use: No    Frequency: Never    Comment: Occasional wine about every 3-4 months  . Drug use: No  . Sexual activity: Yes    Partners: Female  Lifestyle  . Physical activity:    Days per week: Not on file    Minutes per session: Not on file  . Stress: Not on file  Relationships  . Social connections:    Talks on phone: Not on file    Gets  together: Not on file    Attends religious service: Not on file    Active member of club or organization: Not on file    Attends meetings of clubs or organizations: Not on file    Relationship status: Not on file  Other Topics Concern  . Not on file  Social History Narrative  . Not on file   Additional Social History:                         Sleep: Fair  Appetite:  Fair  Current Medications: Current Facility-Administered Medications  Medication Dose Route Frequency Provider Last Rate Last Dose  . acetaminophen (TYLENOL) tablet 650 mg  650 mg Oral Q6H PRN Lavella Hammock, MD      . alum & mag hydroxide-simeth (MAALOX/MYLANTA) 200-200-20 MG/5ML suspension 30 mL  30 mL Oral Q4H PRN Lavella Hammock, MD      . amoxicillin (AMOXIL) capsule 1,000 mg  1,000 mg Oral Q12H Lavella Hammock, MD   1,000 mg at 05/22/18 0754  . bisacodyl (DULCOLAX) EC tablet 5 mg  5 mg Oral Daily PRN Lavella Hammock, MD      . HYDROcodone-acetaminophen (NORCO/VICODIN) 5-325 MG per tablet 1 tablet  1 tablet Oral Q4H PRN Lavella Hammock, MD   1 tablet at 05/22/18 1015  . HYDROmorphone (DILAUDID) tablet 4 mg  4 mg Oral Q4H PRN Zuriel Yeaman T, MD      . lipase/protease/amylase (CREON) capsule 36,000 Units  36,000 Units Oral QID Pucilowska, Jolanta B, MD   36,000 Units at 05/22/18 1631  . lithium carbonate (ESKALITH) CR tablet 900 mg  900 mg Oral QHS Coltan Spinello, Madie Reno, MD   900 mg at 05/21/18 2054  . loratadine (CLARITIN) tablet 10 mg  10 mg Oral Daily Lavella Hammock, MD   10 mg at 05/22/18 0755  . LORazepam (ATIVAN) tablet 0.5 mg  0.5 mg Oral QHS PRN Lavella Hammock, MD   0.5 mg at 05/19/18 2153  . magnesium hydroxide (MILK OF MAGNESIA) suspension 30 mL  30 mL Oral Daily PRN Lavella Hammock, MD      . nicotine (NICODERM CQ - dosed in mg/24 hours) patch 14 mg  14 mg Transdermal Daily Lavella Hammock, MD   14 mg at 05/22/18 0759  . OLANZapine (ZYPREXA) tablet 15 mg  15 mg Oral QHS Lavella Hammock, MD   15 mg at 05/21/18 2054  . protein supplement (PREMIER PROTEIN) liquid - approved for s/p bariatric surgery  11 oz Oral BID BM Lavella Hammock, MD      . senna [facility]) tablet 8.6 mg  1 tablet Oral QHS Lavella Hammock, MD   8.6 mg at 05/21/18 2055    Lab Results:  Results for orders placed or performed during the hospital encounter of 05/19/18 (from the past 48 hour(s))  Glucose, capillary     Status: Abnormal   Collection Time: 05/21/18  6:53 AM  Result Value Ref Range   Glucose-Capillary 146 (H) 70 - 99 mg/dL   Comment 1 Notify RN   Glucose, capillary     Status: Abnormal    Collection Time: 05/21/18 11:57 AM  Result Value Ref Range   Glucose-Capillary 165 (H) 70 - 99 mg/dL  Glucose, capillary     Status: Abnormal   Collection Time: 05/21/18  4:37 PM  Result Value Ref Range   Glucose-Capillary 151 (H) 70 - 99 mg/dL  Glucose, capillary     Status: Abnormal   Collection Time: 05/21/18  9:03 PM  Result Value Ref Range   Glucose-Capillary 174 (H) 70 - 99 mg/dL  Glucose, capillary     Status: Abnormal   Collection Time: 05/22/18  7:06 AM  Result Value Ref Range   Glucose-Capillary 115 (H) 70 - 99 mg/dL   Comment 1 Notify RN   Glucose, capillary     Status: Abnormal   Collection Time: 05/22/18 11:28 AM  Result Value Ref Range   Glucose-Capillary 258 (H) 70 - 99 mg/dL  Glucose, capillary     Status: Abnormal   Collection Time: 05/22/18  4:24 PM  Result Value Ref Range   Glucose-Capillary 134 (H) 70 - 99 mg/dL    Blood Alcohol level:  Lab Results  Component Value Date   ETH <10 67/89/3810    Metabolic Disorder Labs: Lab Results  Component Value Date   HGBA1C 6.4 (H) 05/19/2018   MPG 136.98 05/19/2018   MPG 116.89 03/11/2017   No results found for: PROLACTIN Lab Results  Component Value Date   CHOL 229 (H) 03/15/2017   TRIG 451 (H) 08/12/2017   HDL <10 (L) 03/15/2017   CHOLHDL NOT CALCULATED 03/15/2017   VLDL UNABLE TO CALCULATE IF TRIGLYCERIDE OVER 400 mg/dL 03/15/2017   LDLCALC UNABLE TO CALCULATE IF TRIGLYCERIDE OVER 400 mg/dL 03/15/2017    Physical Findings: AIMS:  , ,  ,  ,    CIWA:    COWS:     Musculoskeletal: Strength & Muscle Tone: within normal limits Gait & Station: normal Patient leans: N/A  Psychiatric Specialty Exam: Physical Exam  Nursing note and vitals reviewed. Constitutional: He appears well-developed and well-nourished.  HENT:  Head: Normocephalic and atraumatic.  Eyes: Pupils are equal, round, and reactive to light. Conjunctivae are normal.  Neck: Normal range of motion.  Cardiovascular: Regular rhythm and  normal heart sounds.  Respiratory: Effort normal. No respiratory distress.  GI: Soft.  Musculoskeletal: Normal range of motion.  Neurological: He is alert.  Skin: Skin is warm and dry.  Psychiatric: He has a normal mood and affect. His speech is normal and behavior is normal. Judgment and thought content normal. Cognition and memory are normal.    Review of Systems  Constitutional: Negative.   HENT: Negative.   Eyes: Negative.   Respiratory: Negative.   Cardiovascular: Negative.   Gastrointestinal: Negative.   Musculoskeletal: Negative.   Skin: Negative.   Neurological: Negative.   Psychiatric/Behavioral: Negative.     Blood pressure 111/78, pulse 98, temperature 98 F (36.7 C), temperature source Oral, resp. rate 18, height 5\' 8"  (1.727 m), weight 67.1 kg, SpO2 100 %.Body mass index is 22.5 kg/m.  General Appearance: Fairly Groomed  Eye Contact:  Good  Speech:  Clear and Coherent  Volume:  Normal  Mood:  Euthymic  Affect:  Appropriate  Thought Process:  Goal Directed  Orientation:  Full (Time, Place, and Person)  Thought Content:  Logical  Suicidal Thoughts:  No  Homicidal Thoughts:  No  Memory:  Immediate;   Fair Recent;   Fair Remote;   Fair  Judgement:  Fair  Insight:  Fair  Psychomotor Activity:  Normal  Concentration:  Concentration: Fair  Recall:  [employer] of Knowledge:  Fair  Language:  Fair  Akathisia:  No  Handed:  Right  AIMS (if indicated):     Assets:  Communication Skills Desire for Improvement Resilience Social Support  ADL's:  Intact  Cognition:  WNL  Sleep:  Number of Hours: 6.75     Treatment Plan Summary: Daily contact with patient to assess and evaluate symptoms and progress in treatment, Medication management and Plan Patient appears to have stabilized and is tolerating current medicine well.  Plan will be for discharge tomorrow with follow-up with strategic act team and his outpatient medical providers.  Alethia Berthold, MD 05/22/2018,  4:37 PM

## 2018-05-22 NOTE — BHH Group Notes (Signed)
LCSW Group Therapy Note  05/22/2018 1:00 PM  Type of Therapy/Topic:  Group Therapy:  Balance in Life  Participation Level:  Active  Description of Group:    This group will address the concept of balance and how it feels and looks when one is unbalanced. Patients will be encouraged to process areas in their lives that are out of balance and identify reasons for remaining unbalanced. Facilitators will guide patients in utilizing problem-solving interventions to address and correct the stressor making their life unbalanced. Understanding and applying boundaries will be explored and addressed for obtaining and maintaining a balanced life. Patients will be encouraged to explore ways to assertively make their unbalanced needs known to significant others in their lives, using other group members and facilitator for support and feedback.  Therapeutic Goals: 1. Patient will identify two or more emotions or situations they have that consume much of in their lives. 2. Patient will identify signs/triggers that life has become out of balance:  3. Patient will identify two ways to set boundaries in order to achieve balance in their lives:  4. Patient will demonstrate ability to communicate their needs through discussion and/or role plays  Summary of Patient Progress: Patient was present for group and an active participant.  Patient reports that feeling off balance has him feeling "uneasy".  Patient discussed how faith has been an support in helping him to find balance, cope and problem solve with the things that have made him feel off balance.    Therapeutic Modalities:   Cognitive Behavioral Therapy Solution-Focused Therapy Assertiveness Training  Assunta Curtis MSW, LCSW 05/22/2018 4:11 PM

## 2018-05-22 NOTE — Progress Notes (Signed)
Pastoral Care Follow Up    05/22/18 1100  Clinical Encounter Type  Visited With Patient  Visit Type Follow-up  Referral From Patient;Nurse  Consult/Referral To Chaplain  Spiritual Encounters  Spiritual Needs [facility] text;Emotional   Pt enjoys speaking with chap and requests regularly to see.  Pt is eager to go home and thankful for the support and care he has been receiving here at [facility].  Pt reports that he will be leaving tomorrow and that he is not sure where he will stay for the weekend, but has several options.  He reports to have secured a place to stay that is very reasonable and he will be able to move in there on Monday.  Pt reports to have continued stress with his wife and says they have mutually agreed to part ways.  Pt reports that his bishop came to visit yesterday.  Melven Sartorius provided compassionate presence, empathy, and listened to pt story.  Darcey Nora, Chaplain

## 2018-05-22 NOTE — Progress Notes (Signed)
Recreation Therapy Notes  Date: 05/22/2018  Time: 9:30 am  Location: Craft Room  Behavioral response: Appropriate  Intervention Topic: Time Management  Discussion/Intervention:  Group content today was focused on time management. The group defined time management and identified healthy ways to manage time. Individuals expressed how much of the 24 hours they use in a day. Patients expressed how much time they use just for themselves personally. The group expressed how they have managed their time in the past. Individuals participated in the intervention "Managing Life" where they had a chance to see how much of the 24 hours they use and where it goes. Clinical Observations/Feedback:  Patient came to group and defined time management as prioritizing. He rated his time management skills an 8/10. Participant expressed that he could improve his time management skills by figuring out what is most important. Patient explained that time management is important to prevent burn out.  Individual was social with peers and staff while participating in the intervention. Rea Kalama LRT/CTRS          Christianna Belmonte 05/22/2018 11:48 AM

## 2018-05-22 NOTE — Progress Notes (Addendum)
Inpatient Diabetes Program Recommendations  AACE/ADA: New Consensus Statement on Inpatient Glycemic Control (2015)  Target Ranges:  Prepandial:   less than 140 mg/dL      Peak postprandial:   less than 180 mg/dL (1-2 hours)      Critically ill patients:  140 - 180 mg/dL   Lab Results  Component Value Date   GLUCAP 258 (H) 05/22/2018   HGBA1C 6.4 (H) 05/19/2018    Review of Glycemic ControlResults for Travis Hawkins, Travis Hawkins (MRN [id number]) as of 05/22/2018 12:07  Ref. Range 05/21/2018 16:37 05/21/2018 21:03 05/22/2018 07:06 05/22/2018 11:28  Glucose-Capillary Latest Ref Range: 70 - 99 mg/dL 151 (H) 174 (H) 115 (H) 258 (H)  History:DM, Pancreatic Cancer  Home DM Meds:Humalog 8 units TID with meals Humalog SSI (see notes from [facility] from PCP visit 05/08/2018)  Current Orders:CBGs tid ac + hs  Inpatient Diabetes Program Recommendations:    Please consider re-ordering the following:  1. Novolog Sensitive Correction Scale/ SSI (0-9 units) TID AC + HS  2. Novolog 3 units TID with meals (hold if patient eats less than 50% or NPO)  Thanks,  Adah Perl, RN, BC-ADM Inpatient Diabetes Coordinator Pager [phone] (8a-5p)

## 2018-05-23 LAB — GLUCOSE, CAPILLARY: Glucose-Capillary: 152 mg/dL — ABNORMAL HIGH (ref 70–99)

## 2018-05-23 NOTE — Progress Notes (Signed)
Recreation Therapy Notes  Time: 9:30 am  Location: Craft Room  Behavioral response: Appropriate  Intervention Topic: Teamwork  Discussion/Intervention:  Group content on today was focused on teamwork. The group identified what teamwork is. Individuals described who is a part of their team. Patients expressed why they thought teamwork is important. The group stated reasons why they thought it was easier to work with a Dance movement psychotherapist team. Individuals discussed some positives and negatives of working with a team. Patients gave examples of past experiences they had while working with a team. The group participated in the intervention "What is That", where patients were given a chance to point out qualities they look for in a team mate and were able to work in teams with each other.  Clinical Observations/Feedback:  Patient came to group and defined team work as being able to work together collectively for a common goal. He explained that most teams have problems because everyone wants to be a Financial risk analyst. Participant identified a negative aspect of team work as Product manager. Individual was social with peers and staff while participating in the intervention. Sylver Vantassell LRT/CTRS         Sheva Mcdougle 05/23/2018 10:09 AM

## 2018-05-23 NOTE — Plan of Care (Signed)
Patient is anticipating discharge stable and doing well in the unit no distress Problem: Education: Goal: Knowledge of [facility] Education information/materials will improve Outcome: Progressing Goal: Emotional status will improve Outcome: Progressing Goal: Mental status will improve Outcome: Progressing Goal: Verbalization of understanding the information provided will improve Outcome: Progressing   Problem: Activity: Goal: Interest or engagement in activities will improve Outcome: Progressing Goal: Sleeping patterns will improve Outcome: Progressing   Problem: Coping: Goal: Ability to verbalize frustrations and anger appropriately will improve Outcome: Progressing Goal: Ability to demonstrate self-control will improve Outcome: Progressing   Problem: Health Behavior/Discharge Planning: Goal: Identification of resources available to assist in meeting health care needs will improve Outcome: Progressing Goal: Compliance with treatment plan for underlying cause of condition will improve Outcome: Progressing   Problem: Physical Regulation: Goal: Ability to maintain clinical measurements within normal limits will improve Outcome: Progressing   Problem: Safety: Goal: Periods of time without injury will increase Outcome: Progressing   Problem: Education: Goal: Will be free of psychotic symptoms Outcome: Progressing Goal: Knowledge of the prescribed therapeutic regimen will improve Outcome: Progressing   Problem: Coping: Goal: Coping ability will improve Outcome: Progressing Goal: Will verbalize feelings Outcome: Progressing   Problem: Health Behavior/Discharge Planning: Goal: Compliance with prescribed medication regimen will improve Outcome: Progressing   Problem: Role Relationship: Goal: Ability to communicate needs accurately will improve Outcome: Progressing Goal: Ability to interact with others will improve Outcome: Progressing   Problem:  Self-Concept: Goal: Will verbalize positive feelings about self Outcome: Progressing

## 2018-05-23 NOTE — Progress Notes (Signed)
Patient denies SI/HI, denies A/V hallucinations. Patient verbalizes understanding of discharge instructions, follow up care and prescriptions.All home medications given to patient. Patient given all belongings from [facility] locker. Patient escorted out by staff, transported by cab.

## 2018-05-23 NOTE — Progress Notes (Signed)
Recreation Therapy Notes  INPATIENT RECREATION TR PLAN  Patient Details Name: Travis Hawkins MRN: [id number] DOB: 1976-01-09 Today's Date: 05/23/2018  Rec Therapy Plan Is patient appropriate for Therapeutic Recreation?: Yes Treatment times per week: at least 3 Estimated Length of Stay: 5-7 days TR Treatment/Interventions: Group participation (Comment)  Discharge Criteria Pt will be discharged from therapy if:: Discharged Treatment plan/goals/alternatives discussed and agreed upon by:: Patient/family  Discharge Summary Short term goals set: Patient will identify benefit of making healthy decisions post d/c within 5 recreation therapy group sessions Short term goals met: Complete Progress toward goals comments: Groups attended Which groups?: Communication, Other (Comment)(Time Mnanagement, Team work, Problem solving) Reason goals not met: N/A Therapeutic equipment acquired: N/A Reason patient discharged from therapy: Discharge from hospital Pt/family agrees with progress & goals achieved: Yes Date patient discharged from therapy: 05/23/18   Esmerelda Finnigan 05/23/2018, 10:12 AM

## 2018-05-23 NOTE — Discharge Summary (Signed)
Physician Discharge Summary Note  Patient:  Travis Hawkins is an 43 y.o., male MRN:  [id number] DOB:  1975-11-30 Patient phone:  [phone] (home)  Patient address:   [address] [place] [address],  Total Time spent with patient: 45 minutes  Date of Admission:  05/19/2018 Date of Discharge: May 23, 2018  Reason for Admission: Admitted through the emergency room because of manic symptoms with agitation and possible threatening behavior  Principal Problem: Bipolar 1 disorder, manic, moderate ([place]) Discharge Diagnoses: Principal Problem:   Bipolar 1 disorder, manic, moderate ([place]) Active Problems:   Malignant tumor of head of pancreas ([place])   Diabetes mellitus ([place])   Chronic pancreatitis ([place])   Past Psychiatric History: History of bipolar disorder with current manic episode going on for the past few months  Past Medical History:  Past Medical History:  Diagnosis Date  . Biliary obstruction   . Depression   . Mood disorder [facility])     Past Surgical History:  Procedure Laterality Date  . CHOLECYSTECTOMY    . ENDOSCOPIC RETROGRADE CHOLANGIOPANCREATOGRAPHY (ERCP) WITH PROPOFOL N/A 03/11/2017   Procedure: ENDOSCOPIC RETROGRADE CHOLANGIOPANCREATOGRAPHY (ERCP) WITH PROPOFOL;  Surgeon: Lucilla Lame, MD;  Location: [facility] ENDOSCOPY;  Service: Endoscopy;  Laterality: N/A;  . ERCP N/A 03/19/2017   Procedure: ENDOSCOPIC RETROGRADE CHOLANGIOPANCREATOGRAPHY (ERCP);  Surgeon: Lucilla Lame, MD;  Location: [facility] ENDOSCOPY;  Service: Endoscopy;  Laterality: N/A;  . ERCP N/A 06/25/2017   Procedure: ENDOSCOPIC RETROGRADE CHOLANGIOPANCREATOGRAPHY (ERCP);  Surgeon: Lucilla Lame, MD;  Location: [facility] ENDOSCOPY;  Service: Endoscopy;  Laterality: N/A;  . ERCP N/A 07/16/2017   Procedure: ENDOSCOPIC RETROGRADE CHOLANGIOPANCREATOGRAPHY (ERCP);  Surgeon: Lucilla Lame, MD;  Location: [facility] ENDOSCOPY;  Service: Endoscopy;  Laterality: N/A;  . none     Family History:  Family History  Problem  Relation Age of Onset  . Cancer Maternal Aunt   . Breast cancer Maternal Grandmother   . Lung cancer Maternal Grandfather   . Diabetes Mellitus II Mother    Family Psychiatric  History: None known Social History:  Social History   Substance and Sexual Activity  Alcohol Use No  . Frequency: Never   Comment: Occasional wine about every 3-4 months     Social History   Substance and Sexual Activity  Drug Use No    Social History   Socioeconomic History  . Marital status: Divorced    Spouse name: Not on file  . Number of children: Not on file  . Years of education: Not on file  . Highest education level: Not on file  Occupational History  . Not on file  Social Needs  . Financial resource strain: Not on file  . Food insecurity:    Worry: Not on file    Inability: Not on file  . Transportation needs:    Medical: Not on file    Non-medical: Not on file  Tobacco Use  . Smoking status: Former Smoker    Packs/day: 1.00    Years: 18.00    Pack years: 18.00    Types: E-cigarettes  . Smokeless tobacco: Never Used  Substance and Sexual Activity  . Alcohol use: No    Frequency: Never    Comment: Occasional wine about every 3-4 months  . Drug use: No  . Sexual activity: Yes    Partners: Female  Lifestyle  . Physical activity:    Days per week: Not on file    Minutes per session: Not on file  . Stress: Not on file  Relationships  . Social connections:    Talks on phone: Not on file    Gets together: Not on file    Attends religious service: Not on file    Active member of club or organization: Not on file    Attends meetings of clubs or organizations: Not on file    Relationship status: Not on file  Other Topics Concern  . Not on file  Social History Narrative  . Not on file    Hospital Course: Admitted to the psychiatric ward.  Continued on Zyprexa.  Added lithium.  Patient was cooperative with medication.  Showed improvement in mood and behavior.  Did not engage  in dangerous or threatening behavior.  Did not make any threatening statements.  Showed good insight.  Met with strategic act team and agreed to appropriate outpatient treatment.  Patient appeared to have stabilized and no longer met commitment criteria.  Discharged on combination primarily of Zyprexa and lithium as well as with a plan to continue his outpatient treatment for his cancer and diabetes.  Physical Findings: AIMS:  , ,  ,  ,    CIWA:    COWS:     Musculoskeletal: Strength & Muscle Tone: within normal limits Gait & Station: normal Patient leans: N/A  Psychiatric Specialty Exam: Physical Exam  Nursing note and vitals reviewed. Constitutional: He appears well-developed and well-nourished.  HENT:  Head: Normocephalic and atraumatic.  Eyes: Pupils are equal, round, and reactive to light. Conjunctivae are normal.  Neck: Normal range of motion.  Cardiovascular: Regular rhythm and normal heart sounds.  Respiratory: Effort normal. No respiratory distress.  GI: Soft.  Musculoskeletal: Normal range of motion.  Neurological: He is alert.  Skin: Skin is warm and dry.  Psychiatric: He has a normal mood and affect. His speech is normal and behavior is normal. Judgment and thought content normal. His affect is not blunt. His speech is not delayed. Cognition and memory are normal.    Review of Systems  Constitutional: Negative.   HENT: Negative.   Eyes: Negative.   Respiratory: Negative.   Cardiovascular: Negative.   Gastrointestinal: Negative.   Musculoskeletal: Negative.   Skin: Negative.   Neurological: Negative.   Psychiatric/Behavioral: Negative.     Blood pressure 115/78, pulse 85, temperature 98.2 F (36.8 C), temperature source Oral, resp. rate 18, height '5\' 8"'  (1.727 m), weight 67.1 kg, SpO2 100 %.Body mass index is 22.5 kg/m.  General Appearance: Casual  Eye Contact:  Good  Speech:  Clear and Coherent  Volume:  Normal  Mood:  Euthymic  Affect:  Congruent  Thought  Process:  Coherent  Orientation:  Full (Time, Place, and Person)  Thought Content:  Logical  Suicidal Thoughts:  No  Homicidal Thoughts:  No  Memory:  Immediate;   Fair Recent;   Fair Remote;   Fair  Judgement:  Fair  Insight:  Fair  Psychomotor Activity:  Normal  Concentration:  Concentration: Fair  Recall:  [employer] of Knowledge:  Fair  Language:  Fair  Akathisia:  No  Handed:  Right  AIMS (if indicated):     Assets:  Desire for Improvement Resilience  ADL's:  Intact  Cognition:  WNL  Sleep:  Number of Hours: 5.25     Have you used any form of tobacco in the last 30 days? (Cigarettes, Smokeless Tobacco, Cigars, and/or Pipes): Yes  Has this patient used any form of tobacco in the last 30 days? (Cigarettes, Smokeless Tobacco, Cigars, and/or Pipes)  Yes, No  Blood Alcohol level:  Lab Results  Component Value Date   ETH <10 35/45/6256    Metabolic Disorder Labs:  Lab Results  Component Value Date   HGBA1C 6.4 (H) 05/19/2018   MPG 136.98 05/19/2018   MPG 116.89 03/11/2017   No results found for: PROLACTIN Lab Results  Component Value Date   CHOL 229 (H) 03/15/2017   TRIG 451 (H) 08/12/2017   HDL <10 (L) 03/15/2017   CHOLHDL NOT CALCULATED 03/15/2017   VLDL UNABLE TO CALCULATE IF TRIGLYCERIDE OVER 400 mg/dL 03/15/2017   LDLCALC UNABLE TO CALCULATE IF TRIGLYCERIDE OVER 400 mg/dL 03/15/2017    See Psychiatric Specialty Exam and Suicide Risk Assessment completed by Attending Physician prior to discharge.  Discharge destination:  Home  Is patient on multiple antipsychotic therapies at discharge:  No   Has Patient had three or more failed trials of antipsychotic monotherapy by history:  No  Recommended Plan for Multiple Antipsychotic Therapies: NA  Discharge Instructions    Diet - low sodium heart healthy   Complete by:  As directed    Increase activity slowly   Complete by:  As directed      Allergies as of 05/23/2018      Reactions   Benztropine Other  (See Comments)   Tongue swelling   Lamotrigine Other (See Comments)   DILI 2018      Medication List    STOP taking these medications   acetaminophen 325 MG tablet Commonly known as:  TYLENOL   amoxicillin 500 MG tablet Commonly known as:  AMOXIL   clarithromycin 500 MG tablet Commonly known as:  BIAXIN   colestipol 1 g tablet Commonly known as:  COLESTID   ferrous sulfate 324 (65 Fe) MG Tbec   gabapentin 100 MG capsule Commonly known as:  NEURONTIN   HUMALOG KWIKPEN 100 UNIT/ML KwikPen Generic drug:  insulin lispro   HYDROcodone-acetaminophen 5-325 MG tablet Commonly known as:  NORCO/VICODIN   Melatonin 3 MG Tabs   nicotine 14 mg/24hr patch Commonly known as:  NICODERM CQ - dosed in mg/24 hours   oxyCODONE 10 mg 12 hr tablet Commonly known as:  OXYCONTIN   pantoprazole 40 MG tablet Commonly known as:  PROTONIX   protein supplement shake Liqd Commonly known as:  PREMIER PROTEIN   traZODone 100 MG tablet Commonly known as:  DESYREL     TAKE these medications     Indication  bisacodyl 5 MG EC tablet Commonly known as:  DULCOLAX Take 1 tablet (5 mg total) by mouth daily as needed for moderate constipation.  Indication:  Constipation   HYDROmorphone 4 MG tablet Commonly known as:  DILAUDID Take 1 tablet (4 mg total) by mouth every 4 (four) hours as needed for severe pain.  Indication:  Moderate to Severe Pain   lipase/protease/amylase 36000 UNITS Cpep capsule Commonly known as:  CREON Take 1 capsule (36,000 Units total) by mouth 4 (four) times daily. What changed:    medication strength  See the new instructions.  Indication:  Pancreatic Insufficiency   lithium carbonate 450 MG CR tablet Commonly known as:  ESKALITH Take 2 tablets (900 mg total) by mouth at bedtime.  Indication:  Manic-Depression   loratadine 10 MG tablet Commonly known as:  CLARITIN Take 1 tablet (10 mg total) by mouth daily.  Indication:  Upper Respiratory Tract Allergy    OLANZapine 15 MG tablet Commonly known as:  ZYPREXA Take 1 tablet (15 mg total) by mouth at bedtime. What  changed:    medication strength  how much to take  Indication:  Manic-Depression   senna 8.6 MG Tabs tablet Commonly known as:  SENOKOT Take 1 tablet (8.6 mg total) by mouth at bedtime.  Indication:  Constipation   temazepam 15 MG capsule Commonly known as:  RESTORIL Take 1 capsule (15 mg total) by mouth at bedtime as needed for sleep.  Indication:  Trouble Sleeping   temazepam 15 MG capsule Commonly known as:  RESTORIL Take 1 capsule (15 mg total) by mouth at bedtime.  Indication:  Trouble Sleeping      Follow-up Information    [facility] Call.   Why:  Please follow up with Stephanie(team lead) at [facility] for ACTT  Contact information: [address] [place] [address] [phone]           Follow-up recommendations:  Activity:  Activity as tolerated Diet:  Diabetic diet Other:  Follow-up with act team and [facility] for cancer treatment  Comments: Prescriptions provided the patient.  He is agreeable for outpatient treatment  Signed: Alethia Berthold, MD 05/23/2018, 5:23 PM

## 2018-05-23 NOTE — Progress Notes (Signed)
  [facility] Adult Case Management Discharge Plan :  Will you be returning to the same living situation after discharge:  No. Pt says he plans to go to his wife's home to get his car and he will stay with a friend or at a boarding house. Pt says he has income to pay living expense. At discharge, do you have transportation home?: Yes,  CSW will assist with transportation Do you have the ability to pay for your medications: Yes,  insurance  Release of information consent forms completed and in the chart;  Patient's signature needed at discharge.  Patient to Follow up at: Follow-up Information    [facility] Call.   Why:  Please follow up with Stephanie(team lead) at [facility] for ACTT  Contact information: [address] [place] [address] [phone]           Next level of care provider has access to [place] and Suicide Prevention discussed: Yes,  Cristopher [place], cousin  Have you used any form of tobacco in the last 30 days? (Cigarettes, Smokeless Tobacco, Cigars, and/or Pipes): Yes  Has patient been referred to the Quitline?: Physician counseled pt on smoking cessation  Patient has been referred for addiction treatment: N/A  Yvette Rack, LCSW 05/23/2018, 9:47 AM

## 2018-05-25 ENCOUNTER — Other Ambulatory Visit: Payer: Self-pay | Admitting: Psychiatry

## 2018-05-25 MED ORDER — HYDROMORPHONE HCL 4 MG PO TABS: 4.0000 mg | ORAL_TABLET | ORAL | 0 refills | Status: AC | PRN

## 2018-06-06 ENCOUNTER — Emergency Department (HOSPITAL_COMMUNITY)
Admission: EM | Admit: 2018-06-06 | Discharge: 2018-06-06 | Disposition: A | Discharge status: Home / Self Care | Payer: Medicaid Other | Attending: Emergency Medicine | Admitting: Emergency Medicine

## 2018-06-06 ENCOUNTER — Encounter (HOSPITAL_COMMUNITY): Payer: Self-pay

## 2018-06-06 ENCOUNTER — Other Ambulatory Visit: Payer: Self-pay

## 2018-06-06 DIAGNOSIS — C259 Malignant neoplasm of pancreas, unspecified: Secondary | ICD-10-CM

## 2018-06-06 DIAGNOSIS — R1013 Epigastric pain: Secondary | ICD-10-CM | POA: Diagnosis not present

## 2018-06-06 DIAGNOSIS — Z87891 Personal history of nicotine dependence: Secondary | ICD-10-CM | POA: Insufficient documentation

## 2018-06-06 LAB — COMPREHENSIVE METABOLIC PANEL
ALT: 17 U/L (ref 0–44)
AST: 23 U/L (ref 15–41)
Albumin: 3.5 g/dL (ref 3.5–5.0)
Alkaline Phosphatase: 113 U/L (ref 38–126)
Anion gap: 6 (ref 5–15)
BUN: 8 mg/dL (ref 6–20)
CO2: 26 mmol/L (ref 22–32)
CREATININE: 0.61 mg/dL (ref 0.61–1.24)
Calcium: 8.4 mg/dL — ABNORMAL LOW (ref 8.9–10.3)
Chloride: 103 mmol/L (ref 98–111)
Glucose, Bld: 219 mg/dL — ABNORMAL HIGH (ref 70–99)
Potassium: 3.7 mmol/L (ref 3.5–5.1)
Sodium: 135 mmol/L (ref 135–145)
Total Bilirubin: 1 mg/dL (ref 0.3–1.2)
Total Protein: 6.8 g/dL (ref 6.5–8.1)

## 2018-06-06 LAB — CBC
HCT: 31.3 % — ABNORMAL LOW (ref 39.0–52.0)
Hemoglobin: 9.7 g/dL — ABNORMAL LOW (ref 13.0–17.0)
MCH: 30.8 pg (ref 26.0–34.0)
MCHC: 31 g/dL (ref 30.0–36.0)
MCV: 99.4 fL (ref 80.0–100.0)
Platelets: 163 10*3/uL (ref 150–400)
RBC: 3.15 MIL/uL — ABNORMAL LOW (ref 4.22–5.81)
RDW: 14.5 % (ref 11.5–15.5)
WBC: 7.2 10*3/uL (ref 4.0–10.5)
nRBC: 0 % (ref 0.0–0.2)

## 2018-06-06 LAB — LIPASE, BLOOD: Lipase: 93 U/L — ABNORMAL HIGH (ref 11–51)

## 2018-06-06 LAB — I-STAT TROPONIN, ED: Troponin i, poc: 0.01 ng/mL (ref 0.00–0.08)

## 2018-06-06 LAB — LACTIC ACID, PLASMA: Lactic Acid, Venous: 1 mmol/L (ref 0.5–1.9)

## 2018-06-06 MED ORDER — FAMOTIDINE IN NACL 20-0.9 MG/50ML-% IV SOLN
20.0000 mg | Freq: Once | INTRAVENOUS | Status: AC
  Administered 2018-06-06: 20 mg via INTRAVENOUS
  Filled 2018-06-06: qty 50

## 2018-06-06 MED ORDER — ALUM & MAG HYDROXIDE-SIMETH 200-200-20 MG/5ML PO SUSP
30.0000 mL | Freq: Once | ORAL | Status: AC
  Administered 2018-06-06: 30 mL via ORAL
  Filled 2018-06-06: qty 30

## 2018-06-06 MED ORDER — LIDOCAINE VISCOUS HCL 2 % MT SOLN
15.0000 mL | Freq: Once | OROMUCOSAL | Status: AC
  Administered 2018-06-06: 15 mL via ORAL
  Filled 2018-06-06: qty 15

## 2018-06-06 MED ORDER — HEPARIN SOD (PORK) LOCK FLUSH 100 UNIT/ML IV SOLN
500.0000 [IU] | Freq: Once | INTRAVENOUS | Status: AC
  Administered 2018-06-06: 500 [IU]
  Filled 2018-06-06: qty 5

## 2018-06-06 MED ORDER — HYDROMORPHONE HCL 1 MG/ML IJ SOLN
0.5000 mg | Freq: Once | INTRAMUSCULAR | Status: AC
  Administered 2018-06-06: 0.5 mg via INTRAVENOUS
  Filled 2018-06-06: qty 1

## 2018-06-06 MED ORDER — SODIUM CHLORIDE 0.9 % IV BOLUS
1000.0000 mL | Freq: Once | INTRAVENOUS | Status: AC
  Administered 2018-06-06: 1000 mL via INTRAVENOUS

## 2018-06-06 MED ORDER — HEPARIN SODIUM LOCK FLUSH 100 UNIT/ML IV SOLN
5.00 | INTRAVENOUS | Status: DC
Start: ? — End: 2018-06-06

## 2018-06-06 MED ORDER — HYDROMORPHONE HCL 1 MG/ML IJ SOLN
1.0000 mg | Freq: Once | INTRAMUSCULAR | Status: AC
  Administered 2018-06-06: 1 mg via INTRAVENOUS
  Filled 2018-06-06: qty 1

## 2018-06-06 NOTE — ED Provider Notes (Signed)
[facility] Emergency Department Provider Note MRN:  [id number]  Arrival date & time: 06/06/18     Chief Complaint   Abdominal Pain (pancreatic cancer)   History of Present Illness   Travis Hawkins is a 43 y.o. year-old male with a history of pancreatic cancer presenting to the ED with chief complaint of abdominal pain.  Patient has been having chronic upper abdominal pain for months related to his pancreatic cancer.  Worse for the past few days.  Was supposed to see palliative care today but missed his transportation opportunity.  Has Dilaudid pills at home but they are not working.  Denies fever, no vomiting, no chest pain or shortness of breath.  Shaking chills yesterday.  Denies dysuria.  Pain is severe, constant, no exacerbating relieving factors.  Trouble eating or drinking because of the pain.  Review of Systems  A complete 10 system review of systems was obtained and all systems are negative except as noted in the HPI and PMH.   Patient's Health History    Past Medical History:  Diagnosis Date  . Biliary obstruction   . Depression   . Mood disorder [facility])     Past Surgical History:  Procedure Laterality Date  . CHOLECYSTECTOMY    . ENDOSCOPIC RETROGRADE CHOLANGIOPANCREATOGRAPHY (ERCP) WITH PROPOFOL N/A 03/11/2017   Procedure: ENDOSCOPIC RETROGRADE CHOLANGIOPANCREATOGRAPHY (ERCP) WITH PROPOFOL;  Surgeon: Lucilla Lame, MD;  Location: [facility] ENDOSCOPY;  Service: Endoscopy;  Laterality: N/A;  . ERCP N/A 03/19/2017   Procedure: ENDOSCOPIC RETROGRADE CHOLANGIOPANCREATOGRAPHY (ERCP);  Surgeon: Lucilla Lame, MD;  Location: [facility] ENDOSCOPY;  Service: Endoscopy;  Laterality: N/A;  . ERCP N/A 06/25/2017   Procedure: ENDOSCOPIC RETROGRADE CHOLANGIOPANCREATOGRAPHY (ERCP);  Surgeon: Lucilla Lame, MD;  Location: [facility] ENDOSCOPY;  Service: Endoscopy;  Laterality: N/A;  . ERCP N/A 07/16/2017   Procedure: ENDOSCOPIC RETROGRADE CHOLANGIOPANCREATOGRAPHY (ERCP);  Surgeon: Lucilla Lame, MD;  Location: [facility] ENDOSCOPY;  Service: Endoscopy;  Laterality: N/A;  . none      Family History  Problem Relation Age of Onset  . Cancer Maternal Aunt   . Breast cancer Maternal Grandmother   . Lung cancer Maternal Grandfather   . Diabetes Mellitus II Mother     Social History   Socioeconomic History  . Marital status: Divorced    Spouse name: Not on file  . Number of children: Not on file  . Years of education: Not on file  . Highest education level: Not on file  Occupational History  . Not on file  Social Needs  . Financial resource strain: Not on file  . Food insecurity:    Worry: Not on file    Inability: Not on file  . Transportation needs:    Medical: Not on file    Non-medical: Not on file  Tobacco Use  . Smoking status: Former Smoker    Packs/day: 1.00    Years: 18.00    Pack years: 18.00    Types: E-cigarettes  . Smokeless tobacco: Never Used  Substance and Sexual Activity  . Alcohol use: No    Frequency: Never    Comment: Occasional wine about every 3-4 months  . Drug use: No  . Sexual activity: Yes    Partners: Female  Lifestyle  . Physical activity:    Days per week: Not on file    Minutes per session: Not on file  . Stress: Not on file  Relationships  . Social connections:    Talks on phone: Not on file  Gets together: Not on file    Attends religious service: Not on file    Active member of club or organization: Not on file    Attends meetings of clubs or organizations: Not on file    Relationship status: Not on file  . Intimate partner violence:    Fear of current or ex partner: Not on file    Emotionally abused: Not on file    Physically abused: Not on file    Forced sexual activity: Not on file  Other Topics Concern  . Not on file  Social History Narrative  . Not on file     Physical Exam  Vital Signs and Nursing Notes reviewed Vitals:   06/06/18 0430 06/06/18 0500  BP: 114/79 112/76  Pulse: 86 92  Resp: 15 17    Temp:    SpO2: 99% 98%    CONSTITUTIONAL: Well-appearing, NAD NEURO:  Alert and oriented x 3, no focal deficits EYES:  eyes equal and reactive ENT/NECK:  no LAD, no JVD CARDIO: Regular rate, well-perfused, normal S1 and S2 PULM:  CTAB no wheezing or rhonchi GI/GU:  normal bowel sounds, non-distended, mild diffuse tenderness to palpation MSK/SPINE:  No gross deformities, no edema SKIN:  no rash, atraumatic PSYCH:  Appropriate speech and behavior  Diagnostic and Interventional Summary    EKG Interpretation  Date/Time:  Friday June 06 2018 05:22:32 EST Ventricular Rate:  86 PR Interval:  150 QRS Duration: 86 QT Interval:  374 QTC Calculation: 447 R Axis:   30 Text Interpretation:  Normal sinus rhythm Normal ECG Confirmed by Gerlene Fee [phone]) on 06/06/2018 6:08:42 AM      Labs Reviewed  CBC - Abnormal; Notable for the following components:      Result Value   RBC 3.15 (*)    Hemoglobin 9.7 (*)    HCT 31.3 (*)    All other components within normal limits  COMPREHENSIVE METABOLIC PANEL - Abnormal; Notable for the following components:   Glucose, Bld 219 (*)    Calcium 8.4 (*)    All other components within normal limits  LIPASE, BLOOD - Abnormal; Notable for the following components:   Lipase 93 (*)    All other components within normal limits  LACTIC ACID, PLASMA  URINALYSIS, ROUTINE W REFLEX MICROSCOPIC  I-STAT TROPONIN, ED    No orders to display    Medications  HYDROmorphone (DILAUDID) injection 0.5 mg (has no administration in time range)  HYDROmorphone (DILAUDID) injection 1 mg (1 mg Intravenous Given 06/06/18 0418)  sodium chloride 0.9 % bolus 1,000 mL (0 mLs Intravenous Stopped 06/06/18 0531)  HYDROmorphone (DILAUDID) injection 1 mg (1 mg Intravenous Given 06/06/18 0536)  famotidine (PEPCID) IVPB 20 mg premix (0 mg Intravenous Stopped 06/06/18 0633)  alum & mag hydroxide-simeth (MAALOX/MYLANTA) 200-200-20 MG/5ML suspension 30 mL (30 mLs Oral Given 06/06/18 0534)     And  lidocaine (XYLOCAINE) 2 % viscous mouth solution 15 mL (15 mLs Oral Given 06/06/18 0534)     Procedures Critical Care  ED Course and Medical Decision Making  I have reviewed the triage vital signs and the nursing notes.  Pertinent labs & imaging results that were available during my care of the patient were reviewed by me and considered in my medical decision making (see below for details).  Favoring uncontrolled chronic pain related to cancer in this 43 year old male with history of pancreatic cancer.  May need admission for pain control.  Labs pending.  Patient explains he had a CT  of his abdomen 1 week ago, currently without indication for reimaging.  Labs unremarkable.  Patient is feeling better after medications listed above.  Still in some pain.  Options discussed with patient.  He is followed by [facility].  Best for him to call the palliative care doctors to try to reschedule his missed appointment soon as possible.  After the discussed management above, the patient was determined to be safe for discharge.  The patient was in agreement with this plan and all questions regarding their care were answered.  ED return precautions were discussed and the patient will return to the ED with any significant worsening of condition.  Barth Kirks. Sedonia Small, MD [place] [email or web address]  Final Clinical Impressions(s) / ED Diagnoses     ICD-10-CM   1. Epigastric pain R10.13   2. Malignant neoplasm of pancreas, unspecified location of malignancy [facility]) C25.9     ED Discharge Orders    None         Maudie Flakes, MD 06/06/18 [phone]

## 2018-06-06 NOTE — ED Notes (Signed)
EKG given to Dr. Sedonia Small for review

## 2018-06-06 NOTE — ED Triage Notes (Signed)
Pt reports that he has stage 3 pancreatic cancer and was supposed to go to pallative care today, but missed his appointment. He is complaining of 10/10 pain in his upper abdomen. He states that IV dilaudid is the only thing that works.

## 2018-06-06 NOTE — ED Notes (Signed)
Pt's port accessed, infusing normal saline.

## 2018-06-06 NOTE — Discharge Instructions (Signed)
You were evaluated in the Emergency Department and after careful evaluation, we did not find any emergent condition requiring admission or further testing in the hospital.  Please follow up with your palliative care doctors at [facility] for further pain management options.  Please return to the Emergency Department if you experience any worsening of your condition.  We encourage you to follow up with a primary care provider.  Thank you for allowing [place] to be a part of your care.

## 2018-07-28 ENCOUNTER — Other Ambulatory Visit: Payer: Self-pay | Admitting: Psychiatry

## 2018-08-03 ENCOUNTER — Other Ambulatory Visit: Payer: Self-pay

## 2018-08-03 ENCOUNTER — Emergency Department
Admission: EM | Admit: 2018-08-03 | Discharge: 2018-08-04 | Disposition: A | Discharge status: Home / Self Care | Payer: Medicaid Other | Attending: Emergency Medicine | Admitting: Emergency Medicine

## 2018-08-03 DIAGNOSIS — R1031 Right lower quadrant pain: Secondary | ICD-10-CM | POA: Insufficient documentation

## 2018-08-03 DIAGNOSIS — E119 Type 2 diabetes mellitus without complications: Secondary | ICD-10-CM | POA: Insufficient documentation

## 2018-08-03 DIAGNOSIS — R112 Nausea with vomiting, unspecified: Secondary | ICD-10-CM | POA: Diagnosis present

## 2018-08-03 DIAGNOSIS — R110 Nausea: Secondary | ICD-10-CM

## 2018-08-03 DIAGNOSIS — T451X5A Adverse effect of antineoplastic and immunosuppressive drugs, initial encounter: Secondary | ICD-10-CM | POA: Diagnosis not present

## 2018-08-03 DIAGNOSIS — Y69 Unspecified misadventure during surgical and medical care: Secondary | ICD-10-CM | POA: Insufficient documentation

## 2018-08-03 DIAGNOSIS — C250 Malignant neoplasm of head of pancreas: Secondary | ICD-10-CM | POA: Diagnosis not present

## 2018-08-03 DIAGNOSIS — Z87891 Personal history of nicotine dependence: Secondary | ICD-10-CM | POA: Insufficient documentation

## 2018-08-03 DIAGNOSIS — Z79899 Other long term (current) drug therapy: Secondary | ICD-10-CM | POA: Diagnosis not present

## 2018-08-03 DIAGNOSIS — R109 Unspecified abdominal pain: Secondary | ICD-10-CM

## 2018-08-03 DIAGNOSIS — R1032 Left lower quadrant pain: Secondary | ICD-10-CM | POA: Diagnosis not present

## 2018-08-03 DIAGNOSIS — T887XXA Unspecified adverse effect of drug or medicament, initial encounter: Secondary | ICD-10-CM | POA: Diagnosis not present

## 2018-08-03 LAB — URINALYSIS, COMPLETE (UACMP) WITH MICROSCOPIC
Bacteria, UA: NONE SEEN
Bilirubin Urine: NEGATIVE
Glucose, UA: NEGATIVE mg/dL
Hgb urine dipstick: NEGATIVE
Ketones, ur: NEGATIVE mg/dL
Leukocytes,Ua: NEGATIVE
Nitrite: NEGATIVE
Protein, ur: NEGATIVE mg/dL
Specific Gravity, Urine: 1.012 (ref 1.005–1.030)
pH: 7 (ref 5.0–8.0)

## 2018-08-03 LAB — COMPREHENSIVE METABOLIC PANEL
ALT: 16 U/L (ref 0–44)
AST: 22 U/L (ref 15–41)
Albumin: 3.3 g/dL — ABNORMAL LOW (ref 3.5–5.0)
Alkaline Phosphatase: 82 U/L (ref 38–126)
Anion gap: 8 (ref 5–15)
BUN: 8 mg/dL (ref 6–20)
CO2: 28 mmol/L (ref 22–32)
Calcium: 8.3 mg/dL — ABNORMAL LOW (ref 8.9–10.3)
Chloride: 101 mmol/L (ref 98–111)
Creatinine, Ser: 0.43 mg/dL — ABNORMAL LOW (ref 0.61–1.24)
GFR calc Af Amer: >60 mL/min (ref >60)
GFR calc non Af Amer: >60 mL/min (ref >60)
Glucose, Bld: 198 mg/dL — ABNORMAL HIGH (ref 70–99)
Potassium: 3.7 mmol/L (ref 3.5–5.1)
Sodium: 137 mmol/L (ref 135–145)
Total Bilirubin: 0.7 mg/dL (ref 0.3–1.2)
Total Protein: 6.7 g/dL (ref 6.5–8.1)

## 2018-08-03 LAB — CBC
HCT: 29.5 % — ABNORMAL LOW (ref 39.0–52.0)
Hemoglobin: 9.2 g/dL — ABNORMAL LOW (ref 13.0–17.0)
MCH: 29.4 pg (ref 26.0–34.0)
MCHC: 31.2 g/dL (ref 30.0–36.0)
MCV: 94.2 fL (ref 80.0–100.0)
Platelets: 202 10*3/uL (ref 150–400)
RBC: 3.13 MIL/uL — ABNORMAL LOW (ref 4.22–5.81)
RDW: 15.9 % — ABNORMAL HIGH (ref 11.5–15.5)
WBC: 1.1 10*3/uL — CL (ref 4.0–10.5)
nRBC: 0 % (ref 0.0–0.2)

## 2018-08-03 LAB — LIPASE, BLOOD: Lipase: 62 U/L — ABNORMAL HIGH (ref 11–51)

## 2018-08-03 MED ORDER — HYDROMORPHONE HCL 1 MG/ML IJ SOLN
0.5000 mg | Freq: Once | INTRAMUSCULAR | Status: AC
  Administered 2018-08-03: 0.5 mg via INTRAVENOUS
  Filled 2018-08-03: qty 1

## 2018-08-03 MED ORDER — SODIUM CHLORIDE 0.9 % IV BOLUS
1000.0000 mL | Freq: Once | INTRAVENOUS | Status: AC
  Administered 2018-08-03: 1000 mL via INTRAVENOUS

## 2018-08-03 MED ORDER — PROMETHAZINE HCL 25 MG/ML IJ SOLN
25.0000 mg | Freq: Once | INTRAMUSCULAR | Status: AC
  Administered 2018-08-03: 25 mg via INTRAVENOUS
  Filled 2018-08-03: qty 1

## 2018-08-03 MED ORDER — HYDROMORPHONE HCL 1 MG/ML IJ SOLN
1.0000 mg | Freq: Once | INTRAMUSCULAR | Status: AC
  Administered 2018-08-03: 1 mg via INTRAVENOUS
  Filled 2018-08-03: qty 1

## 2018-08-03 NOTE — ED Notes (Signed)
CRITICAL LAB: WBC is 1.1, [employer], [place], [place] notified, orders received

## 2018-08-03 NOTE — ED Provider Notes (Signed)
[facility] Emergency Department Provider Note  ____________________________________________  Time seen: Approximately 9:03 PM  I have reviewed the triage vital signs and the nursing notes.   HISTORY  Chief Complaint Abdominal Pain and Back Pain    HPI Travis Hawkins is a 43 y.o. male with a history of diabetes, presents to the emergency department with abdominal pain in the right and left lower quadrants and postoperative nausea and vomiting.  Patient is currently being treated for pancreatic cancer at [facility] with chemotherapy.  Patient reports that he has been taking Phenergan at home but it is not controlling his nausea.  Patient was seen and evaluated on 08/01/2018 by [facility].  Patient had reassuring basic labs during that encounter.  An abdominal x-ray was obtained and there was no evidence of free air.  Patient received supplemental fluids and Dilaudid.  Patient reports that he was concerned about dehydration and that he did not receive enough supplemental fluids at prior emergency department encounter.  Patient reports that he spoke to his oncologist tonight and oncologist was supposedly concerned about dehydration and recommended seeking care at local emergency department for supplemental fluids.  Patient denies fever at home.  He denies diarrhea and states that he has been taking stool softeners as directed and has had a bowel movement since 08/01/2018.  Patient has been taking Percocet at home for chronic pain and states that his pain has been well managed with aforementioned medication.       Past Medical History:  Diagnosis Date  . Biliary obstruction   . Depression   . Mood disorder [facility])     Patient Active Problem List   Diagnosis Date Noted  . Bipolar 1 disorder, manic, moderate ([place]) 05/19/2018  . Bipolar affective disorder, manic, severe ([place]) 05/16/2018  . Diabetes mellitus ([place]) 05/16/2018  . Chronic pancreatitis ([place]) 05/16/2018  . Hematemesis  with nausea 04/21/2018  . Bipolar affective disorder, manic, severe, with psychotic behavior ([place]) 01/02/2018  . Genetic testing 11/28/2017  . Malignant tumor of head of pancreas ([place]) 09/05/2017  . Bipolar disorder ([place]) 07/22/2017  . Obstruction of bile duct   . Encounter for fit/adjst of GI appliance and device   . Presence of other specified functional implants   . Cholestasis 04/03/2017  . Elevated LFTs 04/03/2017  . Jaundice, hepatocellular   . Encounter for fitting and adjustment of other gastrointestinal appliance and device   . Acute acalculous cholecystitis 03/13/2017  . Acute pancreatitis   . Transaminitis   . Calculus of bile duct without cholecystitis and without obstruction   . Other specified diseases of biliary tract   . Elevated liver enzymes   . Biliary obstruction 03/09/2017    Past Surgical History:  Procedure Laterality Date  . CHOLECYSTECTOMY    . ENDOSCOPIC RETROGRADE CHOLANGIOPANCREATOGRAPHY (ERCP) WITH PROPOFOL N/A 03/11/2017   Procedure: ENDOSCOPIC RETROGRADE CHOLANGIOPANCREATOGRAPHY (ERCP) WITH PROPOFOL;  Surgeon: Lucilla Lame, MD;  Location: [facility] ENDOSCOPY;  Service: Endoscopy;  Laterality: N/A;  . ERCP N/A 03/19/2017   Procedure: ENDOSCOPIC RETROGRADE CHOLANGIOPANCREATOGRAPHY (ERCP);  Surgeon: Lucilla Lame, MD;  Location: [facility] ENDOSCOPY;  Service: Endoscopy;  Laterality: N/A;  . ERCP N/A 06/25/2017   Procedure: ENDOSCOPIC RETROGRADE CHOLANGIOPANCREATOGRAPHY (ERCP);  Surgeon: Lucilla Lame, MD;  Location: [facility] ENDOSCOPY;  Service: Endoscopy;  Laterality: N/A;  . ERCP N/A 07/16/2017   Procedure: ENDOSCOPIC RETROGRADE CHOLANGIOPANCREATOGRAPHY (ERCP);  Surgeon: Lucilla Lame, MD;  Location: [facility] ENDOSCOPY;  Service: Endoscopy;  Laterality: N/A;  . none      Prior  to Admission medications   Medication Sig Start Date End Date Taking? Authorizing Provider  bisacodyl (DULCOLAX) 5 MG EC tablet Take 1 tablet (5 mg total) by mouth daily as needed for moderate  constipation. 05/22/18   Clapacs, Madie Reno, MD  HYDROmorphone (DILAUDID) 4 MG tablet Take 1 tablet (4 mg total) by mouth every 4 (four) hours as needed for severe pain. 05/25/18   Clapacs, Madie Reno, MD  lipase/protease/amylase (CREON) 36000 UNITS CPEP capsule Take 1 capsule (36,000 Units total) by mouth 4 (four) times daily. Patient taking differently: Take 144,000 Units by mouth 3 (three) times daily.  05/22/18   Clapacs, Madie Reno, MD  lithium carbonate (ESKALITH) 450 MG CR tablet Take 2 tablets (900 mg total) by mouth at bedtime. 05/22/18   Clapacs, Madie Reno, MD  loratadine (CLARITIN) 10 MG tablet Take 1 tablet (10 mg total) by mouth daily. 05/22/18   Clapacs, Madie Reno, MD  OLANZapine (ZYPREXA) 15 MG tablet Take 1 tablet (15 mg total) by mouth at bedtime. 05/22/18   Clapacs, Madie Reno, MD  senna (SENOKOT) 8.6 MG TABS tablet Take 1 tablet (8.6 mg total) by mouth at bedtime. 05/22/18   Clapacs, Madie Reno, MD  temazepam (RESTORIL) 15 MG capsule Take 1 capsule (15 mg total) by mouth at bedtime as needed for sleep. 05/22/18   Clapacs, Madie Reno, MD  temazepam (RESTORIL) 15 MG capsule Take 1 capsule (15 mg total) by mouth at bedtime. Patient not taking: Reported on 06/06/2018 05/22/18   Clapacs, Madie Reno, MD    Allergies Benztropine and Lamotrigine  Family History  Problem Relation Age of Onset  . Cancer Maternal Aunt   . Breast cancer Maternal Grandmother   . Lung cancer Maternal Grandfather   . Diabetes Mellitus II Mother     Social History Social History   Tobacco Use  . Smoking status: Former Smoker    Packs/day: 1.00    Years: 18.00    Pack years: 18.00    Types: E-cigarettes  . Smokeless tobacco: Never Used  Substance Use Topics  . Alcohol use: No    Frequency: Never    Comment: Occasional wine about every 3-4 months  . Drug use: No     Review of Systems  Constitutional: No fever/chills Eyes: No visual changes. No discharge ENT: No upper respiratory complaints. Cardiovascular: no chest  pain. Respiratory: no cough. No SOB. Gastrointestinal: Patient has right and left lower quadrant abdominal pain. Patient has nausea. No diarrhea.  No constipation. Genitourinary: Negative for dysuria. No hematuria Musculoskeletal: Negative for musculoskeletal pain. Skin: Negative for rash, abrasions, lacerations, ecchymosis. Neurological: Negative for headaches, focal weakness or numbness.   ____________________________________________   PHYSICAL EXAM:  VITAL SIGNS: ED Triage Vitals  Enc Vitals Group     BP 08/03/18 1921 (!) 87/57     Pulse Rate 08/03/18 1918 98     Resp 08/03/18 1918 16     Temp 08/03/18 1918 98.6 F (37 C)     Temp Source 08/03/18 1918 Oral     SpO2 08/03/18 1918 100 %     Weight 08/03/18 1916 142 lb (64.4 kg)     Height 08/03/18 1916 5\' 8"  (1.727 m)     Head Circumference --      Peak Flow --      Pain Score 08/03/18 1915 8     Pain Loc --      Pain Edu? --      Excl. in [place]? --  Constitutional: Alert and oriented. Well appearing and in no acute distress. Eyes: Conjunctivae are normal. PERRL. EOMI. Head: Atraumatic. ENT:      Ears: TMs are pearly.       Nose: No congestion/rhinnorhea.      Mouth/Throat: Mucous membranes are moist.  Neck: No stridor.  No cervical spine tenderness to palpation. Hematological/Lymphatic/Immunilogical: No cervical lymphadenopathy. Cardiovascular: Normal rate, regular rhythm. Normal S1 and S2.  Good peripheral circulation. Respiratory: Normal respiratory effort without tachypnea or retractions. Lungs CTAB. Good air entry to the bases with no decreased or absent breath sounds. Gastrointestinal: Patient has tenderness to palpation in right and left lower abdominal quadrants.  No guarding or rigidity.  Bowel sounds positive in all 4 quadrants. Musculoskeletal: Full range of motion to all extremities. No gross deformities appreciated. Neurologic:  Normal speech and language. No gross focal neurologic deficits are  appreciated.  Skin:  Skin is warm, dry and intact. No rash noted. Psychiatric: Mood and affect are normal. Speech and behavior are normal. Patient exhibits appropriate insight and judgement.   ____________________________________________   LABS (all labs ordered are listed, but only abnormal results are displayed)  Labs Reviewed  LIPASE, BLOOD - Abnormal; Notable for the following components:      Result Value   Lipase 62 (*)    All other components within normal limits  COMPREHENSIVE METABOLIC PANEL - Abnormal; Notable for the following components:   Glucose, Bld 198 (*)    Creatinine, Ser 0.43 (*)    Calcium 8.3 (*)    Albumin 3.3 (*)    All other components within normal limits  CBC - Abnormal; Notable for the following components:   WBC 1.1 (*)    RBC 3.13 (*)    Hemoglobin 9.2 (*)    HCT 29.5 (*)    RDW 15.9 (*)    All other components within normal limits  URINALYSIS, COMPLETE (UACMP) WITH MICROSCOPIC - Abnormal; Notable for the following components:   Color, Urine YELLOW (*)    APPearance CLEAR (*)    All other components within normal limits   ____________________________________________  EKG   ____________________________________________  RADIOLOGY   No results found.  ____________________________________________    PROCEDURES  Procedure(s) performed:    Procedures    Medications  sodium chloride 0.9 % bolus 1,000 mL (0 mLs Intravenous Stopped 08/03/18 2148)  promethazine (PHENERGAN) injection 25 mg (25 mg Intravenous Given 08/03/18 2054)  HYDROmorphone (DILAUDID) injection 0.5 mg (0.5 mg Intravenous Given 08/03/18 2052)  sodium chloride 0.9 % bolus 1,000 mL (1,000 mLs Intravenous New Bag/Given 08/03/18 2205)  HYDROmorphone (DILAUDID) injection 0.5 mg (0.5 mg Intravenous Given 08/03/18 2205)     ____________________________________________   INITIAL IMPRESSION / ASSESSMENT AND PLAN / ED COURSE  Pertinent labs & imaging results that were available  during my care of the patient were reviewed by me and considered in my medical decision making (see chart for details).  Review of the [place] CSRS was performed in accordance of the [place] prior to dispensing any controlled drugs.           Assessment and Plan:  Abdominal discomfort: Dehydration: Patient presents to the emergency department with abdominal discomfort in the right and left lower abdominal quadrants that has been chronic in nature and chemotherapy induced nausea and vomiting. Patient is primarily presenting to the emergency department with concern for dehydration.  Patient denies acute changes in abdominal pain. On physical exam, patient's abdomen was mildly tender without guarding or rigidity.  Patient  had reassuring labs on 08/01/2018. Leukopenia and anemia were identified on CBC during this emergency department encounter but basic labs were otherwise reassuring. Patient received dilaudid and phenergan in the emergency department as well as supplemental fluids. Patient's case was discussed with Dr. Cherylann Banas who assumed patient care at time of handoff.   ____________________________________________  FINAL CLINICAL IMPRESSION(S) / ED DIAGNOSES  Final diagnoses:  Chemotherapy-induced nausea  Abdominal pain, unspecified abdominal location      NEW MEDICATIONS STARTED DURING THIS VISIT:  ED Discharge Orders    None          This chart was dictated using voice recognition software/Dragon. Despite best efforts to proofread, errors can occur which can change the meaning. Any change was purely unintentional.    Lannie Fields, PA-C 08/03/18 2242    Arta Silence, MD 08/03/18 2303

## 2018-08-03 NOTE — ED Provider Notes (Signed)
-----------------------------------------   11:33 PM on 08/03/2018 -----------------------------------------  The patient reports improvement in his pain and nausea although he is still not feeling as well as his baseline.  I have ordered another dose of Dilaudid and Phenergan and I am signing the patient out to the oncoming physician Dr. Beather Arbour.  I anticipate likely discharge home.   Arta Silence, MD 08/03/18 [phone]

## 2018-08-03 NOTE — ED Notes (Addendum)
Pt given additional water, pt able to drink 1 cup without emesis  TV on, lights dimmed, pt eyes closed

## 2018-08-03 NOTE — ED Notes (Signed)
Pt requesting dilaudid for for pain, EDP notified

## 2018-08-03 NOTE — ED Notes (Signed)
Pt given PO trial of ice water as requested, falling asleep during conversation

## 2018-08-03 NOTE — ED Notes (Signed)
ED Provider at bedside. 

## 2018-08-03 NOTE — ED Triage Notes (Signed)
Patient reports abdominal pain, back pain and decreased appetite, and when eats vomits.  States spoke with oncologist and told possible dehydration.

## 2018-08-04 NOTE — ED Provider Notes (Signed)
-----------------------------------------   12:34 AM on 08/04/2018 -----------------------------------------  Pain and nausea are significantly improved after additional dose of Dilaudid and Phenergan.  Wife to pick him up.  Strict return precautions given.  Patient verbalizes understanding and agrees with plan of care.   Paulette Blanch, MD 08/04/18 Delrae Rend

## 2018-08-04 NOTE — ED Notes (Signed)
Wife called for pt pick up

## 2018-08-04 NOTE — ED Notes (Signed)
No peripheral IV placed this visit.   Discharge instructions reviewed with patient. Questions fielded by this RN. Patient verbalizes understanding of instructions. Patient discharged home in stable condition per sung. No acute distress noted at time of discharge.

## 2018-08-04 NOTE — Discharge Instructions (Addendum)
Clear liquids x 12 hours, then bland diet x 3 days, then slowly advance diet as tolerated. Return to the ER for worsening symptoms, persistent vomiting, difficulty breathing or other concerns.

## 2018-08-11 ENCOUNTER — Emergency Department
Admission: EM | Admit: 2018-08-11 | Discharge: 2018-08-11 | Disposition: A | Discharge status: Home / Self Care | Payer: Medicaid Other | Attending: Emergency Medicine | Admitting: Emergency Medicine

## 2018-08-11 ENCOUNTER — Emergency Department: Payer: Medicaid Other

## 2018-08-11 ENCOUNTER — Encounter: Payer: Self-pay | Admitting: Intensive Care

## 2018-08-11 ENCOUNTER — Other Ambulatory Visit: Payer: Self-pay

## 2018-08-11 DIAGNOSIS — F17290 Nicotine dependence, other tobacco product, uncomplicated: Secondary | ICD-10-CM | POA: Insufficient documentation

## 2018-08-11 DIAGNOSIS — C250 Malignant neoplasm of head of pancreas: Secondary | ICD-10-CM | POA: Insufficient documentation

## 2018-08-11 DIAGNOSIS — Z79899 Other long term (current) drug therapy: Secondary | ICD-10-CM | POA: Insufficient documentation

## 2018-08-11 DIAGNOSIS — R109 Unspecified abdominal pain: Secondary | ICD-10-CM | POA: Insufficient documentation

## 2018-08-11 DIAGNOSIS — E119 Type 2 diabetes mellitus without complications: Secondary | ICD-10-CM | POA: Diagnosis not present

## 2018-08-11 HISTORY — DX: Type 2 diabetes mellitus without complications: E11.9

## 2018-08-11 HISTORY — DX: Malignant neoplasm of pancreas, unspecified: C25.9

## 2018-08-11 LAB — URINALYSIS, COMPLETE (UACMP) WITH MICROSCOPIC
Bacteria, UA: NONE SEEN
Bilirubin Urine: NEGATIVE
Glucose, UA: NEGATIVE mg/dL
Hgb urine dipstick: NEGATIVE
Ketones, ur: NEGATIVE mg/dL
Leukocytes,Ua: NEGATIVE
Nitrite: NEGATIVE
Protein, ur: NEGATIVE mg/dL
Specific Gravity, Urine: 1.018 (ref 1.005–1.030)
Squamous Epithelial / HPF: NONE SEEN (ref 0–5)
pH: 7 (ref 5.0–8.0)

## 2018-08-11 LAB — CBC
HCT: 24.6 % — ABNORMAL LOW (ref 39.0–52.0)
Hemoglobin: 7.7 g/dL — ABNORMAL LOW (ref 13.0–17.0)
MCH: 30.1 pg (ref 26.0–34.0)
MCHC: 31.3 g/dL (ref 30.0–36.0)
MCV: 96.1 fL (ref 80.0–100.0)
Platelets: 180 10*3/uL (ref 150–400)
RBC: 2.56 MIL/uL — ABNORMAL LOW (ref 4.22–5.81)
RDW: 16.4 % — ABNORMAL HIGH (ref 11.5–15.5)
WBC: 7.6 10*3/uL (ref 4.0–10.5)
nRBC: 0 % (ref 0.0–0.2)

## 2018-08-11 LAB — COMPREHENSIVE METABOLIC PANEL
ALT: 14 U/L (ref 0–44)
AST: 15 U/L (ref 15–41)
Albumin: 3.2 g/dL — ABNORMAL LOW (ref 3.5–5.0)
Alkaline Phosphatase: 92 U/L (ref 38–126)
Anion gap: 6 (ref 5–15)
BUN: 9 mg/dL (ref 6–20)
CO2: 29 mmol/L (ref 22–32)
Calcium: 8.6 mg/dL — ABNORMAL LOW (ref 8.9–10.3)
Chloride: 102 mmol/L (ref 98–111)
Creatinine, Ser: 0.54 mg/dL — ABNORMAL LOW (ref 0.61–1.24)
GFR calc Af Amer: >60 mL/min (ref >60)
GFR calc non Af Amer: >60 mL/min (ref >60)
Glucose, Bld: 107 mg/dL — ABNORMAL HIGH (ref 70–99)
Potassium: 3.8 mmol/L (ref 3.5–5.1)
Sodium: 137 mmol/L (ref 135–145)
Total Bilirubin: 0.4 mg/dL (ref 0.3–1.2)
Total Protein: 6.7 g/dL (ref 6.5–8.1)

## 2018-08-11 LAB — LIPASE, BLOOD: Lipase: 60 U/L — ABNORMAL HIGH (ref 11–51)

## 2018-08-11 MED ORDER — HEPARIN SOD (PORK) LOCK FLUSH 10 UNIT/ML IV SOLN: 10.0000 [IU] | Freq: Once | INTRAVENOUS | Status: DC

## 2018-08-11 MED ORDER — SODIUM CHLORIDE 0.9 % IV BOLUS
1000.0000 mL | Freq: Once | INTRAVENOUS | Status: AC
  Administered 2018-08-11: 1000 mL via INTRAVENOUS

## 2018-08-11 MED ORDER — OXYCODONE-ACETAMINOPHEN 5-325 MG PO TABS
1.0000 | ORAL_TABLET | Freq: Once | ORAL | Status: AC
  Administered 2018-08-11: 1 via ORAL
  Filled 2018-08-11: qty 1

## 2018-08-11 MED ORDER — HYDROMORPHONE HCL 1 MG/ML IJ SOLN
1.0000 mg | Freq: Once | INTRAMUSCULAR | Status: AC
  Administered 2018-08-11: 16:00:00 1 mg via INTRAVENOUS
  Filled 2018-08-11: qty 1

## 2018-08-11 MED ORDER — SODIUM CHLORIDE 0.9 % IV BOLUS
1000.0000 mL | Freq: Once | INTRAVENOUS | Status: AC
  Administered 2018-08-11: 16:00:00 1000 mL via INTRAVENOUS

## 2018-08-11 MED ORDER — IOHEXOL 300 MG/ML  SOLN
100.0000 mL | Freq: Once | INTRAMUSCULAR | Status: AC | PRN
  Administered 2018-08-11: 17:00:00 100 mL via INTRAVENOUS

## 2018-08-11 MED ORDER — IOHEXOL 240 MG/ML SOLN
25.0000 mL | INTRAMUSCULAR | Status: AC
  Administered 2018-08-11: 50 mL via ORAL

## 2018-08-11 MED ORDER — HEPARIN SOD (PORK) LOCK FLUSH 100 UNIT/ML IV SOLN
500.0000 [IU] | Freq: Once | INTRAVENOUS | Status: AC
  Administered 2018-08-11: 20:00:00 500 [IU] via INTRAVENOUS
  Filled 2018-08-11: qty 5

## 2018-08-11 NOTE — ED Triage Notes (Signed)
Patient c/o abd pain with constipation and N/V. Last BM couple days ago. Last had chemo March 31st. Patient reports "I know I am dehydrated and constipated because I am on percocet and in a lot of pain" Patient A&O x4. Ambulatory with no problems

## 2018-08-11 NOTE — ED Notes (Signed)
Patient transported to CT 

## 2018-08-11 NOTE — ED Notes (Signed)
Pt st his wife is his ride home.

## 2018-08-11 NOTE — Discharge Instructions (Addendum)
Return to the emergency room for any new or worrisome symptoms including bleeding, lightheadedness, worsening pain fever vomiting, you change your mind about being admitted, or if you feel worse in any way.  Take your home pain medications.  Please follow closely with your oncologist tomorrow.  Oncologist think that you do not need to get a CT scan tomorrow as we did a CT scan here and have sent it to them.  Your hemoglobin is somewhat low here, we have discussed that with the oncologist, they will address it tomorrow with you.

## 2018-08-11 NOTE — ED Notes (Signed)
ED Provider, McShane at bedside to update pt.

## 2018-08-11 NOTE — ED Notes (Signed)
Pt vomited contrast. MD made aware. Pt denies nausea after vomiting. CT contacted to scan without.

## 2018-08-11 NOTE — ED Notes (Signed)
Patient transported to X-ray 

## 2018-08-11 NOTE — ED Provider Notes (Addendum)
[facility] Emergency Department Provider Note  ____________________________________________   I have reviewed the triage vital signs and the nursing notes. Where available I have reviewed prior notes and, if possible and indicated, outside hospital notes.    HISTORY  Chief Complaint Abdominal Pain    HPI Travis Hawkins is a 43 y.o. male  Who is under active care for pancreatic cancer, last CT scan was apparently in February of this year which showed a small pseudocyst and pancreatic swelling with colitis.  Patient has a history of recurrent constipation is getting chemotherapy, last chemotherapy was a few weeks ago, states that he feels somewhat dehydrated even though he is not vomiting and he feels somewhat constipated from his pain medication, he wants me to evaluate him for constipation and dehydration.  He refuses IV he wants to have his port accessed.  He also would like to have some pain medication because his chronic pain is getting worse.  Dates that the pains on the left side, is been there for a long time.  He states it may be worse over the last couple days he is not sure.  Is not had any rectal bleeding and he is not out of his home pain medications.  States his last bowel movement was normal but a couple days ago.    Past Medical History:  Diagnosis Date  . Biliary obstruction   . Depression   . Diabetes mellitus without complication ([place])   . Mood disorder ([place])   . Pancreatic cancer [facility])     Patient Active Problem List   Diagnosis Date Noted  . Bipolar 1 disorder, manic, moderate ([place]) 05/19/2018  . Bipolar affective disorder, manic, severe ([place]) 05/16/2018  . Diabetes mellitus ([place]) 05/16/2018  . Chronic pancreatitis ([place]) 05/16/2018  . Hematemesis with nausea 04/21/2018  . Bipolar affective disorder, manic, severe, with psychotic behavior ([place]) 01/02/2018  . Genetic testing 11/28/2017  . Malignant tumor of head of pancreas ([place])  09/05/2017  . Bipolar disorder ([place]) 07/22/2017  . Obstruction of bile duct   . Encounter for fit/adjst of GI appliance and device   . Presence of other specified functional implants   . Cholestasis 04/03/2017  . Elevated LFTs 04/03/2017  . Jaundice, hepatocellular   . Encounter for fitting and adjustment of other gastrointestinal appliance and device   . Acute acalculous cholecystitis 03/13/2017  . Acute pancreatitis   . Transaminitis   . Calculus of bile duct without cholecystitis and without obstruction   . Other specified diseases of biliary tract   . Elevated liver enzymes   . Biliary obstruction 03/09/2017    Past Surgical History:  Procedure Laterality Date  . CHOLECYSTECTOMY    . ENDOSCOPIC RETROGRADE CHOLANGIOPANCREATOGRAPHY (ERCP) WITH PROPOFOL N/A 03/11/2017   Procedure: ENDOSCOPIC RETROGRADE CHOLANGIOPANCREATOGRAPHY (ERCP) WITH PROPOFOL;  Surgeon: Lucilla Lame, MD;  Location: [facility] ENDOSCOPY;  Service: Endoscopy;  Laterality: N/A;  . ERCP N/A 03/19/2017   Procedure: ENDOSCOPIC RETROGRADE CHOLANGIOPANCREATOGRAPHY (ERCP);  Surgeon: Lucilla Lame, MD;  Location: [facility] ENDOSCOPY;  Service: Endoscopy;  Laterality: N/A;  . ERCP N/A 06/25/2017   Procedure: ENDOSCOPIC RETROGRADE CHOLANGIOPANCREATOGRAPHY (ERCP);  Surgeon: Lucilla Lame, MD;  Location: [facility] ENDOSCOPY;  Service: Endoscopy;  Laterality: N/A;  . ERCP N/A 07/16/2017   Procedure: ENDOSCOPIC RETROGRADE CHOLANGIOPANCREATOGRAPHY (ERCP);  Surgeon: Lucilla Lame, MD;  Location: [facility] ENDOSCOPY;  Service: Endoscopy;  Laterality: N/A;  . none      Prior to Admission medications   Medication Sig Start Date End Date Taking?  Authorizing Provider  bisacodyl (DULCOLAX) 5 MG EC tablet Take 1 tablet (5 mg total) by mouth daily as needed for moderate constipation. 05/22/18   Clapacs, Madie Reno, MD  HYDROmorphone (DILAUDID) 4 MG tablet Take 1 tablet (4 mg total) by mouth every 4 (four) hours as needed for severe pain. 05/25/18   Clapacs, Madie Reno,  MD  lipase/protease/amylase (CREON) 36000 UNITS CPEP capsule Take 1 capsule (36,000 Units total) by mouth 4 (four) times daily. Patient taking differently: Take 144,000 Units by mouth 3 (three) times daily.  05/22/18   Clapacs, Madie Reno, MD  lithium carbonate (ESKALITH) 450 MG CR tablet Take 2 tablets (900 mg total) by mouth at bedtime. 05/22/18   Clapacs, Madie Reno, MD  loratadine (CLARITIN) 10 MG tablet Take 1 tablet (10 mg total) by mouth daily. 05/22/18   Clapacs, Madie Reno, MD  OLANZapine (ZYPREXA) 15 MG tablet Take 1 tablet (15 mg total) by mouth at bedtime. 05/22/18   Clapacs, Madie Reno, MD  senna (SENOKOT) 8.6 MG TABS tablet Take 1 tablet (8.6 mg total) by mouth at bedtime. 05/22/18   Clapacs, Madie Reno, MD  temazepam (RESTORIL) 15 MG capsule Take 1 capsule (15 mg total) by mouth at bedtime as needed for sleep. 05/22/18   Clapacs, Madie Reno, MD  temazepam (RESTORIL) 15 MG capsule Take 1 capsule (15 mg total) by mouth at bedtime. Patient not taking: Reported on 06/06/2018 05/22/18   Clapacs, Madie Reno, MD    Allergies Benztropine and Lamotrigine  Family History  Problem Relation Age of Onset  . Cancer Maternal Aunt   . Breast cancer Maternal Grandmother   . Lung cancer Maternal Grandfather   . Diabetes Mellitus II Mother     Social History Social History   Tobacco Use  . Smoking status: Current Every Day Smoker    Packs/day: 1.00    Years: 18.00    Pack years: 18.00    Types: E-cigarettes  . Smokeless tobacco: Never Used  Substance Use Topics  . Alcohol use: No    Frequency: Never    Comment: Occasional wine about every 3-4 months  . Drug use: Yes    Types: Marijuana    Review of Systems  Constitutional: No fever/chills Eyes: No visual changes. ENT: No sore throat. No stiff neck no neck pain Cardiovascular: Denies chest pain. Respiratory: Denies shortness of breath. Gastrointestinal:   no vomiting.  No diarrhea.  No constipation. Genitourinary: Negative for dysuria. Musculoskeletal:  Negative lower extremity swelling Skin: Negative for rash. Neurological: Negative for severe headaches, focal weakness or numbness.   ____________________________________________   PHYSICAL EXAM:  VITAL SIGNS: ED Triage Vitals [08/11/18 1500]  Enc Vitals Group     BP (!) 91/57     Pulse Rate 86     Resp 16     Temp 97.7 F (36.5 C)     Temp Source Oral     SpO2 99 %     Weight 142 lb (64.4 kg)     Height 5\' 8"  (1.727 m)     Head Circumference      Peak Flow      Pain Score 9     Pain Loc      Pain Edu?      Excl. in [place]?     Constitutional: Alert and oriented. Well appearing and in no acute distress. Eyes: Conjunctivae are normal Head: Atraumatic HEENT: No congestion/rhinnorhea. Mucous membranes are moist.  Oropharynx non-erythematous Neck:   Nontender with no meningismus,  no masses, no stridor Cardiovascular: Normal rate, regular rhythm. Grossly normal heart sounds.  Good peripheral circulation. Respiratory: Normal respiratory effort.  No retractions. Lungs CTAB. Abdominal: Soft and nonsurgical but there is some left-sided abdominal discomfort. No distention. No guarding no rebound Back:  There is no focal tenderness or step off.  there is no midline tenderness there are no lesions noted. there is no CVA tenderness Musculoskeletal: No lower extremity tenderness, no upper extremity tenderness. No joint effusions, no DVT signs strong distal pulses no edema Neurologic:  Normal speech and language. No gross focal neurologic deficits are appreciated.  Skin:  Skin is warm, dry and intact. No rash noted. Psychiatric: Mood and affect are normal. Speech and behavior are normal.  ____________________________________________   LABS (all labs ordered are listed, but only abnormal results are displayed)  Labs Reviewed  LIPASE, BLOOD - Abnormal; Notable for the following components:      Result Value   Lipase 60 (*)    All other components within normal limits  COMPREHENSIVE  METABOLIC PANEL - Abnormal; Notable for the following components:   Glucose, Bld 107 (*)    Creatinine, Ser 0.54 (*)    Calcium 8.6 (*)    Albumin 3.2 (*)    All other components within normal limits  CBC - Abnormal; Notable for the following components:   RBC 2.56 (*)    Hemoglobin 7.7 (*)    HCT 24.6 (*)    RDW 16.4 (*)    All other components within normal limits  URINALYSIS, COMPLETE (UACMP) WITH MICROSCOPIC    Pertinent labs  results that were available during my care of the patient were reviewed by me and considered in my medical decision making (see chart for details). ____________________________________________  EKG  I personally interpreted any EKGs ordered by me or triage  ____________________________________________  RADIOLOGY  Pertinent labs & imaging results that were available during my care of the patient were reviewed by me and considered in my medical decision making (see chart for details). If possible, patient and/or family made aware of any abnormal findings.  Ct Abdomen Pelvis W Contrast  Result Date: 08/11/2018 CLINICAL DATA:  43 year old male with a history of pancreatic cancer currently presenting with abdominal pain and constipation. EXAM: CT ABDOMEN AND PELVIS WITH CONTRAST TECHNIQUE: Multidetector CT imaging of the abdomen and pelvis was performed using the standard protocol following bolus administration of intravenous contrast. CONTRAST:  165mL OMNIPAQUE IOHEXOL 300 MG/ML  SOLN COMPARISON:  Most recent prior CT scan of the abdomen and pelvis 08/13/2017 FINDINGS: Lower chest: The lung bases are clear. Visualized cardiac structures are within normal limits for size. No pericardial effusion. Unremarkable visualized distal thoracic esophagus. Hepatobiliary: Expected pneumobilia with a metal biliary stent in the distal common bile duct extending into the duodenum. The configuration of the stent has changed compared to 08/13/2017. The stent is now relatively  straight rather than curved. The stent is partially opacified by secretions but is likely at least partially patent given the absence of a biliary ductal dilatation and the presence of pneumobilia. The common bile duct diameter is no more than 7 mm. No definite hepatic lesion. Pancreas: Progressive atrophy of the pancreatic body and tail with diffuse dilation of the main pancreatic duct now measuring up to 5 mm in diameter. Small complex low-attenuation collection in the region of the pancreatic uncinate process measures approximately 2.0 x 1.1 cm. This is also adjacent to the distal aspect of the biliary stent. Mild inflammatory stranding surrounds  the entirety of the pancreas. Spleen: Normal in size without focal abnormality. Adrenals/Urinary Tract: Adrenal glands are unremarkable. Kidneys are normal, without renal calculi, focal lesion, or hydronephrosis. Bladder is unremarkable. Stomach/Bowel: No evidence of constipation. The colonic is largely decompressed. However, there are multiple loops of abnormal fluid-filled and hyperenhancing small bowel in the left hemiabdomen without a discrete transition point. Additionally, there appears to be mild hyperenhancement of the duodenum and proximal jejunum without associated dilation. Normal appendix in the right lower quadrant. Vascular/Lymphatic: No significant vascular findings are present. No enlarged abdominal or pelvic lymph nodes. Reproductive: Prostate is unremarkable. Other: No abdominal wall hernia or abnormality. No abdominopelvic ascites. Musculoskeletal: No acute or significant osseous findings. IMPRESSION: 1. Negative for evidence of constipation. The colon is relatively decompressed with minimal stool burden. 2. Interval development of a small complex fluid collection in the region of the pancreatic uncinate process adjacent to the duodenum measures approximately 2.0 x 1.1 cm. This is favored to represent a small pseudocyst. Alternately, a duodenal  ulceration or duodenal diverticulum could have a similar appearance. 3. Progressive atrophy of the pancreatic neck and body with diffuse surrounding inflammatory changes suggesting acute on chronic pancreatitis. 4. Multiple loops of mildly dilated and fluid-filled small bowel are present within the left hemiabdomen without evidence of focal transition point. This may represent infectious gastroenteritis, reactive ileus secondary to underlying pancreatitis or, less likely a partial small bowel obstruction. 5. Expected pneumobilia with biliary stent in place. The configuration of the stent has changed (the stent is now straight versus curved on prior imaging) compared to prior imaging from 08/13/2017. This change in configuration is of uncertain clinical significance. Electronically Signed   By: Jacqulynn Cadet M.D.   On: 08/11/2018 17:33   Dg Abdomen Acute W/chest  Result Date: 08/11/2018 CLINICAL DATA:  Abdominal pain EXAM: DG ABDOMEN ACUTE W/ 1V CHEST COMPARISON:  CT abdomen pelvis 08/13/2017 FINDINGS: Lungs are clear. Normal cardiomediastinal contours. Right chest wall Port-A-Cath tip is at the cavoatrial junction. No free intraperitoneal air. Redemonstration of pneumobilia. There is mildly dilated bowel in the left lower quadrant. Moderate stool volume throughout the colon. IMPRESSION: 1. Normal chest. 2. Moderate stool volume with dilated small bowel in the left lower quadrant. If there is concern for obstruction, CT abdomen pelvis may be helpful. Electronically Signed   By: Ulyses Jarred M.D.   On: 08/11/2018 15:49   ____________________________________________    PROCEDURES  Procedure(s) performed: None  Procedures  Critical Care performed: None  ____________________________________________   INITIAL IMPRESSION / ASSESSMENT AND PLAN / ED COURSE  Pertinent labs & imaging results that were available during my care of the patient were reviewed by me and considered in my medical decision  making (see chart for details).  Patient here with what appears to be chronic abdominal discomfort although I have never personally seen him he does have some tenderness especially left side of his abdomen.  I did do an x-ray to make sure that there is no evidence of perforation or obstruction and there was a questionable obstruction on the x-ray therefore did a CT scan which shows no significant change the extent that I can determine from his last CT scan at [facility].  He is more anemic, this certainly could be because of the chemotherapy there is no evidence of GI bleeding.  He is requested pain medication here which we did give him.  His blood pressure slightly soft after that, but he got fluid and feels better and would like  to go.  He does not want to be here in the hospital because of concerns about the pandemic.  We will however discuss with his oncology group to make sure that they are aware of these findings on their patient and will follow up.  ----------------------------------------- 6:39 PM on 08/11/2018 -----------------------------------------  Able to discuss with his oncologist personally, Dr.Abbruzzese, knows the patient very well.  We discussed all the patient's finding.  He states this is chronic for him.  He does not wish the patient admitted or transferred.  Patient himself declines admission or transfer he wants to go home.  I did discuss with his oncologist his hemoglobin which is somewhat low and his blood pressure which is running low.  He states that he will follow-up on this but he does not think any further intervention is acutely needed.  Patient denies any rectal bleeding and has declined rectal exam.  His blood pressures in the 90s mostly here, it went down a little after his Dilaudid.  Patient prior visits to his oncologist does suggest that this patient's pressures are not infrequently in the 90s to low 100s and he states he feels "fine".  He would like more narcotics.  I will  give him more IV fluids prior to discharge at his request.  And I have shared his CT findings with his oncologist so he does not have to repeat the CT scan which was otherwise scheduled for the next couple days.  He has an appointment tomorrow with his oncologist, and he will follow-up.  I appreciate the consult from [place].  We will try to get the patient home after his fluid and that is done.  Urinalysis is pending.     ____________________________________________   FINAL CLINICAL IMPRESSION(S) / ED DIAGNOSES  Final diagnoses:  None      This chart was dictated using voice recognition software.  Despite best efforts to proofread,  errors can occur which can change meaning.      Schuyler Amor, MD 08/11/18 1816    Schuyler Amor, MD 08/11/18 [phone]

## 2018-09-29 DEATH — deceased

## 2019-03-04 IMAGING — CT CT ABD-PELV W/ CM
2 of 5 series · 15 of 46 positions shown, 17 images · IV contrast (APPLIED)
Comparison: Right upper quadrant ultrasound performed earlier today
at [DATE] a.m.

CLINICAL DATA: Chronic generalized abdominal pain and nausea.
Lightheadedness and generalized weakness. Itchiness.

EXAM:
CT ABDOMEN AND PELVIS WITH CONTRAST
TECHNIQUE: Multidetector CT imaging of the abdomen and pelvis was performed
using the standard protocol following bolus administration of
intravenous contrast.
CONTRAST:  100mL [id number] IOPAMIDOL ([id number]) INJECTION 61%

[Series 2: routine abd/pel with · axial · 0.78mm/px · z∈[+682,+1127]mm · 12 of 99 slices shown, 14 images]
[im 5/99  soft-tissue]
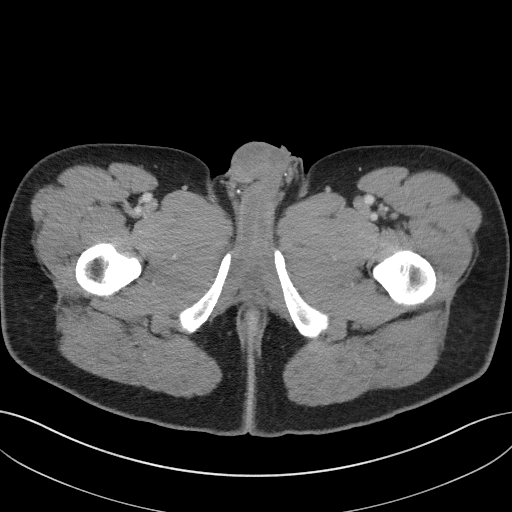
[im 5/99  bone]
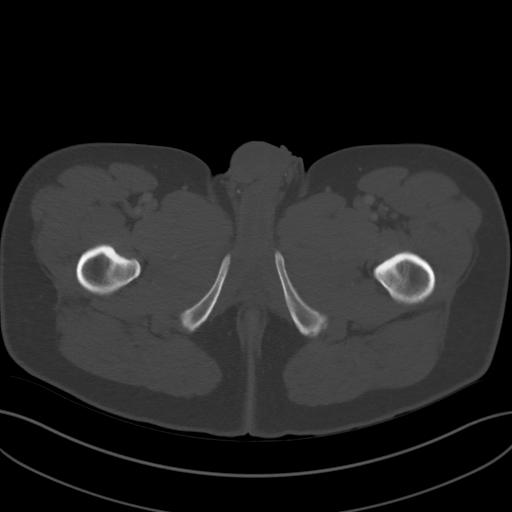
[im 15/99  soft-tissue]
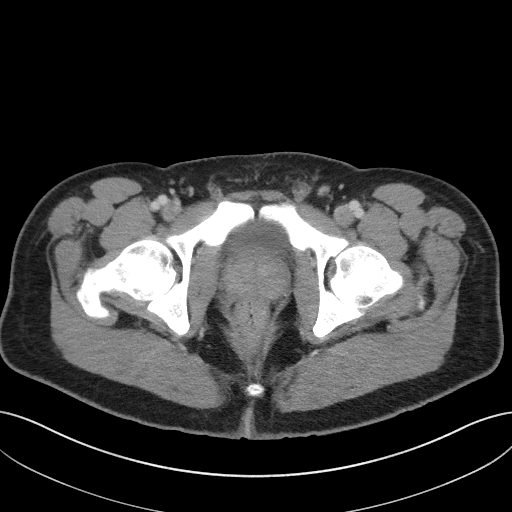
[im 20/99  soft-tissue]
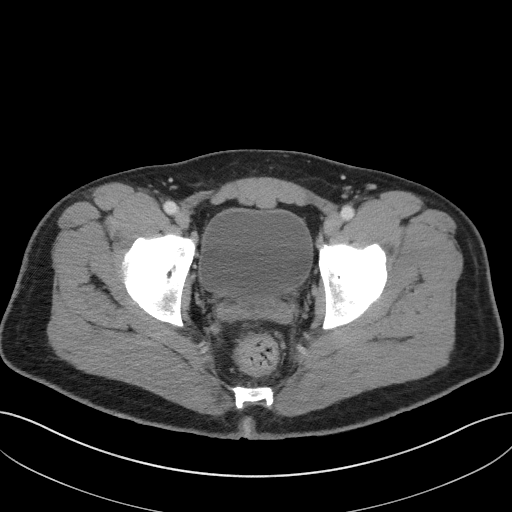
[im 30/99  soft-tissue]
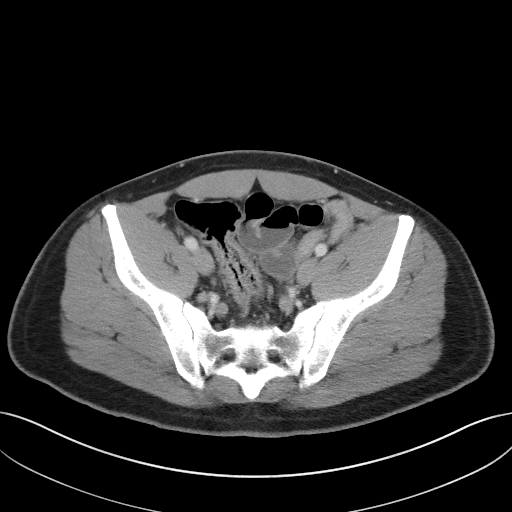
[im 40/99  soft-tissue]
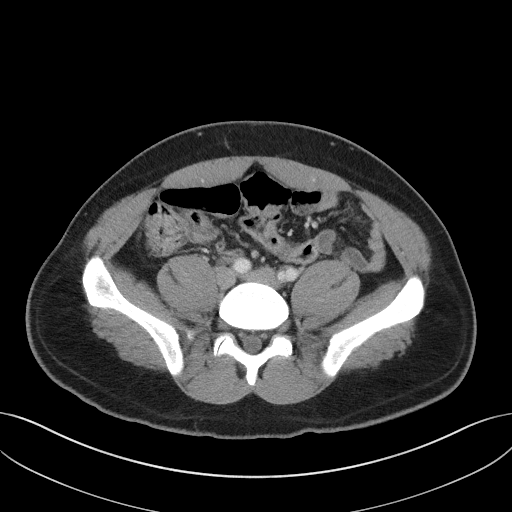
[im 45/99  soft-tissue]
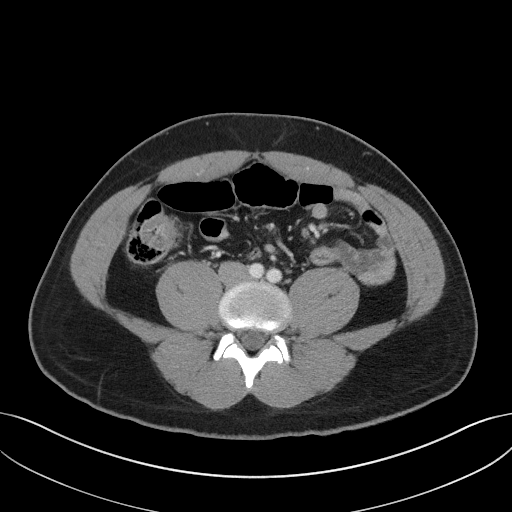
[im 54/99  soft-tissue]
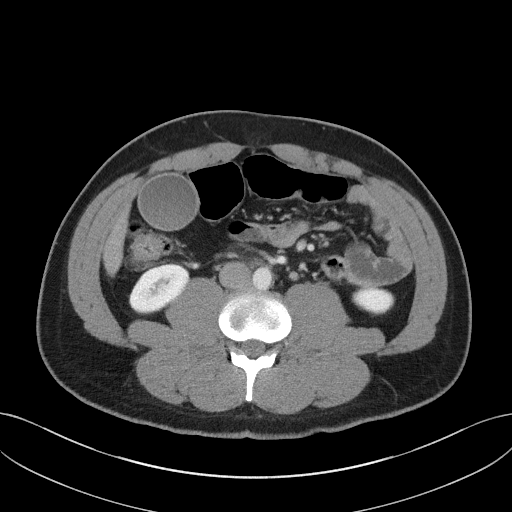
[im 59/99  soft-tissue]
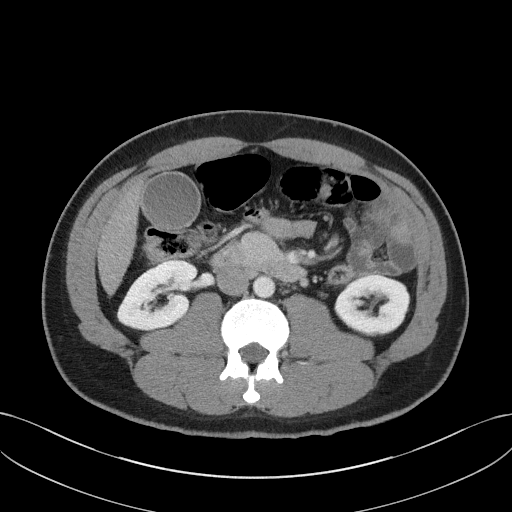
[im 69/99  soft-tissue]
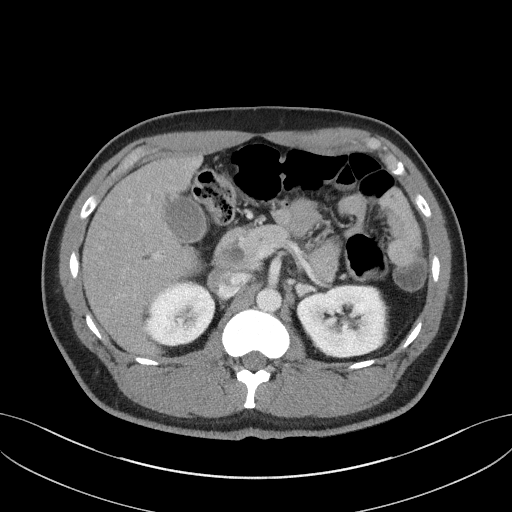
[im 69/99  bone]
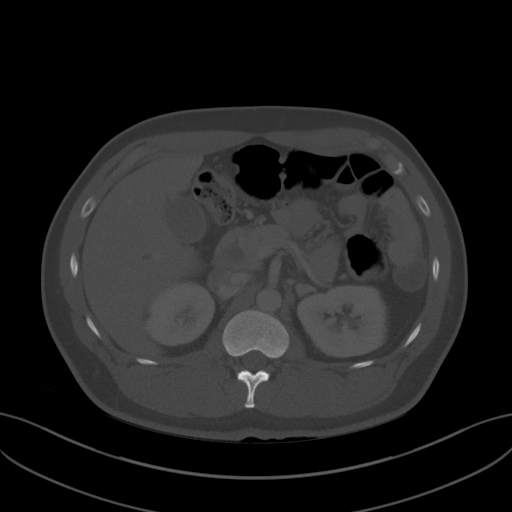
[im 79/99  soft-tissue]
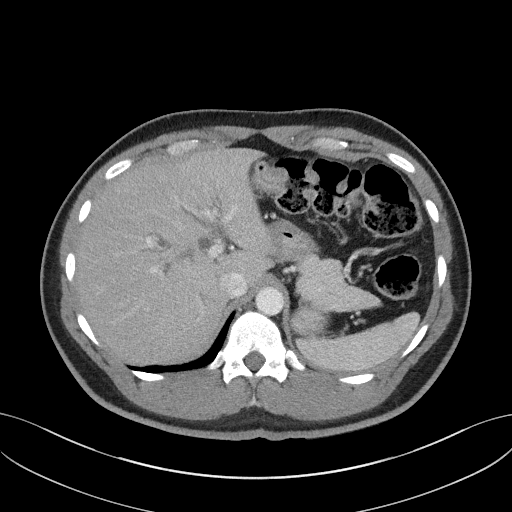
[im 84/99  soft-tissue]
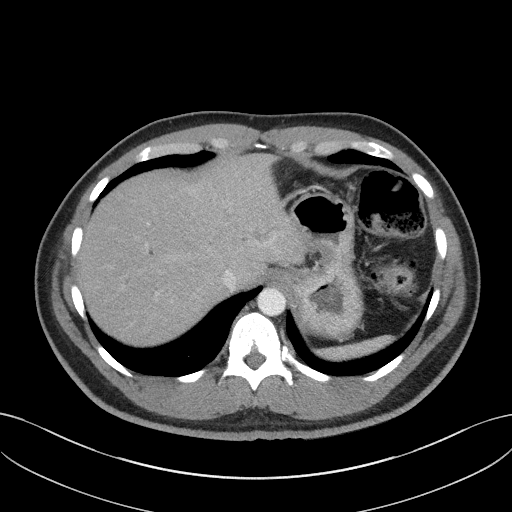
[im 94/99  soft-tissue]
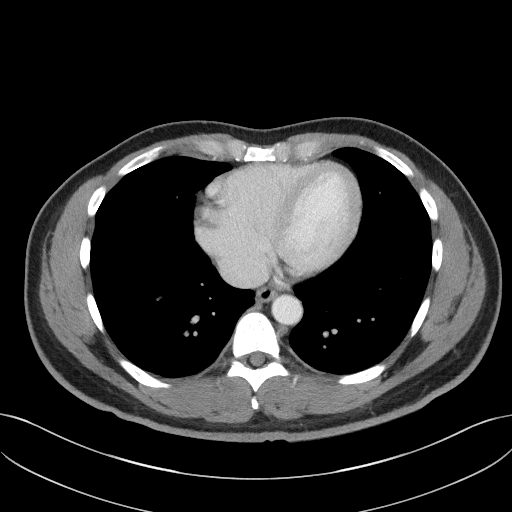

[Series 5: coronal st · coronal · 0.78mm/px · 3 of 86 slices shown]
[im 29/86  soft-tissue]
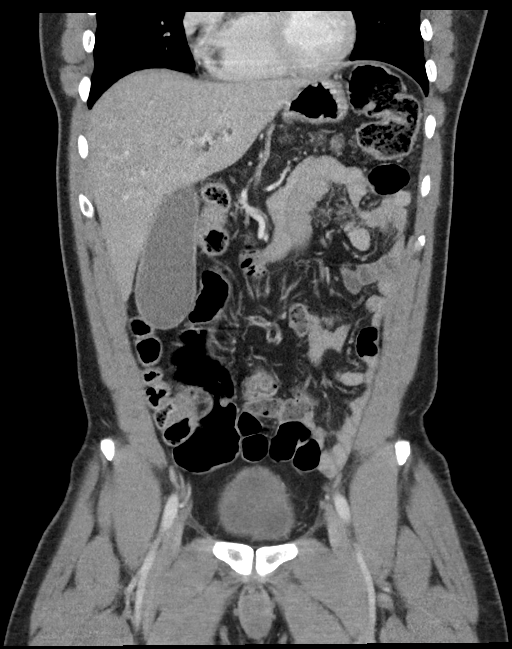
[im 38/86  soft-tissue]
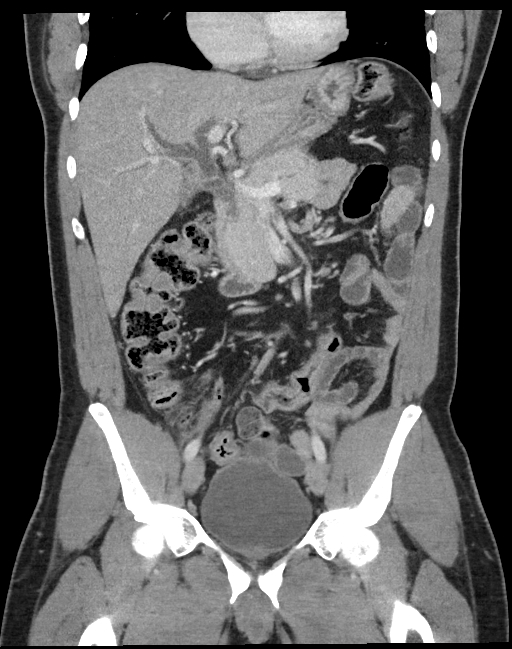
[im 48/86  soft-tissue]
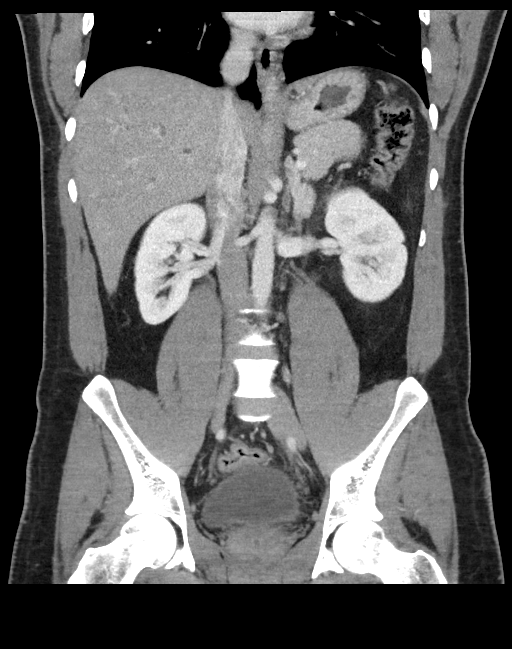

[15 of 46 positions shown; findings below may reference images not displayed]

FINDINGS: Lower chest: The visualized lung bases are grossly clear. The
visualized portions of the mediastinum are unremarkable.

Hepatobiliary: There is dilatation of the common bile duct to 1.5 cm
in diameter, to the level of the pancreatic head. This may reflect
an underlying obstructing stone, though mass cannot be excluded.
There is diffuse prominence of the intrahepatic biliary ducts. The
gallbladder is somewhat distended but otherwise grossly
unremarkable. The liver is otherwise unremarkable in appearance.

Pancreas: The pancreas is otherwise grossly unremarkable.

Spleen: The spleen is unremarkable in appearance.

Adrenals/Urinary Tract: The adrenal glands are unremarkable in
appearance. The kidneys are within normal limits. There is no
evidence of hydronephrosis. No renal or ureteral stones are
identified. No perinephric stranding is seen.

Stomach/Bowel: The stomach is unremarkable in appearance. The small
bowel is within normal limits. The appendix is normal in caliber,
without evidence of appendicitis. The colon is unremarkable in
appearance.

Vascular/Lymphatic: The abdominal aorta is unremarkable in
appearance. The inferior vena cava is grossly unremarkable. No
retroperitoneal lymphadenopathy is seen. No pelvic sidewall
lymphadenopathy is identified.

Reproductive: The bladder is mildly distended and grossly
unremarkable. The prostate is mildly enlarged, measuring 5.1 cm in
transverse dimension.

Other: No additional soft tissue abnormalities are seen.

Musculoskeletal: No acute osseous abnormalities are identified. The
visualized musculature is unremarkable in appearance.
IMPRESSION: 1. Dilatation of the common bile duct to 1.5 cm in diameter, to the
level of the pancreatic head. Diffuse prominence of the intrahepatic
biliary ducts and mild gallbladder distention. This is concerning
for underlying obstructing stone, though underlying mass cannot be
excluded. ERCP or MRCP is recommended for further evaluation.
2. Mildly enlarged prostate.

## 2019-09-09 IMAGING — US US BIOPSY CORE LIVER
1 series · 10 of 10 positions shown · non-contrast
Comparison: CT abdomen and pelvis - 03/03/2017;

INDICATION: Elevated LFTs of uncertain etiology. Please perform
ultrasound-guided random liver biopsy for tissue diagnostic
purposes.

EXAM:
ULTRASOUND GUIDED LIVER BIOPSY

[Series 1: us biopsy core liver · 0.26mm/px · 10 of 10 slices shown]
[im 1/10]
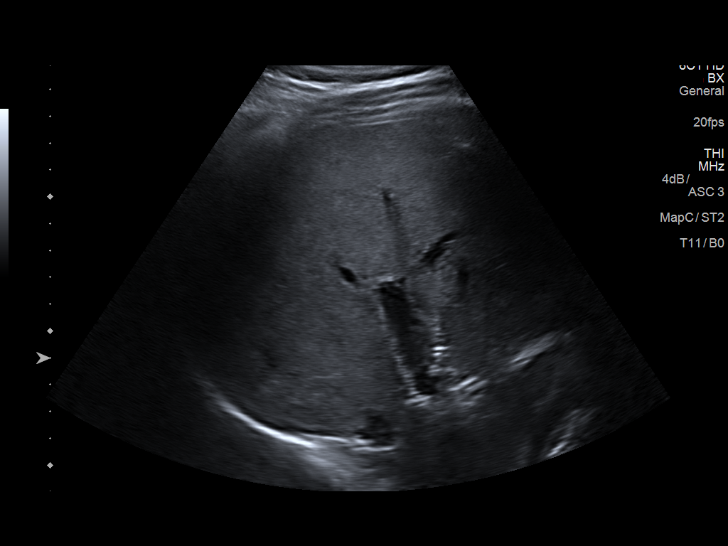
[im 2/10]
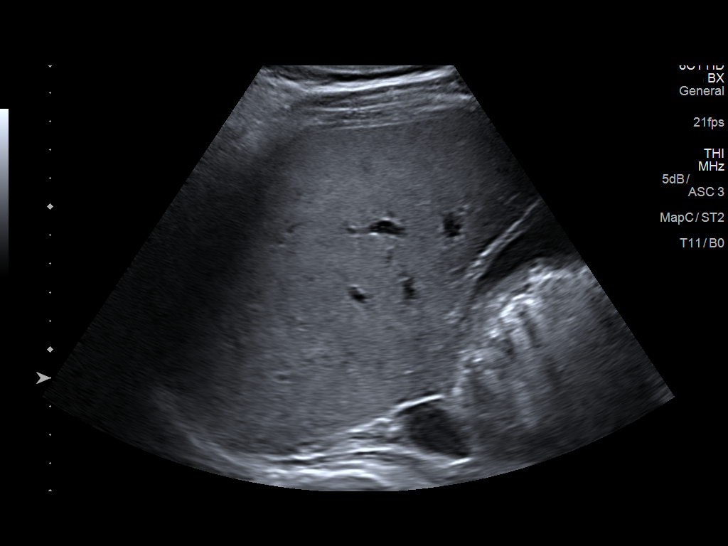
[im 3/10]
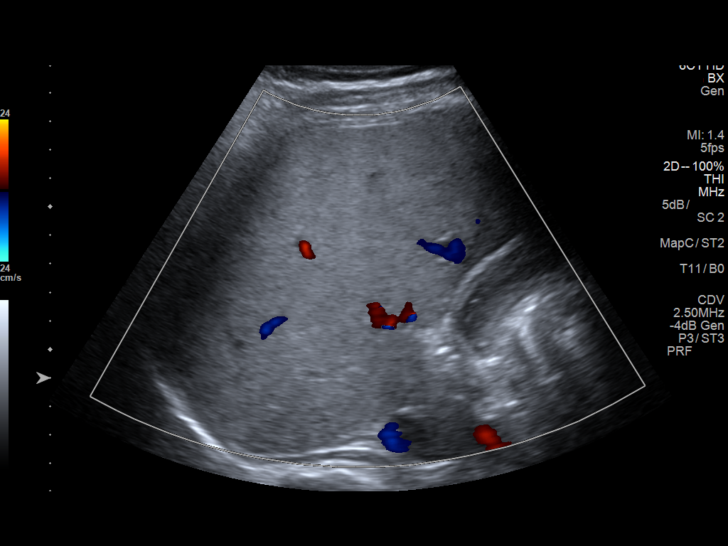
[im 4/10]
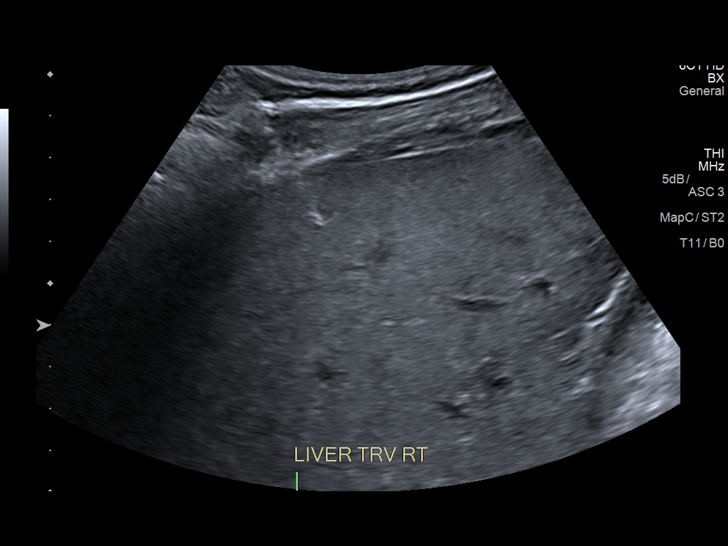
[im 5/10]
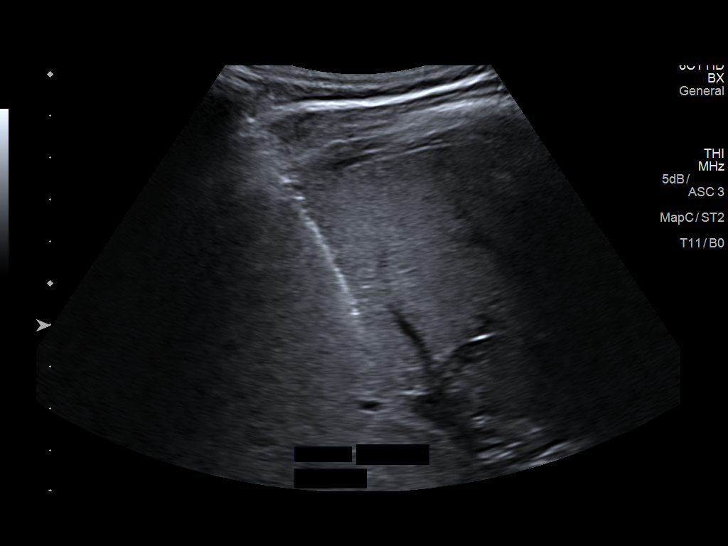
[im 6/10]
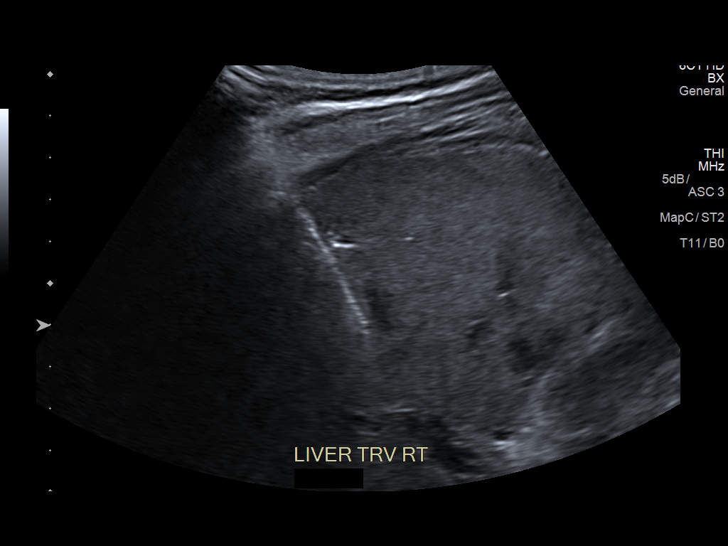
[im 7/10]
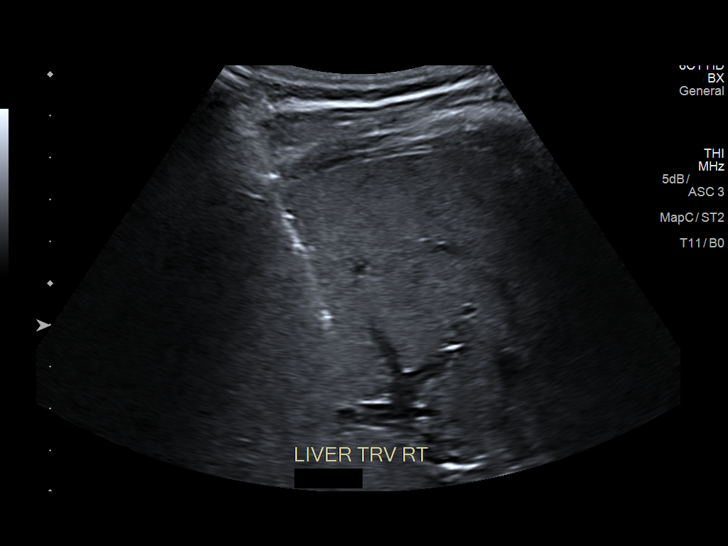
[im 8/10]
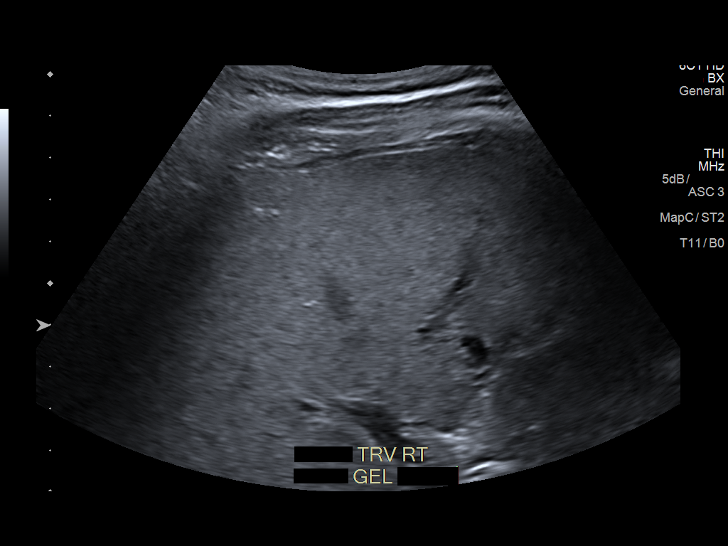
[im 9/10]
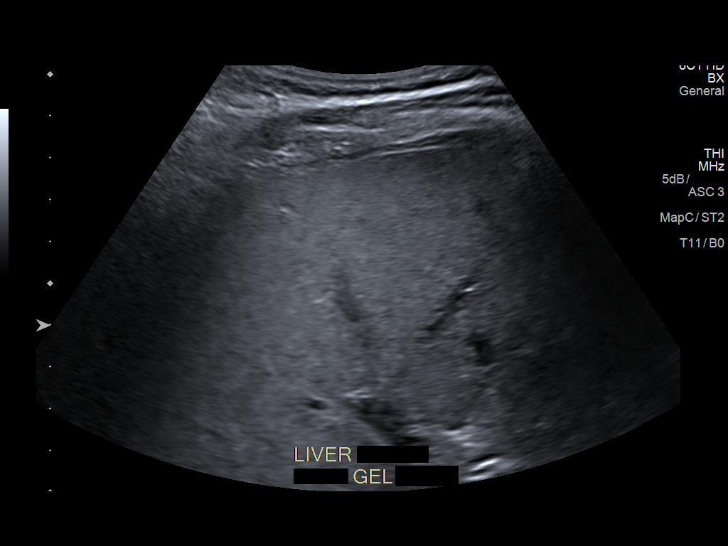
[im 10/10]
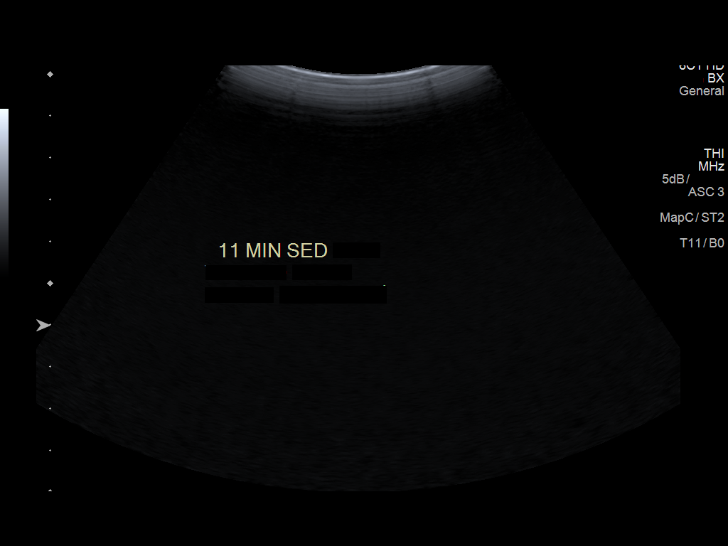

[10 of 10 positions shown; findings below may reference images not displayed]

MRCP - 03/09/2017

MEDICATIONS:
None

ANESTHESIA/SEDATION:
Fentanyl 125 mcg IV; Versed 2.5 mg IV

Total Moderate Sedation time: 11 minutes; The patient was
continuously monitored during the procedure by the interventional
radiology nurse under my direct supervision.

COMPLICATIONS:
None immediate.

PROCEDURE:
Informed written consent was obtained from the patient after a
discussion of the risks, benefits and alternatives to treatment. The
patient understands and consents the procedure. A timeout was
performed prior to the initiation of the procedure.

Ultrasound scanning was performed of the right upper abdominal
quadrant and the procedure was planned. The right upper abdomen was
prepped and draped in the usual sterile fashion. The overlying soft
tissues were anesthetized with 1% lidocaine with epinephrine. A 17
gauge, 6.8 cm co-axial needle was advanced into a peripheral aspect
of the right lobe of the liver and 3 core biopsies were obtained
with an 18 gauge core device under direct ultrasound guidance.

The co-axial needle track was embolized with the administration of a
Gel-Foam slurry. Superficial hemostasis was obtained with manual
compression. Post procedural scanning was negative for definitive
area of hemorrhage. A dressing was placed. The patient tolerated the
procedure well without immediate post procedural complication.
IMPRESSION: Technically successful ultrasound guided liver biopsy.
